# Patient Record
Sex: Female | Born: 1980
Health system: Southern US, Community
[De-identification: ages and names within clinical notes are randomized; demographics above are authoritative.]

## PROBLEM LIST (undated history)

## (undated) DIAGNOSIS — F419 Anxiety disorder, unspecified: Secondary | ICD-10-CM

## (undated) DIAGNOSIS — K8681 Exocrine pancreatic insufficiency: Secondary | ICD-10-CM

## (undated) DIAGNOSIS — M069 Rheumatoid arthritis, unspecified: Secondary | ICD-10-CM

## (undated) DIAGNOSIS — R0602 Shortness of breath: Secondary | ICD-10-CM

## (undated) DIAGNOSIS — E785 Hyperlipidemia, unspecified: Secondary | ICD-10-CM

## (undated) DIAGNOSIS — R5383 Other fatigue: Secondary | ICD-10-CM

## (undated) DIAGNOSIS — M1990 Unspecified osteoarthritis, unspecified site: Secondary | ICD-10-CM

## (undated) DIAGNOSIS — R7303 Prediabetes: Secondary | ICD-10-CM

## (undated) DIAGNOSIS — G4730 Sleep apnea, unspecified: Secondary | ICD-10-CM

## (undated) DIAGNOSIS — D649 Anemia, unspecified: Secondary | ICD-10-CM

## (undated) DIAGNOSIS — E1159 Type 2 diabetes mellitus with other circulatory complications: Secondary | ICD-10-CM

## (undated) DIAGNOSIS — I10 Essential (primary) hypertension: Secondary | ICD-10-CM

## (undated) DIAGNOSIS — M7989 Other specified soft tissue disorders: Secondary | ICD-10-CM

## (undated) DIAGNOSIS — B019 Varicella without complication: Secondary | ICD-10-CM

## (undated) DIAGNOSIS — E559 Vitamin D deficiency, unspecified: Secondary | ICD-10-CM

## (undated) DIAGNOSIS — I152 Hypertension secondary to endocrine disorders: Secondary | ICD-10-CM

## (undated) DIAGNOSIS — F329 Major depressive disorder, single episode, unspecified: Secondary | ICD-10-CM

## (undated) DIAGNOSIS — F32A Depression, unspecified: Secondary | ICD-10-CM

## (undated) DIAGNOSIS — G4733 Obstructive sleep apnea (adult) (pediatric): Secondary | ICD-10-CM

## (undated) DIAGNOSIS — IMO0002 Reserved for concepts with insufficient information to code with codable children: Secondary | ICD-10-CM

## (undated) DIAGNOSIS — M549 Dorsalgia, unspecified: Secondary | ICD-10-CM

## (undated) HISTORY — DX: Rheumatoid arthritis, unspecified: M06.9

## (undated) HISTORY — DX: Depression, unspecified: F32.A

## (undated) HISTORY — PX: OOPHORECTOMY: SHX86

## (undated) HISTORY — DX: Unspecified osteoarthritis, unspecified site: M19.90

## (undated) HISTORY — DX: Shortness of breath: R06.02

## (undated) HISTORY — DX: Prediabetes: R73.03

## (undated) HISTORY — DX: Sleep apnea, unspecified: G47.30

## (undated) HISTORY — DX: Other specified soft tissue disorders: M79.89

## (undated) HISTORY — DX: Anxiety disorder, unspecified: F41.9

## (undated) HISTORY — DX: Obstructive sleep apnea (adult) (pediatric): G47.33

## (undated) HISTORY — DX: Vitamin D deficiency, unspecified: E55.9

## (undated) HISTORY — DX: Other fatigue: R53.83

## (undated) HISTORY — DX: Exocrine pancreatic insufficiency: K86.81

## (undated) HISTORY — DX: Hyperlipidemia, unspecified: E78.5

## (undated) HISTORY — DX: Hypertension secondary to endocrine disorders: I15.2

## (undated) HISTORY — DX: Type 2 diabetes mellitus with other circulatory complications: E11.59

## (undated) HISTORY — PX: ABDOMINAL HYSTERECTOMY: SHX81

## (undated) HISTORY — DX: Anemia, unspecified: D64.9

## (undated) HISTORY — DX: Varicella without complication: B01.9

## (undated) HISTORY — PX: ROUX-EN-Y GASTRIC BYPASS: SHX1104

## (undated) HISTORY — DX: Major depressive disorder, single episode, unspecified: F32.9

## (undated) HISTORY — DX: Dorsalgia, unspecified: M54.9

## (undated) HISTORY — PX: TONSILLECTOMY: SUR1361

---

## 2002-03-21 ENCOUNTER — Encounter: Payer: Self-pay | Admitting: Family Medicine

## 2002-03-21 ENCOUNTER — Ambulatory Visit (HOSPITAL_COMMUNITY): Admission: RE | Admit: 2002-03-21 | Discharge: 2002-03-21 | Payer: Self-pay | Admitting: Family Medicine

## 2004-02-26 ENCOUNTER — Ambulatory Visit: Payer: Self-pay | Admitting: Family Medicine

## 2004-09-20 ENCOUNTER — Emergency Department (HOSPITAL_COMMUNITY): Admission: EM | Admit: 2004-09-20 | Discharge: 2004-09-20 | Payer: Self-pay | Admitting: Emergency Medicine

## 2005-04-09 ENCOUNTER — Emergency Department: Payer: Self-pay | Admitting: Emergency Medicine

## 2005-04-10 ENCOUNTER — Other Ambulatory Visit: Payer: Self-pay

## 2005-04-17 ENCOUNTER — Ambulatory Visit: Payer: Self-pay | Admitting: Internal Medicine

## 2006-09-28 ENCOUNTER — Emergency Department: Payer: Self-pay | Admitting: Unknown Physician Specialty

## 2008-04-22 ENCOUNTER — Ambulatory Visit: Payer: Self-pay | Admitting: Surgery

## 2010-03-25 ENCOUNTER — Ambulatory Visit: Payer: Self-pay | Admitting: Internal Medicine

## 2010-03-29 ENCOUNTER — Encounter (HOSPITAL_COMMUNITY): Payer: Self-pay | Admitting: Radiology

## 2010-03-29 ENCOUNTER — Emergency Department (HOSPITAL_COMMUNITY): Payer: 59

## 2010-03-29 ENCOUNTER — Emergency Department (HOSPITAL_COMMUNITY)
Admission: EM | Admit: 2010-03-29 | Discharge: 2010-03-30 | Disposition: A | Discharge status: Home / Self Care | Payer: 59 | Attending: Emergency Medicine | Admitting: Emergency Medicine

## 2010-03-29 DIAGNOSIS — E876 Hypokalemia: Secondary | ICD-10-CM | POA: Insufficient documentation

## 2010-03-29 DIAGNOSIS — R197 Diarrhea, unspecified: Secondary | ICD-10-CM | POA: Insufficient documentation

## 2010-03-29 DIAGNOSIS — I517 Cardiomegaly: Secondary | ICD-10-CM | POA: Insufficient documentation

## 2010-03-29 DIAGNOSIS — R1011 Right upper quadrant pain: Secondary | ICD-10-CM | POA: Insufficient documentation

## 2010-03-29 DIAGNOSIS — R110 Nausea: Secondary | ICD-10-CM | POA: Insufficient documentation

## 2010-03-29 DIAGNOSIS — R1909 Other intra-abdominal and pelvic swelling, mass and lump: Secondary | ICD-10-CM | POA: Insufficient documentation

## 2010-03-29 DIAGNOSIS — R109 Unspecified abdominal pain: Secondary | ICD-10-CM | POA: Insufficient documentation

## 2010-03-29 DIAGNOSIS — Z9884 Bariatric surgery status: Secondary | ICD-10-CM | POA: Insufficient documentation

## 2010-03-29 DIAGNOSIS — K5900 Constipation, unspecified: Secondary | ICD-10-CM | POA: Insufficient documentation

## 2010-03-29 HISTORY — DX: Reserved for concepts with insufficient information to code with codable children: IMO0002

## 2010-03-29 LAB — CBC
HCT: 24.2 % — ABNORMAL LOW (ref 36.0–46.0)
Hemoglobin: 6.8 g/dL — CL (ref 12.0–15.0)
MCH: 15.9 pg — ABNORMAL LOW (ref 26.0–34.0)
MCHC: 28.1 g/dL — ABNORMAL LOW (ref 30.0–36.0)
MCV: 56.7 fL — ABNORMAL LOW (ref 78.0–100.0)
Platelets: 316 10*3/uL (ref 150–400)
RBC: 4.27 MIL/uL (ref 3.87–5.11)
RDW: 23.6 % — ABNORMAL HIGH (ref 11.5–15.5)

## 2010-03-29 LAB — DIFFERENTIAL
Basophils Absolute: 0 10*3/uL (ref 0.0–0.1)
Basophils Relative: 0 % (ref 0–1)
Eosinophils Absolute: 0 10*3/uL (ref 0.0–0.7)
Lymphocytes Relative: 18 % (ref 12–46)
Lymphs Abs: 1.6 10*3/uL (ref 0.7–4.0)
Monocytes Absolute: 1.1 10*3/uL — ABNORMAL HIGH (ref 0.1–1.0)
Monocytes Relative: 13 % — ABNORMAL HIGH (ref 3–12)
Neutro Abs: 6.1 10*3/uL (ref 1.7–7.7)
Neutrophils Relative %: 69 % (ref 43–77)

## 2010-03-29 LAB — URINALYSIS, ROUTINE W REFLEX MICROSCOPIC
Glucose, UA: NEGATIVE mg/dL
Ketones, ur: >80 mg/dL — AB
Nitrite: POSITIVE — AB
Protein, ur: NEGATIVE mg/dL

## 2010-03-29 LAB — COMPREHENSIVE METABOLIC PANEL
Albumin: 3.7 g/dL (ref 3.5–5.2)
Alkaline Phosphatase: 52 U/L (ref 39–117)
BUN: 6 mg/dL (ref 6–23)
Chloride: 103 mEq/L (ref 96–112)
Potassium: 2.7 mEq/L — CL (ref 3.5–5.1)
Total Bilirubin: 0.8 mg/dL (ref 0.3–1.2)

## 2010-03-29 LAB — POCT PREGNANCY, URINE: Preg Test, Ur: NEGATIVE

## 2010-03-29 LAB — URINE MICROSCOPIC-ADD ON

## 2010-04-10 ENCOUNTER — Ambulatory Visit: Payer: Self-pay | Admitting: Internal Medicine

## 2010-05-10 ENCOUNTER — Ambulatory Visit: Payer: Self-pay | Admitting: Internal Medicine

## 2010-06-10 ENCOUNTER — Ambulatory Visit: Payer: Self-pay | Admitting: Internal Medicine

## 2010-06-20 ENCOUNTER — Inpatient Hospital Stay (HOSPITAL_COMMUNITY): Payer: 59

## 2010-06-20 ENCOUNTER — Inpatient Hospital Stay (HOSPITAL_COMMUNITY)
Admission: AD | Admit: 2010-06-20 | Discharge: 2010-06-20 | Disposition: A | Discharge status: Home / Self Care | Payer: 59 | Source: Ambulatory Visit | Attending: Obstetrics & Gynecology | Admitting: Obstetrics & Gynecology

## 2010-06-20 DIAGNOSIS — O34599 Maternal care for other abnormalities of gravid uterus, unspecified trimester: Secondary | ICD-10-CM | POA: Insufficient documentation

## 2010-06-20 DIAGNOSIS — O10019 Pre-existing essential hypertension complicating pregnancy, unspecified trimester: Secondary | ICD-10-CM | POA: Insufficient documentation

## 2010-06-20 DIAGNOSIS — O209 Hemorrhage in early pregnancy, unspecified: Secondary | ICD-10-CM | POA: Insufficient documentation

## 2010-06-20 DIAGNOSIS — N83209 Unspecified ovarian cyst, unspecified side: Secondary | ICD-10-CM | POA: Insufficient documentation

## 2010-06-20 LAB — WET PREP, GENITAL

## 2010-06-20 LAB — CBC
HCT: 35 % — ABNORMAL LOW (ref 36.0–46.0)
Hemoglobin: 10.8 g/dL — ABNORMAL LOW (ref 12.0–15.0)
RBC: 4.89 MIL/uL (ref 3.87–5.11)
WBC: 6.2 10*3/uL (ref 4.0–10.5)

## 2010-06-20 LAB — ABO/RH: ABO/RH(D): O POS

## 2010-06-20 LAB — POCT PREGNANCY, URINE: Preg Test, Ur: POSITIVE

## 2010-06-21 LAB — GC/CHLAMYDIA PROBE AMP, GENITAL
Chlamydia, DNA Probe: NEGATIVE
GC Probe Amp, Genital: NEGATIVE

## 2010-06-23 ENCOUNTER — Inpatient Hospital Stay (HOSPITAL_COMMUNITY)
Admission: AD | Admit: 2010-06-23 | Discharge: 2010-06-23 | Disposition: A | Discharge status: Home / Self Care | Payer: 59 | Source: Ambulatory Visit | Attending: Family Medicine | Admitting: Family Medicine

## 2010-06-23 DIAGNOSIS — O10019 Pre-existing essential hypertension complicating pregnancy, unspecified trimester: Secondary | ICD-10-CM | POA: Insufficient documentation

## 2010-06-23 DIAGNOSIS — O209 Hemorrhage in early pregnancy, unspecified: Secondary | ICD-10-CM

## 2010-06-23 LAB — HCG, QUANTITATIVE, PREGNANCY: hCG, Beta Chain, Quant, S: 5374 m[IU]/mL — ABNORMAL HIGH (ref <5)

## 2010-07-10 ENCOUNTER — Ambulatory Visit: Payer: Self-pay | Admitting: Internal Medicine

## 2010-07-15 ENCOUNTER — Ambulatory Visit (HOSPITAL_COMMUNITY)
Admission: RE | Admit: 2010-07-15 | Discharge: 2010-07-15 | Disposition: A | Discharge status: Home / Self Care | Payer: 59 | Source: Ambulatory Visit | Attending: Obstetrics and Gynecology | Admitting: Obstetrics and Gynecology

## 2010-07-15 ENCOUNTER — Other Ambulatory Visit: Payer: Self-pay | Admitting: Obstetrics and Gynecology

## 2010-07-15 DIAGNOSIS — O021 Missed abortion: Secondary | ICD-10-CM | POA: Insufficient documentation

## 2010-07-15 LAB — DIFFERENTIAL
Basophils Relative: 0 % (ref 0–1)
Eosinophils Absolute: 0 10*3/uL (ref 0.0–0.7)
Neutrophils Relative %: 48 % (ref 43–77)

## 2010-07-15 LAB — COMPREHENSIVE METABOLIC PANEL
ALT: 13 U/L (ref 0–35)
AST: 10 U/L (ref 0–37)
Albumin: 3.3 g/dL — ABNORMAL LOW (ref 3.5–5.2)
CO2: 28 mEq/L (ref 19–32)
Calcium: 8.6 mg/dL (ref 8.4–10.5)
Chloride: 102 mEq/L (ref 96–112)
GFR calc non Af Amer: >60 mL/min (ref >60)
Sodium: 137 mEq/L (ref 135–145)
Total Bilirubin: 0.3 mg/dL (ref 0.3–1.2)

## 2010-07-15 LAB — CBC
Platelets: 152 10*3/uL (ref 150–400)
RBC: 4.6 MIL/uL (ref 3.87–5.11)
RDW: 18.6 % — ABNORMAL HIGH (ref 11.5–15.5)
WBC: 4.4 10*3/uL (ref 4.0–10.5)

## 2010-08-10 ENCOUNTER — Ambulatory Visit: Payer: Self-pay | Admitting: Internal Medicine

## 2010-08-10 DEATH — deceased

## 2010-08-11 NOTE — Op Note (Signed)
Maria Quinn, Maria Quinn                ACCOUNT NO.:  [id number]  MEDICAL RECORD NO.:  [id number]  LOCATION:  [facility]                          [facility]ACILITY:  [facility]  PHYSICIAN:  Malachi Pro. Ambrose Mantle, M.D. DATE OF BIRTH:  Oct 29, 1980  DATE OF PROCEDURE: DATE OF DISCHARGE:                              OPERATIVE REPORT   PREOPERATIVE DIAGNOSIS:  Nonviable intrauterine pregnancy.  POSTOPERATIVE DIAGNOSIS:  Nonviable intrauterine pregnancy.  OPERATION:  Suction D and C.  OPERATOR:  Malachi Pro. Ambrose Mantle, MD  ANESTHESIA:  MAC anesthesia with local to the cervicovaginal junction.  This patient had an intrauterine gestational sac of 5 weeks' on June 20, 2010.  Subsequent ultrasounds showed and embryo at 6 weeks or 6 weeks and 2 days and on the day prior to admission, July 14, 2010, the embryo still measured 6 weeks and 2 days.  No fetal heart rate.  The patient was brought to the operating room and placed under MAC anesthesia.  She was placed in the [place] stirrups in the semi-lithotomy position.  The vagina was prepped with Betadine solution as well as the medial thighs and perineum and pubic area.  The bladder was emptied with a [place] catheter after the urethra was prepped.  Cervix was visualized, grasped with a single-tooth tenaculum and sounded to 10 cm markedly anteriorly. Exam had shown the uterus to be markedly anterior, 8-9 weeks' size.  The adnexa were free of masses.  The cervical canal was then dilated progressively up to a 27 Pratt dilator and then the #8 curved suction curette was inserted into the endometrial cavity and a suction D and C was done.  The tissue was primarily on the anterior wall of the uterus, failed to receive any more tissue through the suction.  I used sharp instruments to try to curette the walls to make sure there was smooth. The uterus was so anteriorly placed that it was somewhat difficult to get the instruments in and scrape.  So, I did a couple more suction circuits with  the #8 curved suction curette, received no more tissue, and the procedure was terminated.  The patient seemed to tolerate the procedure well.  Blood loss was less than 100 mL.  Most of the blood loss actually came from the injection sites that I used for 1% Xylocaine at the cervicovaginal junction, so the blood loss from the procedure was actually very minimal.  The patient's blood type is O positive and she was returned to recovery in satisfactory condition.     Malachi Pro. Ambrose Mantle, M.D.     TFH/MEDQ  D:  07/15/2010  T:  07/15/2010  Job:  [id number]  Electronically Signed by Tracey Harries M.D. on 08/11/2010 08:19:00 AM

## 2010-09-10 ENCOUNTER — Ambulatory Visit: Payer: Self-pay | Admitting: Internal Medicine

## 2010-10-10 ENCOUNTER — Ambulatory Visit: Payer: Self-pay | Admitting: Internal Medicine

## 2010-10-19 ENCOUNTER — Emergency Department (HOSPITAL_COMMUNITY): Payer: 59

## 2010-10-19 ENCOUNTER — Encounter (HOSPITAL_COMMUNITY): Payer: Self-pay

## 2010-10-19 ENCOUNTER — Inpatient Hospital Stay (HOSPITAL_COMMUNITY)
Admission: EM | Admit: 2010-10-19 | Discharge: 2010-11-07 | DRG: 854 | Disposition: A | Discharge status: Home Health | Payer: 59 | Attending: Obstetrics and Gynecology | Admitting: Obstetrics and Gynecology

## 2010-10-19 DIAGNOSIS — E876 Hypokalemia: Secondary | ICD-10-CM | POA: Diagnosis not present

## 2010-10-19 DIAGNOSIS — D509 Iron deficiency anemia, unspecified: Secondary | ICD-10-CM | POA: Diagnosis present

## 2010-10-19 DIAGNOSIS — R188 Other ascites: Secondary | ICD-10-CM | POA: Diagnosis present

## 2010-10-19 DIAGNOSIS — A419 Sepsis, unspecified organism: Principal | ICD-10-CM | POA: Diagnosis present

## 2010-10-19 DIAGNOSIS — R0902 Hypoxemia: Secondary | ICD-10-CM | POA: Diagnosis present

## 2010-10-19 DIAGNOSIS — I10 Essential (primary) hypertension: Secondary | ICD-10-CM | POA: Diagnosis present

## 2010-10-19 DIAGNOSIS — N7093 Salpingitis and oophoritis, unspecified: Secondary | ICD-10-CM | POA: Diagnosis present

## 2010-10-19 DIAGNOSIS — N731 Chronic parametritis and pelvic cellulitis: Secondary | ICD-10-CM | POA: Diagnosis present

## 2010-10-19 DIAGNOSIS — N839 Noninflammatory disorder of ovary, fallopian tube and broad ligament, unspecified: Secondary | ICD-10-CM | POA: Diagnosis present

## 2010-10-19 DIAGNOSIS — N179 Acute kidney failure, unspecified: Secondary | ICD-10-CM | POA: Diagnosis not present

## 2010-10-19 DIAGNOSIS — R6520 Severe sepsis without septic shock: Secondary | ICD-10-CM | POA: Diagnosis present

## 2010-10-19 DIAGNOSIS — K560 Paralytic ileus: Secondary | ICD-10-CM | POA: Diagnosis present

## 2010-10-19 HISTORY — DX: Essential (primary) hypertension: I10

## 2010-10-19 LAB — URINE MICROSCOPIC-ADD ON

## 2010-10-19 LAB — DIFFERENTIAL
Basophils Absolute: 0 10*3/uL (ref 0.0–0.1)
Basophils Relative: 0 % (ref 0–1)
Lymphocytes Relative: 31 % (ref 12–46)
Lymphs Abs: 2.8 10*3/uL (ref 0.7–4.0)
Monocytes Relative: 8 % (ref 3–12)
Neutro Abs: 5.5 10*3/uL (ref 1.7–7.7)

## 2010-10-19 LAB — CBC
HCT: 33.8 % — ABNORMAL LOW (ref 36.0–46.0)
Hemoglobin: 10.3 g/dL — ABNORMAL LOW (ref 12.0–15.0)
MCH: 22.6 pg — ABNORMAL LOW (ref 26.0–34.0)
MCHC: 30.5 g/dL (ref 30.0–36.0)
MCV: 74.1 fL — ABNORMAL LOW (ref 78.0–100.0)
RBC: 4.56 MIL/uL (ref 3.87–5.11)

## 2010-10-19 LAB — URINALYSIS, ROUTINE W REFLEX MICROSCOPIC
Glucose, UA: NEGATIVE mg/dL
Ketones, ur: NEGATIVE mg/dL
Specific Gravity, Urine: 1.026 (ref 1.005–1.030)
pH: 5.5 (ref 5.0–8.0)

## 2010-10-19 LAB — SAMPLE TO BLOOD BANK

## 2010-10-19 LAB — COMPREHENSIVE METABOLIC PANEL
AST: 10 U/L (ref 0–37)
Albumin: 2.7 g/dL — ABNORMAL LOW (ref 3.5–5.2)
BUN: 9 mg/dL (ref 6–23)
Calcium: 8.9 mg/dL (ref 8.4–10.5)
Chloride: 103 mEq/L (ref 96–112)
Creatinine, Ser: 0.66 mg/dL (ref 0.50–1.10)
Total Protein: 8 g/dL (ref 6.0–8.3)

## 2010-10-19 LAB — POCT I-STAT, CHEM 8
BUN: 7 mg/dL (ref 6–23)
Creatinine, Ser: 0.7 mg/dL (ref 0.50–1.10)
Glucose, Bld: 130 mg/dL — ABNORMAL HIGH (ref 70–99)
Hemoglobin: 11.2 g/dL — ABNORMAL LOW (ref 12.0–15.0)
Potassium: 3 mEq/L — ABNORMAL LOW (ref 3.5–5.1)

## 2010-10-19 LAB — WET PREP, GENITAL
Clue Cells Wet Prep HPF POC: NONE SEEN
Trich, Wet Prep: NONE SEEN

## 2010-10-19 LAB — LIPASE, BLOOD: Lipase: 10 U/L — ABNORMAL LOW (ref 11–59)

## 2010-10-19 MED ORDER — IOHEXOL 300 MG/ML  SOLN
100.0000 mL | Freq: Once | INTRAMUSCULAR | Status: AC | PRN
  Administered 2010-10-19: 100 mL via INTRAVENOUS

## 2010-10-20 LAB — URINE CULTURE
Colony Count: NO GROWTH
Culture: NO GROWTH

## 2010-10-20 LAB — DIFFERENTIAL
Basophils Absolute: 0 10*3/uL (ref 0.0–0.1)
Basophils Relative: 0 % (ref 0–1)
Basophils Relative: 0 % (ref 0–1)
Eosinophils Absolute: 0 10*3/uL (ref 0.0–0.7)
Eosinophils Relative: 0 % (ref 0–5)
Lymphocytes Relative: 4 % — ABNORMAL LOW (ref 12–46)
Lymphs Abs: 1.1 10*3/uL (ref 0.7–4.0)
Neutro Abs: 20.5 10*3/uL — ABNORMAL HIGH (ref 1.7–7.7)
Neutro Abs: 22.3 10*3/uL — ABNORMAL HIGH (ref 1.7–7.7)
Neutrophils Relative %: 91 % — ABNORMAL HIGH (ref 43–77)

## 2010-10-20 LAB — CBC
HCT: 30.8 % — ABNORMAL LOW (ref 36.0–46.0)
HCT: 31.8 % — ABNORMAL LOW (ref 36.0–46.0)
Hemoglobin: 9.7 g/dL — ABNORMAL LOW (ref 12.0–15.0)
MCH: 22.2 pg — ABNORMAL LOW (ref 26.0–34.0)
MCV: 73.5 fL — ABNORMAL LOW (ref 78.0–100.0)
MCV: 73.1 fL — ABNORMAL LOW (ref 78.0–100.0)
Platelets: 394 10*3/uL (ref 150–400)
RBC: 4.19 MIL/uL (ref 3.87–5.11)
RBC: 4.35 MIL/uL (ref 3.87–5.11)
RDW: 17.8 % — ABNORMAL HIGH (ref 11.5–15.5)
WBC: 22.5 10*3/uL — ABNORMAL HIGH (ref 4.0–10.5)
WBC: 24.5 10*3/uL — ABNORMAL HIGH (ref 4.0–10.5)

## 2010-10-20 LAB — COMPREHENSIVE METABOLIC PANEL
ALT: <5 U/L (ref 0–35)
Alkaline Phosphatase: 55 U/L (ref 39–117)
BUN: 20 mg/dL (ref 6–23)
CO2: 23 mEq/L (ref 19–32)
Chloride: 103 mEq/L (ref 96–112)
GFR calc Af Amer: 58 mL/min — ABNORMAL LOW (ref >90)
GFR calc non Af Amer: 50 mL/min — ABNORMAL LOW (ref >90)
Glucose, Bld: 109 mg/dL — ABNORMAL HIGH (ref 70–99)
Potassium: 3.9 mEq/L (ref 3.5–5.1)
Sodium: 136 mEq/L (ref 135–145)
Total Bilirubin: 0.3 mg/dL (ref 0.3–1.2)
Total Protein: 7.5 g/dL (ref 6.0–8.3)

## 2010-10-20 LAB — BASIC METABOLIC PANEL
BUN: 13 mg/dL (ref 6–23)
CO2: 25 mEq/L (ref 19–32)
Calcium: 8.6 mg/dL (ref 8.4–10.5)
Chloride: 105 mEq/L (ref 96–112)
Creatinine, Ser: 0.99 mg/dL (ref 0.50–1.10)

## 2010-10-20 LAB — MAGNESIUM: Magnesium: 2 mg/dL (ref 1.5–2.5)

## 2010-10-21 ENCOUNTER — Inpatient Hospital Stay (HOSPITAL_COMMUNITY): Payer: 59

## 2010-10-21 DIAGNOSIS — N7093 Salpingitis and oophoritis, unspecified: Secondary | ICD-10-CM

## 2010-10-21 LAB — COMPREHENSIVE METABOLIC PANEL
ALT: <5 U/L (ref 0–35)
AST: 10 U/L (ref 0–37)
Alkaline Phosphatase: 56 U/L (ref 39–117)
CO2: 24 mEq/L (ref 19–32)
GFR calc Af Amer: 50 mL/min — ABNORMAL LOW (ref >90)
Glucose, Bld: 96 mg/dL (ref 70–99)
Potassium: 3.9 mEq/L (ref 3.5–5.1)
Sodium: 140 mEq/L (ref 135–145)
Total Protein: 6.9 g/dL (ref 6.0–8.3)

## 2010-10-21 LAB — APTT: aPTT: 35 seconds (ref 24–37)

## 2010-10-21 LAB — PROTIME-INR
Prothrombin Time: 21.5 seconds — ABNORMAL HIGH (ref 11.6–15.2)
Prothrombin Time: 21.6 seconds — ABNORMAL HIGH (ref 11.6–15.2)

## 2010-10-21 LAB — DIFFERENTIAL
Basophils Absolute: 0 10*3/uL (ref 0.0–0.1)
Eosinophils Absolute: 0 10*3/uL (ref 0.0–0.7)
Lymphocytes Relative: 3 % — ABNORMAL LOW (ref 12–46)
Lymphs Abs: 0.8 10*3/uL (ref 0.7–4.0)
Neutro Abs: 23.9 10*3/uL — ABNORMAL HIGH (ref 1.7–7.7)

## 2010-10-21 LAB — CBC
MCH: 22.2 pg — ABNORMAL LOW (ref 26.0–34.0)
MCV: 73.4 fL — ABNORMAL LOW (ref 78.0–100.0)
Platelets: 292 10*3/uL (ref 150–400)
RDW: 17.7 % — ABNORMAL HIGH (ref 11.5–15.5)
WBC: 26 10*3/uL — ABNORMAL HIGH (ref 4.0–10.5)

## 2010-10-21 LAB — ABO/RH: ABO/RH(D): O POS

## 2010-10-21 LAB — TYPE AND SCREEN: Antibody Screen: NEGATIVE

## 2010-10-22 ENCOUNTER — Inpatient Hospital Stay (HOSPITAL_COMMUNITY): Payer: 59

## 2010-10-22 LAB — PREPARE FRESH FROZEN PLASMA: Unit division: 0

## 2010-10-22 LAB — CBC
Hemoglobin: 8.1 g/dL — ABNORMAL LOW (ref 12.0–15.0)
MCH: 22.6 pg — ABNORMAL LOW (ref 26.0–34.0)
RBC: 3.59 MIL/uL — ABNORMAL LOW (ref 3.87–5.11)
RDW: 17.8 % — ABNORMAL HIGH (ref 11.5–15.5)
WBC: 17.2 10*3/uL — ABNORMAL HIGH (ref 4.0–10.5)

## 2010-10-22 LAB — PROTIME-INR
INR: 1.68 — ABNORMAL HIGH (ref 0.00–1.49)
Prothrombin Time: 20.1 seconds — ABNORMAL HIGH (ref 11.6–15.2)

## 2010-10-22 LAB — COMPREHENSIVE METABOLIC PANEL
Albumin: 2.2 g/dL — ABNORMAL LOW (ref 3.5–5.2)
BUN: 16 mg/dL (ref 6–23)
Chloride: 108 mEq/L (ref 96–112)
Creatinine, Ser: 0.91 mg/dL (ref 0.50–1.10)
Total Bilirubin: 0.2 mg/dL — ABNORMAL LOW (ref 0.3–1.2)
Total Protein: 6.7 g/dL (ref 6.0–8.3)

## 2010-10-22 LAB — DIFFERENTIAL
Basophils Absolute: 0 10*3/uL (ref 0.0–0.1)
Basophils Relative: 0 % (ref 0–1)
Eosinophils Absolute: 0 10*3/uL (ref 0.0–0.7)
Neutro Abs: 14.5 10*3/uL — ABNORMAL HIGH (ref 1.7–7.7)
Neutrophils Relative %: 84 % — ABNORMAL HIGH (ref 43–77)

## 2010-10-22 LAB — LACTIC ACID, PLASMA: Lactic Acid, Venous: 0.5 mmol/L (ref 0.5–2.2)

## 2010-10-23 LAB — CBC
HCT: 26.1 % — ABNORMAL LOW (ref 36.0–46.0)
Hemoglobin: 8.1 g/dL — ABNORMAL LOW (ref 12.0–15.0)
MCH: 22.6 pg — ABNORMAL LOW (ref 26.0–34.0)
MCV: 72.7 fL — ABNORMAL LOW (ref 78.0–100.0)
RBC: 3.59 MIL/uL — ABNORMAL LOW (ref 3.87–5.11)

## 2010-10-23 LAB — COMPREHENSIVE METABOLIC PANEL
BUN: 11 mg/dL (ref 6–23)
CO2: 24 mEq/L (ref 19–32)
Calcium: 8.5 mg/dL (ref 8.4–10.5)
Creatinine, Ser: 0.63 mg/dL (ref 0.50–1.10)
GFR calc Af Amer: >90 mL/min (ref >90)
GFR calc non Af Amer: >90 mL/min (ref >90)
Glucose, Bld: 72 mg/dL (ref 70–99)
Total Bilirubin: 0.2 mg/dL — ABNORMAL LOW (ref 0.3–1.2)

## 2010-10-23 LAB — DIFFERENTIAL
Eosinophils Absolute: 0 10*3/uL (ref 0.0–0.7)
Lymphocytes Relative: 8 % — ABNORMAL LOW (ref 12–46)
Lymphs Abs: 1 10*3/uL (ref 0.7–4.0)
Monocytes Relative: 9 % (ref 3–12)
Neutro Abs: 11 10*3/uL — ABNORMAL HIGH (ref 1.7–7.7)
Neutrophils Relative %: 83 % — ABNORMAL HIGH (ref 43–77)

## 2010-10-23 LAB — PROTIME-INR: INR: 1.44 (ref 0.00–1.49)

## 2010-10-23 LAB — CLOSTRIDIUM DIFFICILE BY PCR: Toxigenic C. Difficile by PCR: NEGATIVE

## 2010-10-24 LAB — CBC
MCH: 22.1 pg — ABNORMAL LOW (ref 26.0–34.0)
MCHC: 30.7 g/dL (ref 30.0–36.0)
RDW: 17.9 % — ABNORMAL HIGH (ref 11.5–15.5)

## 2010-10-24 LAB — BASIC METABOLIC PANEL
BUN: 7 mg/dL (ref 6–23)
Calcium: 8.4 mg/dL (ref 8.4–10.5)
GFR calc Af Amer: >90 mL/min (ref >90)
GFR calc non Af Amer: >90 mL/min (ref >90)
Potassium: 2.9 mEq/L — ABNORMAL LOW (ref 3.5–5.1)
Sodium: 141 mEq/L (ref 135–145)

## 2010-10-24 LAB — POTASSIUM: Potassium: 3.3 mEq/L — ABNORMAL LOW (ref 3.5–5.1)

## 2010-10-24 LAB — MAGNESIUM: Magnesium: 1.8 mg/dL (ref 1.5–2.5)

## 2010-10-25 ENCOUNTER — Inpatient Hospital Stay (HOSPITAL_COMMUNITY): Payer: 59

## 2010-10-25 LAB — CBC
HCT: 32 % — ABNORMAL LOW (ref 36.0–46.0)
Platelets: 259 10*3/uL (ref 150–400)
RDW: 18 % — ABNORMAL HIGH (ref 11.5–15.5)
WBC: 23.3 10*3/uL — ABNORMAL HIGH (ref 4.0–10.5)

## 2010-10-25 LAB — BASIC METABOLIC PANEL
Chloride: 105 mEq/L (ref 96–112)
Potassium: 3.5 mEq/L (ref 3.5–5.1)
Sodium: 139 mEq/L (ref 135–145)

## 2010-10-25 LAB — MAGNESIUM: Magnesium: 1.9 mg/dL (ref 1.5–2.5)

## 2010-10-25 MED ORDER — IOHEXOL 300 MG/ML  SOLN
100.0000 mL | Freq: Once | INTRAMUSCULAR | Status: AC | PRN
  Administered 2010-10-25: 100 mL via INTRAVENOUS

## 2010-10-26 ENCOUNTER — Other Ambulatory Visit: Payer: Self-pay | Admitting: Family Medicine

## 2010-10-26 LAB — DIFFERENTIAL
Basophils Absolute: 0 10*3/uL (ref 0.0–0.1)
Eosinophils Absolute: 0.3 10*3/uL (ref 0.0–0.7)
Lymphocytes Relative: 8 % — ABNORMAL LOW (ref 12–46)
Monocytes Relative: 9 % (ref 3–12)
Neutrophils Relative %: 82 % — ABNORMAL HIGH (ref 43–77)

## 2010-10-26 LAB — BASIC METABOLIC PANEL
CO2: 27 mEq/L (ref 19–32)
Calcium: 8.6 mg/dL (ref 8.4–10.5)
Chloride: 103 mEq/L (ref 96–112)
GFR calc Af Amer: >90 mL/min (ref >90)
Sodium: 139 mEq/L (ref 135–145)

## 2010-10-26 LAB — CBC
Platelets: 295 10*3/uL (ref 150–400)
RBC: 4.3 MIL/uL (ref 3.87–5.11)
RDW: 18.3 % — ABNORMAL HIGH (ref 11.5–15.5)
WBC: 26.3 10*3/uL — ABNORMAL HIGH (ref 4.0–10.5)

## 2010-10-26 LAB — CULTURE, ROUTINE-ABSCESS

## 2010-10-26 LAB — CULTURE, BLOOD (ROUTINE X 2)
Culture  Setup Time: 201210110043
Culture  Setup Time: 201210110044
Culture: NO GROWTH

## 2010-10-27 ENCOUNTER — Inpatient Hospital Stay (HOSPITAL_COMMUNITY): Payer: 59

## 2010-10-27 ENCOUNTER — Other Ambulatory Visit: Payer: Self-pay | Admitting: Interventional Radiology

## 2010-10-27 DIAGNOSIS — N7093 Salpingitis and oophoritis, unspecified: Secondary | ICD-10-CM

## 2010-10-27 LAB — COMPREHENSIVE METABOLIC PANEL
ALT: <5 U/L (ref 0–35)
AST: 9 U/L (ref 0–37)
Alkaline Phosphatase: 51 U/L (ref 39–117)
CO2: 28 mEq/L (ref 19–32)
Calcium: 8.7 mg/dL (ref 8.4–10.5)
Chloride: 99 mEq/L (ref 96–112)
GFR calc Af Amer: >90 mL/min (ref >90)
GFR calc non Af Amer: >90 mL/min (ref >90)
Glucose, Bld: 82 mg/dL (ref 70–99)
Potassium: 3.2 mEq/L — ABNORMAL LOW (ref 3.5–5.1)
Sodium: 136 mEq/L (ref 135–145)
Total Bilirubin: 0.4 mg/dL (ref 0.3–1.2)

## 2010-10-27 LAB — CBC
Hemoglobin: 9.7 g/dL — ABNORMAL LOW (ref 12.0–15.0)
MCH: 22.3 pg — ABNORMAL LOW (ref 26.0–34.0)
Platelets: 381 10*3/uL (ref 150–400)
RBC: 4.35 MIL/uL (ref 3.87–5.11)
WBC: 24.2 10*3/uL — ABNORMAL HIGH (ref 4.0–10.5)

## 2010-10-27 LAB — DIFFERENTIAL
Basophils Relative: 0 % (ref 0–1)
Eosinophils Absolute: 0.2 10*3/uL (ref 0.0–0.7)
Eosinophils Relative: 1 % (ref 0–5)
Lymphocytes Relative: 8 % — ABNORMAL LOW (ref 12–46)
Monocytes Absolute: 2.7 10*3/uL — ABNORMAL HIGH (ref 0.1–1.0)
Neutrophils Relative %: 80 % — ABNORMAL HIGH (ref 43–77)

## 2010-10-27 LAB — APTT: aPTT: 36 seconds (ref 24–37)

## 2010-10-28 ENCOUNTER — Other Ambulatory Visit (HOSPITAL_COMMUNITY): Payer: 59

## 2010-10-28 DIAGNOSIS — R109 Unspecified abdominal pain: Secondary | ICD-10-CM

## 2010-10-28 DIAGNOSIS — D72828 Other elevated white blood cell count: Secondary | ICD-10-CM

## 2010-10-28 LAB — CBC
MCV: 70.7 fL — ABNORMAL LOW (ref 78.0–100.0)
Platelets: 425 10*3/uL — ABNORMAL HIGH (ref 150–400)
RBC: 3.99 MIL/uL (ref 3.87–5.11)
RDW: 18.5 % — ABNORMAL HIGH (ref 11.5–15.5)
WBC: 18.5 10*3/uL — ABNORMAL HIGH (ref 4.0–10.5)

## 2010-10-28 LAB — COMPREHENSIVE METABOLIC PANEL
ALT: <5 U/L (ref 0–35)
AST: 11 U/L (ref 0–37)
Albumin: 2.1 g/dL — ABNORMAL LOW (ref 3.5–5.2)
CO2: 29 mEq/L (ref 19–32)
Chloride: 97 mEq/L (ref 96–112)
Creatinine, Ser: 0.59 mg/dL (ref 0.50–1.10)
Sodium: 135 mEq/L (ref 135–145)
Total Bilirubin: 0.2 mg/dL — ABNORMAL LOW (ref 0.3–1.2)

## 2010-10-29 ENCOUNTER — Other Ambulatory Visit: Payer: Self-pay | Admitting: Obstetrics and Gynecology

## 2010-10-29 DIAGNOSIS — K651 Peritoneal abscess: Secondary | ICD-10-CM

## 2010-10-29 LAB — BASIC METABOLIC PANEL
BUN: 3 mg/dL — ABNORMAL LOW (ref 6–23)
CO2: 28 mEq/L (ref 19–32)
Calcium: 8.9 mg/dL (ref 8.4–10.5)
Creatinine, Ser: 0.5 mg/dL (ref 0.50–1.10)
GFR calc Af Amer: >90 mL/min (ref >90)

## 2010-10-29 LAB — CBC
HCT: 31.4 % — ABNORMAL LOW (ref 36.0–46.0)
Platelets: 544 10*3/uL — ABNORMAL HIGH (ref 150–400)
RBC: 4.49 MIL/uL (ref 3.87–5.11)
RDW: 18.5 % — ABNORMAL HIGH (ref 11.5–15.5)
WBC: 21.5 10*3/uL — ABNORMAL HIGH (ref 4.0–10.5)

## 2010-10-29 LAB — PROTIME-INR: Prothrombin Time: 17.7 seconds — ABNORMAL HIGH (ref 11.6–15.2)

## 2010-10-29 LAB — APTT: aPTT: 37 seconds (ref 24–37)

## 2010-10-30 LAB — COMPREHENSIVE METABOLIC PANEL
ALT: <5 U/L (ref 0–35)
AST: 10 U/L (ref 0–37)
Albumin: 1.9 g/dL — ABNORMAL LOW (ref 3.5–5.2)
CO2: 27 mEq/L (ref 19–32)
Calcium: 8.1 mg/dL — ABNORMAL LOW (ref 8.4–10.5)
Creatinine, Ser: 1.02 mg/dL (ref 0.50–1.10)
Sodium: 135 mEq/L (ref 135–145)
Total Protein: 6 g/dL (ref 6.0–8.3)

## 2010-10-30 LAB — GLUCOSE, CAPILLARY: Glucose-Capillary: 127 mg/dL — ABNORMAL HIGH (ref 70–99)

## 2010-10-30 LAB — CBC
MCH: 21.9 pg — ABNORMAL LOW (ref 26.0–34.0)
MCHC: 30.8 g/dL (ref 30.0–36.0)
MCV: 71.3 fL — ABNORMAL LOW (ref 78.0–100.0)
Platelets: 623 10*3/uL — ABNORMAL HIGH (ref 150–400)
RBC: 4.42 MIL/uL (ref 3.87–5.11)
RDW: 18.5 % — ABNORMAL HIGH (ref 11.5–15.5)

## 2010-10-31 ENCOUNTER — Inpatient Hospital Stay (HOSPITAL_COMMUNITY): Payer: 59

## 2010-10-31 LAB — CBC
Hemoglobin: 7.3 g/dL — ABNORMAL LOW (ref 12.0–15.0)
MCH: 22.3 pg — ABNORMAL LOW (ref 26.0–34.0)
MCV: 71.3 fL — ABNORMAL LOW (ref 78.0–100.0)
Platelets: 555 10*3/uL — ABNORMAL HIGH (ref 150–400)
RBC: 3.28 MIL/uL — ABNORMAL LOW (ref 3.87–5.11)
WBC: 27.1 10*3/uL — ABNORMAL HIGH (ref 4.0–10.5)

## 2010-10-31 LAB — COMPREHENSIVE METABOLIC PANEL
ALT: <5 U/L (ref 0–35)
AST: 9 U/L (ref 0–37)
CO2: 29 mEq/L (ref 19–32)
Chloride: 97 mEq/L (ref 96–112)
GFR calc Af Amer: 47 mL/min — ABNORMAL LOW (ref >90)
GFR calc non Af Amer: 41 mL/min — ABNORMAL LOW (ref >90)
Glucose, Bld: 75 mg/dL (ref 70–99)
Sodium: 135 mEq/L (ref 135–145)
Total Bilirubin: 0.3 mg/dL (ref 0.3–1.2)

## 2010-10-31 LAB — URINALYSIS, MICROSCOPIC ONLY
Glucose, UA: NEGATIVE mg/dL
Specific Gravity, Urine: 1.016 (ref 1.005–1.030)
pH: 5.5 (ref 5.0–8.0)

## 2010-10-31 LAB — BODY FLUID CULTURE: Culture: NO GROWTH

## 2010-10-31 LAB — CREATININE, FLUID (PLEURAL, PERITONEAL, JP DRAINAGE)

## 2010-10-31 LAB — GLUCOSE, CAPILLARY: Glucose-Capillary: 121 mg/dL — ABNORMAL HIGH (ref 70–99)

## 2010-11-01 LAB — BODY FLUID CULTURE: Culture: NO GROWTH

## 2010-11-01 LAB — RENAL FUNCTION PANEL
BUN: 17 mg/dL (ref 6–23)
CO2: 28 mEq/L (ref 19–32)
Chloride: 98 mEq/L (ref 96–112)
Creatinine, Ser: 1.41 mg/dL — ABNORMAL HIGH (ref 0.50–1.10)
GFR calc non Af Amer: 49 mL/min — ABNORMAL LOW (ref >90)
Potassium: 4.6 mEq/L (ref 3.5–5.1)

## 2010-11-01 LAB — HEPATIC FUNCTION PANEL
ALT: <5 U/L (ref 0–35)
AST: 14 U/L (ref 0–37)
Alkaline Phosphatase: 53 U/L (ref 39–117)
Bilirubin, Direct: 0.2 mg/dL (ref 0.0–0.3)
Indirect Bilirubin: 0.2 mg/dL — ABNORMAL LOW (ref 0.3–0.9)
Total Bilirubin: 0.4 mg/dL (ref 0.3–1.2)

## 2010-11-01 LAB — COMPREHENSIVE METABOLIC PANEL
ALT: <5 U/L (ref 0–35)
Albumin: 1.8 g/dL — ABNORMAL LOW (ref 3.5–5.2)
Alkaline Phosphatase: 55 U/L (ref 39–117)
BUN: 17 mg/dL (ref 6–23)
Chloride: 98 mEq/L (ref 96–112)
Potassium: 4.7 mEq/L (ref 3.5–5.1)
Sodium: 134 mEq/L — ABNORMAL LOW (ref 135–145)
Total Bilirubin: 0.4 mg/dL (ref 0.3–1.2)
Total Protein: 6.5 g/dL (ref 6.0–8.3)

## 2010-11-01 LAB — PHOSPHORUS: Phosphorus: 4.5 mg/dL (ref 2.3–4.6)

## 2010-11-01 LAB — CBC
HCT: 25.2 % — ABNORMAL LOW (ref 36.0–46.0)
Hemoglobin: 8 g/dL — ABNORMAL LOW (ref 12.0–15.0)
RBC: 3.53 MIL/uL — ABNORMAL LOW (ref 3.87–5.11)
RDW: 18.9 % — ABNORMAL HIGH (ref 11.5–15.5)
WBC: 21.5 10*3/uL — ABNORMAL HIGH (ref 4.0–10.5)

## 2010-11-01 LAB — GLUCOSE, CAPILLARY: Glucose-Capillary: 96 mg/dL (ref 70–99)

## 2010-11-01 NOTE — Progress Notes (Signed)
NAMEPAYZLEIGH, Maria Quinn                ACCOUNT NO.:  [id number]  MEDICAL RECORD NO.:  [id number]  LOCATION:  4743                         FACILITY:  [facility]  PHYSICIAN:  Thad Ranger, MD       DATE OF BIRTH:  [birth year]                                PROGRESS NOTE   CURRENT DIAGNOSES: 1. Tuboovarian abscess, status post CT-guided drain placement. 2. Hypokalemia, aggressively replaced. 3. Hypertension. 4. History of iron deficiency anemia. 5. 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 6. History of miscarriage 4 months ago by gestational age of [redacted] weeks     and status post dilation and curettage. 7. Diarrhea, improved. 8. Sepsis secondary to tuboovarian abscess, improved.  CONSULTATIONS: 1. Infectious Disease, Dr. Johny Sax. 2. Interventional Radiology, Dr. Ambrose Mantle. 3. Gynecology.  BRIEF HISTORY OF PRESENT ILLNESS AT THE TIME OF ADMISSION: Maria Quinn is a 30 year old female who has a history of gastric bypass surgery, Roux-en-Y in 2010, history of recent miscarriage 4 months ago status post D and C who presented with abdominal pain and near syncope episode 12 hours prior to admission.  The patient states that she was at work and felt syncopal and her coworkers called the EMS and the patient was brought to the emergency room.  In the hospital, the patient had recorded temperature of 103 with intense abdominal pain and the patient was admitted.  RADIOLOGICAL DATA: Transabdominal and transvaginal ultrasound of the pelvis with Doppler ultrasound of the ovaries on October 19, 2010, showed 10.3 cm complex cystic mass superior to the uterus of unknown etiology.  Could possibly represent a tuboovarian abscess or other non-gynecological abscess. Since neither ovaries cannot be visualized, cannot exclude cystic ovarian process.  CT of abdomen and pelvis with contrast on October 19, 2010, showed complex cystic mass superior and anterior to the uterus 9.9 x 7.4 x 8.5 cm in  size, suspect ovarian origin, highly suspicious for ovarian neoplasm, significant low attenuation, ascites.  Chest x-ray one view on October 19, 2010, showed bilateral fluffy perihilar opacities, worsening aeration compared with prior chest x-ray on October 22, 2010, low lung volumes with probable bibasilar atelectasis.  CT-guided drainage on October 24, 2010, status post drain placement.  PERTINENT LAB AND DIAGNOSTIC DATA SO FAR: HIV negative.  C. diff via PCR negative.  Abscess culture showed few gram-positive cocci in pairs, however, preliminary.  BRIEF HOSPITALIZATION COURSE SO FAR: Maria Quinn is a 30 year old female who was admitted with intense abdominal pain and fevers.  Initially, the patient was thought to have community-acquired pneumonia, however, abdominal and pelvic ultrasound along with CT of abdomen and pelvis were consistent with possible tuboovarian abscess. 1. Abdominal pain with sepsis secondary to tuboovarian abscess.  On     October 20, 2010, the patient was found to be febrile with temp of     101 with white count of 22.5.  At this point, CT of abdomen and     pelvis and abdominal and pelvic ultrasound were suspicious for     tuboovarian abscess.  The patient was transferred to Step-Down Unit     and gynecology consult was also obtained.  Gynecology discussed     with  the patient in detail.  The patient had a prior oophorectomy     and she tended to avoid surgical removal of the other ovary, hence     Interventional Radiology was consulted.  The patient underwent CT-     guided abscess drainage on October 24, 2010.  Fluid culture final     results are still pending.  The patient is currently on     doxycycline, cefoxitin, and metronidazole per the Infectious     Disease recommendations.  The patient overall has been improving     and was transferred out of the Step-Down Unit on October 23, 2010.     This morning, the patient is complaining of increasing  abdominal     pain and the white count has trended up to 23.3, hence repeat     imaging will be obtained and we will also obtain C. diff .  The     diarrhea is improving. 2. Hypertension.  The patient was started on Norvasc.  We will also     add p.r.n. hydralazine.  PHYSICAL EXAM AT THE TIME OF THE DICTATION: VITAL SIGNS:  Temperature 98.8, pulse 86, respiration 18, and blood pressure 165/111. GENERAL:  The patient is alert, awake, and oriented, not in any acute distress. HEENT:  Anicteric sclerae and clear conjunctivae.  Pupils are reactive to light and accommodation.  EOMI. CARDIOVASCULAR:  S1 and S2 clear. LUNGS:  Clear to auscultation bilaterally. ABDOMEN:  Diffuse mild tenderness to palpation.  Drainage site appears to be clean.  No warmth, erythema, or swelling.  Drain present.  Normal bowel sounds. EXTREMITIES:  No edema.     Thad Ranger, MD     RR/MEDQ  D:  10/25/2010  T:  10/25/2010  Job:  [id number]  cc:   Malachi Pro. Ambrose Mantle, M.D. Fax: [phone]  Dr. Lennox Pippins C. Ninetta Lights, M.D. Fax: [phone]  Electronically Signed by Daniell Paradise  on 11/01/2010 01:49:07 PM

## 2010-11-02 DIAGNOSIS — N7093 Salpingitis and oophoritis, unspecified: Secondary | ICD-10-CM

## 2010-11-02 LAB — CBC
MCH: 22.3 pg — ABNORMAL LOW (ref 26.0–34.0)
MCHC: 31.1 g/dL (ref 30.0–36.0)
Platelets: 569 10*3/uL — ABNORMAL HIGH (ref 150–400)
RDW: 19 % — ABNORMAL HIGH (ref 11.5–15.5)

## 2010-11-02 LAB — TYPE AND SCREEN
ABO/RH(D): O POS
Unit division: 0

## 2010-11-02 LAB — RENAL FUNCTION PANEL
Albumin: 1.8 g/dL — ABNORMAL LOW (ref 3.5–5.2)
Calcium: 8.5 mg/dL (ref 8.4–10.5)
GFR calc Af Amer: 72 mL/min — ABNORMAL LOW (ref >90)
Glucose, Bld: 67 mg/dL — ABNORMAL LOW (ref 70–99)
Phosphorus: 3.8 mg/dL (ref 2.3–4.6)
Potassium: 4.5 mEq/L (ref 3.5–5.1)
Sodium: 135 mEq/L (ref 135–145)

## 2010-11-02 LAB — ANAEROBIC CULTURE

## 2010-11-02 NOTE — Consult Note (Signed)
Maria Quinn, SYLVIA NO.:  [id number]  MEDICAL RECORD NO.:  [id number]  LOCATION:  2923                         FACILITY:  [facility]  PHYSICIAN:  Gardiner Barefoot, MD    DATE OF BIRTH:  1980-12-09  DATE OF CONSULTATION: DATE OF DISCHARGE:                                CONSULTATION   REASON FOR CONSULTATION:  Antibiotic management.  HISTORY OF PRESENT ILLNESS:  This is a 30 year old female with history of gastric bypass surgery, who came in with abdominal pain that was fairly rapid in onset and was described as a 10/10.  There was no ameliorating factors to her pain and brought in by EMS due to significant pain.  It was noted that she had fever up to 103 and did have a near-syncopal episode.  She never had a history of this in the past and CT scan was concerning for tubo-ovarian abscess versus mass. She denies any history of IUD placement, no history of GC or Chlamydia, and a remote history of left ovary removal for unknown reason.  She did have some nausea and vomiting.  No other sick contacts.  PAST MEDICAL HISTORY: 1. Hypertension. 2. Iron-deficiency anemia secondary to her gastric bypass. 3. History of Roux-en-Y in 2010. 4. History of miscarriage.  MEDICATIONS:  Currently, she is on cefoxitin, doxycycline, and Flagyl.  ALLERGIES:  PENICILLIN.  SOCIAL HISTORY:  The patient is single.  Denies any recent tobacco use, occasional alcohol, and denies drug use.  FAMILY HISTORY:  Both father and mother with hypertension and diabetes.  REVIEW OF SYSTEMS:  Twelve-point review of systems negative except as per the history of present illness.  PHYSICAL EXAMINATION:  VITAL SIGNS:  Temperature is 100.7, pulse is 97, blood pressure is 107/61, and O2 sats 96% on 2 L nasal cannula. GENERAL:  The patient is awake and alert and appears in mild distress due to pain. CARDIOVASCULAR:  Tachycardic.  Regular rhythm.  No murmurs, rubs, or gallops. LUNGS:  Clear to  auscultation bilaterally. ABDOMEN:  Diffusely tender, but soft and no rebound or guarding. EXTREMITIES:  No edema.  LABORATORY DATA:  WBC is 26.0 which is up from admission of 9.  There is 92% neutrophils.  Blood cultures remain negative.  Creatinine is 1.56, which is up from her baseline on admission.  Urine culture and urine GC and Chlamydia are all negative.  INR is 1.84.  Pregnancy is negative. CT scan of the abdomen does show a complex cystic mass superior and anterior to the uterus concerning for neoplasm versus tubo-ovarian abscess.  Chest x-ray shows some signs consistent with pulmonary edema.  ASSESSMENT/PLAN:  Tubo-ovarian abscess.  With a high white count and the CT scan findings it is concerning for tubo-ovarian abscess rather than neoplasm, however, definitive diagnosis with drainage is being pursued. At this time, she is on appropriate antibiotic therapy and this can be continued.  Cultures can be monitored, but I suspect this will be polymicrobial if it is an abscess.  She also will have an HIV antibody checked, otherwise we will monitor the patient.  Thank you for the consult.     Gardiner Barefoot, MD  RWC/MEDQ  D:  10/21/2010  T:  10/21/2010  Job:  [id number]  Electronically Signed by Staci Righter MD on 11/02/2010 05:16:32 PM

## 2010-11-03 LAB — COMPREHENSIVE METABOLIC PANEL
AST: 10 U/L (ref 0–37)
Albumin: 2 g/dL — ABNORMAL LOW (ref 3.5–5.2)
Alkaline Phosphatase: 50 U/L (ref 39–117)
BUN: 13 mg/dL (ref 6–23)
Chloride: 99 mEq/L (ref 96–112)
Creatinine, Ser: 0.93 mg/dL (ref 0.50–1.10)
Potassium: 4 mEq/L (ref 3.5–5.1)
Total Protein: 6.8 g/dL (ref 6.0–8.3)

## 2010-11-03 LAB — CBC
HCT: 24.2 % — ABNORMAL LOW (ref 36.0–46.0)
Hemoglobin: 7.5 g/dL — ABNORMAL LOW (ref 12.0–15.0)
MCH: 22.2 pg — ABNORMAL LOW (ref 26.0–34.0)
MCHC: 31 g/dL (ref 30.0–36.0)
MCV: 71.6 fL — ABNORMAL LOW (ref 78.0–100.0)
Platelets: 583 10*3/uL — ABNORMAL HIGH (ref 150–400)
RBC: 3.38 MIL/uL — ABNORMAL LOW (ref 3.87–5.11)
RDW: 19.3 % — ABNORMAL HIGH (ref 11.5–15.5)
WBC: 14.5 10*3/uL — ABNORMAL HIGH (ref 4.0–10.5)

## 2010-11-03 NOTE — Op Note (Signed)
[name], THEIN NO.:  [id number]  MEDICAL RECORD NO.:  [id number]  LOCATION:  4743                         FACILITY:  [facility]  PHYSICIAN:  Abigail Miyamoto, M.D. DATE OF BIRTH:  Mar 01, 1980  DATE OF PROCEDURE:  10/29/2010 DATE OF DISCHARGE:                              OPERATIVE REPORT   PREOPERATIVE DIAGNOSIS:  Intraabdominal abscess.  POSTOPERATIVE DIAGNOSIS:  Intraabdominal abscess.  PROCEDURES: 1. Exploratory laparotomy. 2. Mostly loculated intraabdominal abscess. 3. Hysterectomy.  SURGEONS:  Abigail Miyamoto, MD.  CO-SURGEON:  Dr. Ambrose Mantle.  ANESTHESIA:  General endotracheal anesthesia.  ESTIMATED BLOOD LOSS:  400 mL.  INDICATION:  This is a 30 year old female who presented with acute abdominal pain.  She had a CAT scan of the abdomen and pelvis, which suggested a possible tubo-ovarian abscess.  She continued to have multiple fluid collections in her abdomen.  She had a mildly elevated CA- 125.  She had ascites fluid aspirated which showed no evidence of malignancy.  Because of her continued increase in white blood count, Dr. Ambrose Mantle decided that the patient needs to proceed to the operating room for exploratory laparotomy.  FINDINGS:  The patient had a large amount of intraabdominal abscess and ascites, mostly the abscess was located in the left side of the abdomen, going from the spleen down the pelvis.  There was a large amount of fibrinous exudate.  There are also multiple interloop fluid collections as well.  The bowel did not appear to be a source of the abscess.  The small bowel had a large amount of fibrinous exudate but no evidence of perforation in the colon as well appeared.  Otherwise, normal except for the large amount of fibrinous exudate inflammatory process.  The hysterectomy was performed by Dr. Ambrose Mantle.  Some pelvic inflammatory tissue on left side was removed as well.  PROCEDURE IN DETAIL:  The patient was brought to the  operating, identified as Maria Quinn.  She is placed supine on the operating room table.  General anesthesia was induced.  The patient was then prepped and draped in usual sterile fashion.  Midline incision was then created through the patient's previous scar.  This was taken down through the fascia with electrocautery.  The peritoneum was then opened entirely through the incision.  Upon entering the abdomen, the patient had a large amount of seropurulent fluid and ascites.  There is large amount fibrinous exudate located in the left side of the abdomen.  This occurred from the area of the spleen all the way down into the pelvis. I had to eviscerate the small bowel after breaking up loculated fluid collections.  There were no large intraabdominal adhesions.  The majority of the process appeared to be involved in the pelvis.  Again, small bowel and colon were covered with fibrinous exudate.  The collections had no odor and again I could not identify any source coming from the bowel as source of the abscess.  At this point, once all fluid collections were drained, Dr. Ambrose Mantle proceeded with hysterectomy.  The left pelvic sidewall was completely stuck to the colon.  It was difficult to identify any structures in this area.  There appeared to be a  cystic structure going adjacent to the colon which was opened up.  We did take some of the inflammatory pelvic tissue on the left sidewall off and sent to pathology for evaluation.  There did not appear to be any evidence of malignancy in the abdomen.  Once Dr. Ambrose Mantle completed the hysterectomy, I placed two 19-French Blake drains into the patient's left side of the abdomen and another down into the pelvis on the right side of the abdomen.  These were sewn in place.  Nylon sutures placed to bulb suction.  At this point, Dr. Ambrose Mantle then proceeded with closure of the abdomen.  All sponge, needle, and instrument counts correct at the end of  procedure.     Abigail Miyamoto, M.D.     DB/MEDQ  D:  10/29/2010  T:  10/29/2010  Job:  [id number]  Electronically Signed by Abigail Miyamoto M.D. on 11/03/2010 12:36:44 PM

## 2010-11-04 LAB — CBC
HCT: 24.1 % — ABNORMAL LOW (ref 36.0–46.0)
MCH: 22.5 pg — ABNORMAL LOW (ref 26.0–34.0)
MCV: 72.2 fL — ABNORMAL LOW (ref 78.0–100.0)
RBC: 3.34 MIL/uL — ABNORMAL LOW (ref 3.87–5.11)
WBC: 12.9 10*3/uL — ABNORMAL HIGH (ref 4.0–10.5)

## 2010-11-05 ENCOUNTER — Inpatient Hospital Stay (HOSPITAL_COMMUNITY): Payer: 59

## 2010-11-05 LAB — CBC
HCT: 24.7 % — ABNORMAL LOW (ref 36.0–46.0)
Hemoglobin: 7.5 g/dL — ABNORMAL LOW (ref 12.0–15.0)
MCH: 22.1 pg — ABNORMAL LOW (ref 26.0–34.0)
MCHC: 30.4 g/dL (ref 30.0–36.0)
RDW: 19.8 % — ABNORMAL HIGH (ref 11.5–15.5)

## 2010-11-06 LAB — BASIC METABOLIC PANEL
BUN: 8 mg/dL (ref 6–23)
Calcium: 8.8 mg/dL (ref 8.4–10.5)
Creatinine, Ser: 0.79 mg/dL (ref 0.50–1.10)
GFR calc non Af Amer: >90 mL/min (ref >90)
Glucose, Bld: 74 mg/dL (ref 70–99)

## 2010-11-07 NOTE — H&P (Signed)
[name], [name] NO.:  [id number]  MEDICAL RECORD NO.:  [id number]  LOCATION:  [facility]                         FACILITY:  [facility]  PHYSICIAN:  Altha Harm, MDDATE OF BIRTH:  1980-06-25  DATE OF ADMISSION:  10/19/2010 DATE OF DISCHARGE:                             HISTORY & PHYSICAL   CHIEF COMPLAINT:  Abdominal pain and near syncope approximately 12 hours ago.  HISTORY OF PRESENT ILLNESS:  [name] is a 30 year old female who has a history of gastric bypass surgery Roux-en-Y in 2010.  The patient states that during her workday today, she started to have an abdominal pain which was insidious, sharp sudden onset 10/10.  The pain was exacerbated with any movement and she cannot identify any palliative features.  The patient states that at the most intense that she felt syncopal and at that time, her coworkers called EMS and she was brought to the emergency room.  She states that as her pain escalated, she started to have dry heaves with the pain.  She states that the dry heaves only resulted in clear contents and which was very small in amount.  The patient states that since that intense pain with a near syncopal episode this morning, she has had no further near syncopal episodes, but she is still continuing to have intense abdominal pain which only appears to be relieved with IV pain medication.  She denies any chest pain.  She denies any fever or cough prior to coming to the hospital.  Here in the hospital, she has had recorded fevers with the highest temperature recorded here of 103.  PAST MEDICAL HISTORY:  Significant for the following: 1. Hypertension. 2. Iron deficiency anemia requiring monthly iron infusions. 3. Status post gastric bypass Roux-en-Y in December of 2010. 4. Miscarriage approximately 4 months ago at gestational age of [redacted]     weeks' and the patient is status post D and C.  FAMILY HISTORY:  Significant for diabetes in mother and  father and hypertension in her mother and father.  Besides that there is no other significant family history.  SOCIAL HISTORY:  The patient works at [employer] in [place].  She lives alone.  However she states that her boyfriend is regularly at her home.  There is no tobacco or drug use.  She drinks approximately 1 glass of wine maybe once to twice a week.  Her last normal menstrual period is actually now and she states that she started approximately 4 days ago and is currently menstruating.  Her periods usually lasts about 4-5 days.  She is sexually active, unprotected sex.  Her healthcare power of attorney is her mother, julius santodomingo who can be reached at home at [phone].  MEDICATIONS:  She takes labetalol 400 mg p.o. b.i.d., she takes bariatric vitamins 1 tab p.o. q.i.d., and she takes Tylenol as needed for pain.  PRIMARY CARE PHYSICIAN:  Dr. Janeece Riggers for [facility] in [place].  OB/GYN DOCTOR:  Dr. Gerome Apley at [facility].  ALLERGIES:  To PENICILLIN which causes her to have rash.  REVIEW OF SYSTEMS:  Except as noted in the HPI, all other systems are negative.  Studies in  the emergency room showed the following: 1. A hemogram shows a white blood cell count of 9.0, hemoglobin of     11.2, hematocrit of 33, platelet count of 458.  Sodium is 140,     potassium 3.0, chloride 104, bicarb 24, BUN 17, creatinine 0.7.     Urinalysis is positive for 3-6 wbc's.  Her chest x-ray shows     bilateral fluffy perihilar opacity.  A transvaginal ultrasound     shows a 10.3 complex cystic mass superior to the uterus of unknown     etiology.  This could represent a tubo-ovarian abscess or not other     non gynecologic abscess.  Since neither ovary can be visualized,     cannot exclude a cystic ovarian process.  Further evaluation may be     helpful with CT with IV and oral contrast. 2. CT abdomen and pelvis with contrast which shows a complex cystic     mass superior and  anterior to the uterus 9.9 x 7.4 x 8.5 cm in     size.  Suspicion of ovarian origin and highly suspicious for     ovarian neoplasm.  There is significant low-attenuation ascites and     there are few minimally prominent pelvic lymph nodes noted.  PHYSICAL EXAMINATION:  VITAL SIGNS:  Temperature is 103, repeat temperature is 99.3 after Tylenol being given.  Heart rate 102, blood pressure 140/96, respiratory rate 16, O2 sat 100% on 2 L of oxygen. HEENT:  She is normocephalic, atraumatic.  Pupils equally round, round and reactive to light and accommodation.  Extraocular movements are intact.  Oropharynx is moist.  No exudate, erythema, or lesions are noted. NECK:  Trachea is midline.  No masses.  No thyromegaly.  No JVD.  No carotid bruit. RESPIRATORY:  The patient has normal respiratory effort.  Equal excursion bilaterally.  No wheezing or rhonchi noted. CARDIOVASCULAR:  She has got a normal S1 and S2, mildly tachycardic.  No murmurs, rubs, or gallops are noted.  PMI is nondisplaced.  No heaves or thrills on palpation. ABDOMEN:  The patient has obese patulous abdomen.  The patient has diffuse tenderness throughout the abdomen and I cannot find one area of tenderness however it is exquisitely tender.  I cannot appreciate any masses.  I cannot appreciate any hepatosplenomegaly.  The patient does have guarding.  There is a component of voluntary guarding and she appears to have some rebound on examination.  LYMPH NODE SURVEY:  She has got no cervical, axillary, inguinal lymphadenopathy noted. NEUROLOGICAL:  There are no focal neurological deficits.  Cranial nerves II-XII are grossly intact.  DTRs are 2+ bilaterally in the upper and lower extremities and strength is 4+/5 in bilateral upper and lower extremities. MUSCULOSKELETAL:  She has got no warmth, swelling, or erythema around the joints and there is no spinal tenderness noted.  ASSESSMENT AND PLAN:  This is a patient who presents with  one what appears to be a community-acquired pneumonia.  The patient has clinical findings consistent with an infection and we will go ahead and start the patient on Rocephin and azithromycin.  Blood cultures have been taken in the emergency room by confirmation with the nurse.  And we will follow up though the patient does not have a cough which is not productive of sputum which makes me concerned that this may actually just be pulmonary edema.  However given the degree of her illness, we will treat her accordingly.  In light of the  complex cystic uterine mass which is suspicious for ovarian origin.  In the patient's clinical picture, I cannot ignore that this may be a tubo-ovarian abscess and I will treat the patient with Flagyl in addition to the antibiotics to cover for the community-acquired pneumonia.  Dr. Ambrose Mantle, her OB/GYN has been contacted by Dr. Ethelda Chick, and he will see the patient in consult tomorrow. However, given the clinical examination, I will ask the general surgeons to see the patient in consultation in the event of the fact that this may be a tubo-ovarian abscess requiring surgical intervention.  We will go ahead and restart the patient on her labetalol for her hypertension and we will support the patient with IV fluids, oxygen, and pain medications.  Deep vein thrombosis prophylaxis with Lovenox.  We will start the patient off with oral pain medications, if that is not adequate, she can get morphine sulfate by IV for further pain control. Further therapy will depend upon patient's initial response to therapy, input, and recommendations from both OB GYN and Surgery and further testing.  The patient is being admitted to [facility] and she will be admitted to a non-telemetry bed.     Altha Harm, MD     MAM/MEDQ  D:  10/19/2010  T:  10/19/2010  Job:  [id number]  cc:   Dr. Juanetta Beets F. Ambrose Mantle, M.D. Dr.  Ethelda Chick  Electronically Signed by Marthann Schiller MD on 11/07/2010 01:24:33 PM

## 2010-11-08 NOTE — Consult Note (Signed)
Maria Quinn, KRIGBAUM NO.:  [id number]  MEDICAL RECORD NO.:  [id number]  LOCATION:  4743                         FACILITY:  [facility]  PHYSICIAN:  Velora Heckler, MD      DATE OF BIRTH:  26-Nov-1980  DATE OF CONSULTATION: DATE OF DISCHARGE:                                CONSULTATION   REQUESTING PHYSICIAN:  Malachi Pro. Ambrose Mantle, MD  CONSULTING SURGEON:  Velora Heckler, MD  HISTORY OF PRESENT ILLNESS:  Ms. Rygh is a 30 year old African American female who had a history of gastric bypass in 2010.  She has also had a history of a recent miscarriage and D and C about 4 months ago.  She presented with abdominal pain and apparently a near syncopal episode prior to admission.  Her initial evaluation revealed significantly high fever, and initial workup showed evidence of a large complex cystic mass in the pelvis of uncertain etiology.  She has been admitted and worked up and getting IV fluids.  She has subsequently had a percutaneous drain placed into the pelvic fluid collection, which has not yielded much more output since initial placement.  However, she has developed significantly worsened intra-abdominal ascites, which is multiloculated.  A tap was obtained yesterday yielding about 250 mL of fluid that has been sent for cytology, so far negative.  Her white blood cell count has risen to greater than 20,000 on multiple occasions, despite antibiotics while she has been here.  The consulting gynecologist and has requested that surgeon be involved in the event that the patient needs to have an exploratory laparotomy or assisted exploratory laparotomy for pelvic mass.  PAST MEDICAL HISTORY:  Significant for: 1. Hypertension. 2. Iron-deficiency anemia. 3. Morbid obesity.  PAST SURGICAL HISTORY:  Includes gastric Roux-en-Y bypass in December 2010.  She also had a D and C about 4 months ago.  SOCIAL HISTORY:  The patient is employed, but lives at home by herself. She  denies any tobacco or illicit drug use.  Has an occasional glass of wine or drink once or twice a week.  MEDICATIONS:  Routinely takes labetalol postop gastric bypass, multivitamins, and Tylenol as needed for pain.  ALLERGIES:  Include PENICILLIN.  REVIEW OF SYSTEMS:  Please see history of present illness for pertinent findings, otherwise 10-system review negative.  PHYSICAL EXAMINATION:  GENERAL:  An obese African American female who is nontoxic appearing, in no acute distress. VITAL SIGNS:  Currently, temperature of 98.5, heart rate of 96, blood pressure of 129/84, respiratory rate of 20, oxygenation of 94% on room air. ENT:  Unremarkable. LUNGS:  Clear to auscultation.  No wheezes, rhonchi, or rales. HEART:  Regular rate and rhythm.  No murmurs, gallops, or rubs. Carotids 2+ and brisk without bruits.  Peripheral pulses intact and symmetrical. ABDOMEN:  Obese and moderately distended.  She is tender diffusely predominantly in the upper abdomen greatest than the lower abdomen. There is a lower abdomen percutaneous drain that is intact, scant serous fluid is coming out of that. EXTREMITIES:  Good active range of motion in all extremities without crepitus or pain.  Normal muscle strength and tone without atrophy. SKIN:  Otherwise warm and dry  with good turgor.  No rashes, lesions, or nodules. NEUROLOGIC:  She is alert and oriented.  DIAGNOSTICS:  Labs from today revealed a white blood cell count of 18.5, hemoglobin of 8.7, hematocrit of 28.2, platelet count of 425.  Metabolic panel shows a sodium of 135, potassium of 3.6, chloride of 97, CO2 of 29, BUN of 4, creatinine of 0.59, glucose of 77.  LFTs within normal limits.  Culture obtained of the pelvic abscess shows of Peptostreptococcus.  Fluid cytology from the repeat samples shows no evidence of malignant cells.  Interestingly, her INR is elevated at 1.54, CA-125 elevated at 82.4.  IMAGING:  Imaging reviewed, again shows  complex cystic mass in the pelvis with associated multiloculated ascites scattered throughout the abdomen.  No inflammatory changes of the bowel or gastrointestinal tract are noted.  No evidence of diverticular disease is appreciated.  No evidence of free air is noted either.  IMPRESSION: 1. Complicated pelvic mass, possibly tubo-ovarian abscess possible     secondary to malignancy. 2. Multiloculated ascites secondary to number 1 process.  RECOMMENDATIONS:  Dr. Gerrit Friends has discussed thoroughly via phone with Dr. Ambrose Mantle regarding this patient's case and the possible need for operative management.  At this point, we feel there is no general surgical issue that needs immediate surgical attention, but certainly understand the complexity of the process intra-abdominally and we can certainly be available if surgical decision is made.  Dr. Gerrit Friends has seen the patient today, discussed this with her in the event that she needs to go to the operating room.  We do understand that there is also oncology consult pending and await input from them.     Brayton El, PA-C   ______________________________ Velora Heckler, MD    KB/MEDQ  D:  10/28/2010  T:  10/29/2010  Job:  [id number]  cc:   Malachi Pro. Ambrose Mantle, M.D.  Electronically Signed by Brayton El  on 11/02/2010 03:46:42 PM Electronically Signed by Darnell Level MD on 11/08/2010 11:17:12 AM

## 2010-11-09 NOTE — Consult Note (Signed)
Maria Quinn, Maria Quinn NO.:  [id number]  MEDICAL RECORD NO.:  [id number]  LOCATION:  4743                         FACILITY:  [facility]  PHYSICIAN:  Garnetta Buddy, M.D.   DATE OF BIRTH:  27-Sep-1980  DATE OF CONSULTATION:  10/31/2010 DATE OF DISCHARGE:                                CONSULTATION   REASON FOR CONSULTATION:  Acute renal failure.  HISTORY OF PRESENT ILLNESS:  This is a complicated patient who underwent exploratory laparotomy, hysterectomy, and drainage of multilocular abdominal abscess on October 29, 2010.  Please note over the last 24 hours she had increased creatinine and decreased urine output.  Review of the records from the operating room revealed that there was some intraoperative hypotension, which could be responsible for decrease in urine output with blood pressures of 100 mmHg.  No intravenous contrast dye was used at this time.  PAST MEDICAL HISTORY: 1. Hypertension. 2. Iron-deficiency anemia. 3. Morbid obesity. 4. History of Roux-en-Y gastric bypass in December 2010.  MEDICATIONS: 1. Amlodipine 10 mg daily. 2. Doxycycline 100 mg q.12 h. 3. Mefoxin 2 g q.6 h. 4. Hydrochlorothiazide 25 mg a day. 5. Flagyl 500 mg t.i.d.  ALLERGIES:  PENICILLIN.  SOCIAL HISTORY:  No tobacco, occasional alcohol.  She lives alone.  She works for [employer].  FAMILY HISTORY:  No history of renal disease or end-stage renal disease requiring dialysis.  REVIEW OF SYSTEMS:  Review of systems was negative on 11-system review except for findings she has got diminished bowel sounds and diminished appetite.  No vomiting, no bowel movements.  PHYSICAL EXAMINATION:  VITAL SIGNS:  Blood pressure 127/83, pulse 105, temperature 98.3.  Urine output 500 mL.  Positive 175 mL at last shift. HEAD AND EYES:  Normocephalic and atraumatic.  Pupils are round, equal, and reactive.  Mild pallor.  Extraocular movements intact.  No icterus. EARS, NOSE, MOUTH, AND  THROAT:  External appearance is normal.  Nasal mucosa is clear.  Oropharynx is clear. NECK:  Supple.  JVP not elevated.  No thyromegaly.  No lymphadenopathy. CARDIOVASCULAR:  Regular rate and rhythm with no murmurs, rubs, or gallops. RESPIRATORY:  Percussion and resonant throughout.  Lung fields are clear to auscultation anteriorly and laterally. ABDOMEN:  Appropriately tender.  Bowel sounds were diminished throughout.  No abdominal bruits. EXTREMITIES:  No cyanosis, clubbing, or edema.  2+ peripheral pulses.  LABORATORY DATA:  Most recent labs; sodium 135, potassium 4.6, chloride 97, CO2 of 28, BUN 14, creatinine 1.65, glucose of 75, albumin 1.9, calcium 8.7.  WBC 27.1, hemoglobin 7.3, and platelets 555.  ASSESSMENT/PLAN: 1. Acute renal failure most likely acute tubular necrosis.  We will     urine creatinine, urine sodium.  We will consider consent regarding     pelvic surgery and damage to unilateral ureter, will require     urological consultation.  She is due for CAT scan to be performed     today.  We will also send the fluids in the pelvis for creatinine. 2. Hypertension, volume control. 3. Anemia.  Consider transfusion. 4. Acute renal failure.  Decreased Mefoxin dose.  Discontinued     hydrochlorothiazide.     Daphine Deutscher  Glade Lloyd, M.D.     MWW/MEDQ  D:  10/31/2010  T:  10/31/2010  Job:  [id number]  cc:   Malachi Pro. Ambrose Mantle, M.D.  Electronically Signed by Elvis Coil M.D. on 11/09/2010 10:14:59 AM

## 2010-11-10 ENCOUNTER — Ambulatory Visit: Payer: Self-pay | Admitting: Internal Medicine

## 2010-12-09 LAB — AFB CULTURE WITH SMEAR (NOT AT ARMC): Acid Fast Smear: NONE SEEN

## 2010-12-10 ENCOUNTER — Ambulatory Visit: Payer: Self-pay | Admitting: Internal Medicine

## 2011-02-24 ENCOUNTER — Ambulatory Visit: Payer: Self-pay | Admitting: Internal Medicine

## 2011-02-24 LAB — CBC CANCER CENTER
Basophil %: 0.4 %
Eosinophil #: 0 x10 3/mm (ref 0.0–0.7)
HCT: 38 % (ref 35.0–47.0)
HGB: 12.6 g/dL (ref 12.0–16.0)
Monocyte #: 0.7 x10 3/mm (ref 0.0–0.7)
Neutrophil #: 3.1 x10 3/mm (ref 1.4–6.5)
Neutrophil %: 54.6 %
RBC: 5.16 10*6/uL (ref 3.80–5.20)
RDW: 17.2 % — ABNORMAL HIGH (ref 11.5–14.5)
WBC: 5.7 x10 3/mm (ref 3.6–11.0)

## 2011-02-24 LAB — IRON AND TIBC
Iron Bind.Cap.(Total): 316 ug/dL (ref 250–450)
Iron Saturation: 15 %
Iron: 48 ug/dL — ABNORMAL LOW (ref 50–170)

## 2011-02-24 LAB — RETICULOCYTES: Reticulocyte: 0.8 % (ref 0.5–1.5)

## 2011-02-24 LAB — FERRITIN: Ferritin (ARMC): 69 ng/mL (ref 8–388)

## 2011-03-10 ENCOUNTER — Ambulatory Visit: Payer: Self-pay | Admitting: Internal Medicine

## 2011-08-01 ENCOUNTER — Encounter (HOSPITAL_COMMUNITY): Payer: Self-pay | Admitting: Emergency Medicine

## 2011-08-01 DIAGNOSIS — R29810 Facial weakness: Secondary | ICD-10-CM | POA: Insufficient documentation

## 2011-08-01 DIAGNOSIS — R5381 Other malaise: Secondary | ICD-10-CM | POA: Insufficient documentation

## 2011-08-01 DIAGNOSIS — G510 Bell's palsy: Secondary | ICD-10-CM | POA: Insufficient documentation

## 2011-08-01 DIAGNOSIS — I10 Essential (primary) hypertension: Secondary | ICD-10-CM | POA: Insufficient documentation

## 2011-08-01 DIAGNOSIS — R5383 Other fatigue: Secondary | ICD-10-CM | POA: Insufficient documentation

## 2011-08-01 NOTE — ED Notes (Signed)
States she begin to notice R facial drouping about 1900 today hand grasp equal strong

## 2011-08-02 ENCOUNTER — Emergency Department (HOSPITAL_COMMUNITY)
Admission: EM | Admit: 2011-08-02 | Discharge: 2011-08-02 | Disposition: A | Discharge status: Home / Self Care | Payer: 59 | Attending: Emergency Medicine | Admitting: Emergency Medicine

## 2011-08-02 ENCOUNTER — Encounter (HOSPITAL_COMMUNITY): Payer: Self-pay | Admitting: Radiology

## 2011-08-02 ENCOUNTER — Emergency Department (HOSPITAL_COMMUNITY): Payer: 59

## 2011-08-02 DIAGNOSIS — G510 Bell's palsy: Secondary | ICD-10-CM

## 2011-08-02 LAB — BASIC METABOLIC PANEL
CO2: 27 mEq/L (ref 19–32)
Calcium: 9.1 mg/dL (ref 8.4–10.5)
Creatinine, Ser: 0.55 mg/dL (ref 0.50–1.10)
GFR calc non Af Amer: >90 mL/min (ref >90)

## 2011-08-02 LAB — URINALYSIS, ROUTINE W REFLEX MICROSCOPIC
Glucose, UA: NEGATIVE mg/dL
Ketones, ur: NEGATIVE mg/dL
Leukocytes, UA: NEGATIVE
Protein, ur: NEGATIVE mg/dL
Urobilinogen, UA: 0.2 mg/dL (ref 0.0–1.0)

## 2011-08-02 LAB — CBC
MCH: 24.2 pg — ABNORMAL LOW (ref 26.0–34.0)
MCHC: 30.9 g/dL (ref 30.0–36.0)
MCV: 78.2 fL (ref 78.0–100.0)
Platelets: 167 10*3/uL (ref 150–400)
RDW: 15.1 % (ref 11.5–15.5)

## 2011-08-02 MED ORDER — ACYCLOVIR 400 MG PO TABS: 400.0000 mg | ORAL_TABLET | Freq: Four times a day (QID) | ORAL | Status: AC

## 2011-08-02 MED ORDER — LABETALOL HCL 5 MG/ML IV SOLN
20.0000 mg | Freq: Once | INTRAVENOUS | Status: AC
  Administered 2011-08-02: 20 mg via INTRAVENOUS
  Filled 2011-08-02: qty 4

## 2011-08-02 MED ORDER — PREDNISONE 20 MG PO TABS
60.0000 mg | ORAL_TABLET | Freq: Once | ORAL | Status: AC
  Administered 2011-08-02: 60 mg via ORAL
  Filled 2011-08-02: qty 3

## 2011-08-02 MED ORDER — ACYCLOVIR 200 MG PO CAPS
400.0000 mg | ORAL_CAPSULE | Freq: Once | ORAL | Status: AC
  Administered 2011-08-02: 400 mg via ORAL
  Filled 2011-08-02: qty 2

## 2011-08-02 MED ORDER — PREDNISONE 20 MG PO TABS: ORAL_TABLET | ORAL | Status: AC

## 2011-08-02 MED ORDER — SODIUM CHLORIDE 0.9 % IV SOLN
INTRAVENOUS | Status: DC
  Administered 2011-08-02: 02:00:00 via INTRAVENOUS

## 2011-08-02 NOTE — ED Provider Notes (Signed)
History     CSN: [id number]  Arrival date & time 08/01/11  2332   First MD Initiated Contact with Patient 08/02/11 0211      Chief Complaint  Patient presents with  . Weakness    (Consider location/radiation/quality/duration/timing/severity/associated sxs/prior treatment) HPI History provided by patient. Tonight after work developed right facial droop. Has had some recent congestion but no fevers or illness otherwise. Patient is being treated for hypertension and states she is now is take medications as prescribed. No difficulty with speech or vision. No difficulty with gait. No unilateral weakness or numbness otherwise. No history of stroke. No ear pain. No ear drainage. Moderate severity. Past Medical History  Diagnosis Date  . Gastric bypass for obesity complicating pregnancy, birth, puerperium   . Hypertension   . Diabetes mellitus     Past Surgical History  Procedure Date  . Abdominal hysterectomy     History reviewed. No pertinent family history.  History  Substance Use Topics  . Smoking status: Never Smoker   . Smokeless tobacco: Not on file  . Alcohol Use: Yes    OB History    Grav Para Term Preterm Abortions TAB SAB Ect Mult Living                  Review of Systems  Constitutional: Negative for fever and chills.  HENT: Negative for neck pain and neck stiffness.   Eyes: Negative for pain.  Respiratory: Negative for shortness of breath.   Cardiovascular: Negative for chest pain.  Gastrointestinal: Negative for abdominal pain.  Genitourinary: Negative for dysuria.  Musculoskeletal: Negative for back pain.  Skin: Negative for rash.  Neurological: Positive for facial asymmetry. Negative for headaches.  All other systems reviewed and are negative.    Allergies  Penicillins  Home Medications   Current Outpatient Rx  Name Route Sig Dispense Refill  . ESTRADIOL 0.5 MG PO TABS Oral Take 1 mg by mouth daily.    . IBUPROFEN 200 MG PO TABS Oral Take 200  mg by mouth every 6 (six) hours as needed. For pain    . METOPROLOL TARTRATE 50 MG PO TABS Oral Take 50 mg by mouth daily.    . NEBIVOLOL HCL 10 MG PO TABS Oral Take 10 mg by mouth daily.      BP 159/89  Pulse 72  Temp 98.4 F (36.9 C) (Oral)  Resp 14  SpO2 100%  LMP 10/19/2010  Physical Exam  Constitutional: She is oriented to person, place, and time. She appears well-developed and well-nourished.  HENT:  Head: Normocephalic and atraumatic.  Right Ear: External ear normal.  Left Ear: External ear normal.  Eyes: EOM are normal. Pupils are equal, round, and reactive to light.  Neck: Trachea normal. Neck supple. No thyromegaly present.  Cardiovascular: Normal rate, regular rhythm, S1 normal, S2 normal and normal pulses.     No systolic murmur is present   No diastolic murmur is present  Pulses:      Radial pulses are 2+ on the right side, and 2+ on the left side.  Pulmonary/Chest: Effort normal and breath sounds normal. She has no wheezes. She has no rhonchi. She has no rales. She exhibits no tenderness.  Abdominal: Soft. Normal appearance and bowel sounds are normal. There is no tenderness. There is no CVA tenderness and negative Murphy's sign.  Musculoskeletal:       BLE:s Calves nontender, no cords or erythema, negative Homans sign  Neurological: She is alert and oriented to  person, place, and time. She has normal strength. No sensory deficit. GCS eye subscore is 4. GCS verbal subscore is 5. GCS motor subscore is 6.       Right facial droop involving for head/  7th cranial nerve deficit. No other deficits on exam. Speech clear. Grips equal. Biceps and triceps equal. Dorsi plantar flexion and hip flexion equal and intact. Sensorium to light touch intact throughout. DTRs equal. Negative Romberg's.  Skin: Skin is warm and dry. No rash noted. She is not diaphoretic.  Psychiatric: Her speech is normal.       Cooperative and appropriate    ED Course  Procedures (including critical  care time)  Results for orders placed during the hospital encounter of 08/02/11  CBC      Component Value Range   WBC 5.9  4.0 - 10.5 K/uL   RBC 4.72  3.87 - 5.11 MIL/uL   Hemoglobin 11.4 (*) 12.0 - 15.0 g/dL   HCT 04.5  40.9 - 81.1 %   MCV 78.2  78.0 - 100.0 fL   MCH 24.2 (*) 26.0 - 34.0 pg   MCHC 30.9  30.0 - 36.0 g/dL   RDW 91.4  78.2 - 95.6 %   Platelets 167  150 - 400 K/uL  BASIC METABOLIC PANEL      Component Value Range   Sodium 141  135 - 145 mEq/L   Potassium 3.6  3.5 - 5.1 mEq/L   Chloride 105  96 - 112 mEq/L   CO2 27  19 - 32 mEq/L   Glucose, Bld 120 (*) 70 - 99 mg/dL   BUN 19  6 - 23 mg/dL   Creatinine, Ser 2.13  0.50 - 1.10 mg/dL   Calcium 9.1  8.4 - 08.6 mg/dL   GFR calc non Af Amer >90  >90 mL/min   GFR calc Af Amer >90  >90 mL/min  URINALYSIS, ROUTINE W REFLEX MICROSCOPIC      Component Value Range   Color, Urine YELLOW  YELLOW   APPearance CLEAR  CLEAR   Specific Gravity, Urine 1.023  1.005 - 1.030   pH 5.5  5.0 - 8.0   Glucose, UA NEGATIVE  NEGATIVE mg/dL   Hgb urine dipstick NEGATIVE  NEGATIVE   Bilirubin Urine NEGATIVE  NEGATIVE   Ketones, ur NEGATIVE  NEGATIVE mg/dL   Protein, ur NEGATIVE  NEGATIVE mg/dL   Urobilinogen, UA 0.2  0.0 - 1.0 mg/dL   Nitrite NEGATIVE  NEGATIVE   Leukocytes, UA NEGATIVE  NEGATIVE  POCT PREGNANCY, URINE      Component Value Range   Preg Test, Ur NEGATIVE  NEGATIVE    Date: 08/02/2011  Rate: 63  Rhythm: normal sinus rhythm  QRS Axis: normal  Intervals: normal  ST/T Wave abnormalities: nonspecific ST/T changes  Conduction Disutrbances:none  Narrative Interpretation: [facility]  Old EKG Reviewed: unchanged   Labetalol for blood pressure. Prednisone and acyclovir initiated.  Recheck blood pressure much improved. Plan discharge home with outpatient followup. prescription for prednisone and acyclovir provided. Patient agrees to take blood pressure medications as prescribed and followup with her primary care physician.    Presentation consistent with Bell's palsy   MDM   Imaging, EKG, labs, medications. Nursing notes reviewed. Vital signs reviewed. Blood pressure improving. No indication for admission at this time.        Sunnie Nielsen, MD 08/02/11 [phone]

## 2011-08-07 ENCOUNTER — Ambulatory Visit: Payer: 59 | Attending: Otolaryngology | Admitting: Speech Pathology

## 2011-08-07 DIAGNOSIS — IMO0001 Reserved for inherently not codable concepts without codable children: Secondary | ICD-10-CM | POA: Insufficient documentation

## 2011-08-07 DIAGNOSIS — R490 Dysphonia: Secondary | ICD-10-CM | POA: Insufficient documentation

## 2011-08-08 ENCOUNTER — Ambulatory Visit: Payer: 59

## 2011-08-18 ENCOUNTER — Ambulatory Visit: Payer: 59 | Attending: Otolaryngology

## 2011-08-18 DIAGNOSIS — R490 Dysphonia: Secondary | ICD-10-CM | POA: Insufficient documentation

## 2011-08-18 DIAGNOSIS — IMO0001 Reserved for inherently not codable concepts without codable children: Secondary | ICD-10-CM | POA: Insufficient documentation

## 2011-08-23 ENCOUNTER — Ambulatory Visit: Payer: 59 | Admitting: Speech Pathology

## 2011-08-28 ENCOUNTER — Ambulatory Visit: Payer: 59 | Admitting: Speech Pathology

## 2011-08-30 ENCOUNTER — Encounter: Payer: 59 | Admitting: Speech Pathology

## 2011-09-04 ENCOUNTER — Encounter: Payer: 59 | Admitting: Speech Pathology

## 2011-09-06 ENCOUNTER — Ambulatory Visit: Payer: 59 | Admitting: Speech Pathology

## 2013-02-09 IMAGING — CR DG CHEST 1V PORT
1 series · 1 of 1 positions shown · non-contrast
Comparison: Chest x-ray 03/29/2010.

CLINICAL DATA: Shortness of breath with fever.

PORTABLE CHEST - 1 VIEW

[AP]
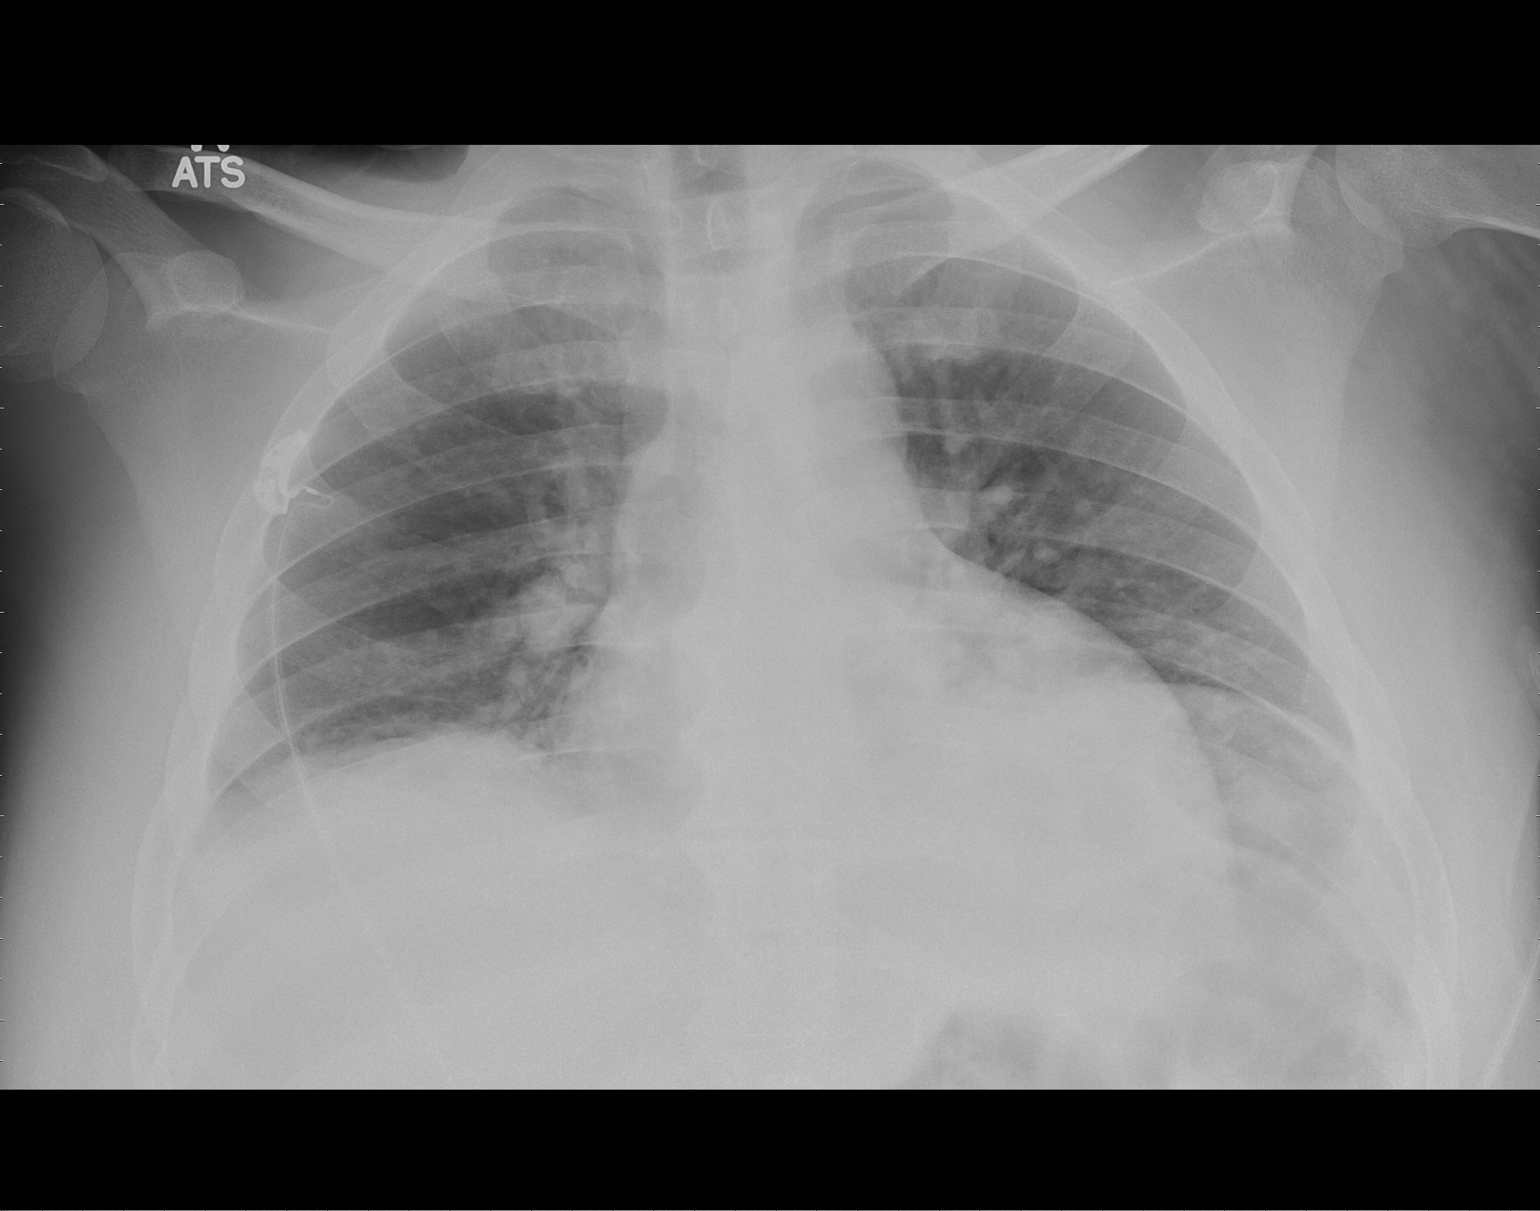

[1 of 1 positions shown; findings below may reference images not displayed]

FINDINGS: Cardiomegaly.  Bilateral fluffy perihilar opacities could
represent failure, metastases, or pneumonia.  Right pleural
effusion.  No pneumothorax.  Bones unremarkable.
IMPRESSION: Bilateral fluffy perihilar opacities as described.  Worsening
aeration compared with prior chest March 2010.

## 2013-02-09 IMAGING — CT CT ABD-PELV W/ CM
2 of 4 series · 17 of 46 positions shown, 19 images · IV contrast (omnipaque)
Comparison: 03/29/2010
Correlation:  Pelvic sonography 10/19/2010

CLINICAL DATA: Abdominal pain, lower abdominal pain, vaginal pain,
nausea, vomiting, hypotension, mass on ultrasound, past history
hypertension gastric bypass

CT ABDOMEN AND PELVIS WITH CONTRAST
TECHNIQUE: Multidetector CT imaging of the abdomen and pelvis was
performed following the standard protocol during bolus
administration of intravenous contrast. Sagittal and coronal MPR
images reconstructed from axial data set.
Contrast: 100mL OMNIPAQUE IOHEXOL 300 MG/ML IV SOLN; Dilute oral
contrast.

[Series 2: abd/pelv with 5.0 b31f st · axial · 0.86mm/px · z∈[+798,+1283]mm · 14 of 107 slices shown, 16 images]
[im 5/107  soft-tissue]
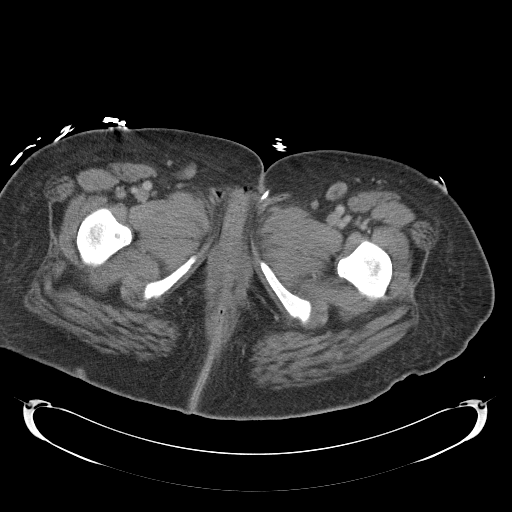
[im 5/107  bone]
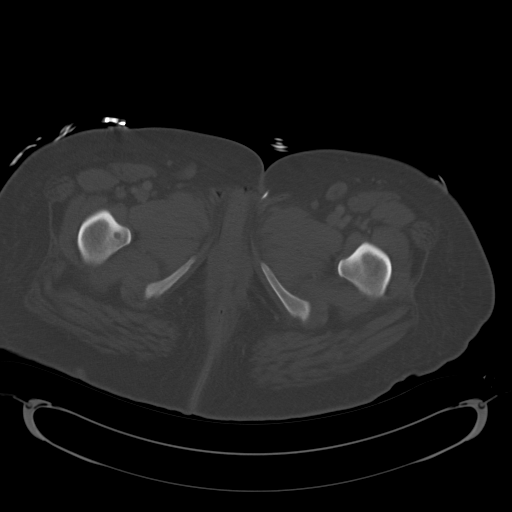
[im 13/107  soft-tissue]
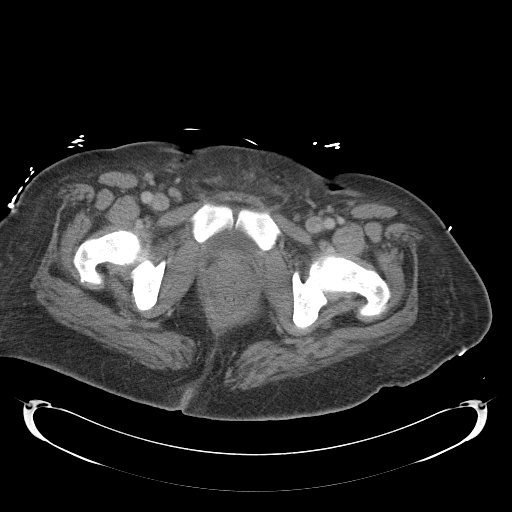
[im 21/107  soft-tissue]
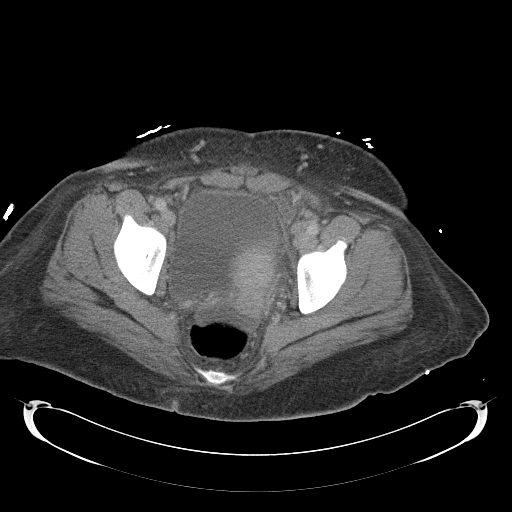
[im 29/107  soft-tissue]
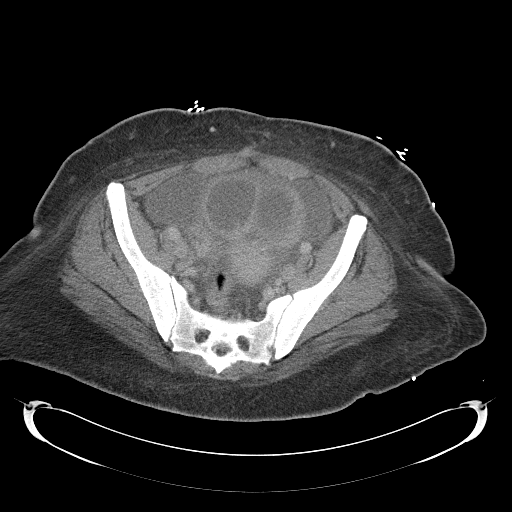
[im 37/107  soft-tissue]
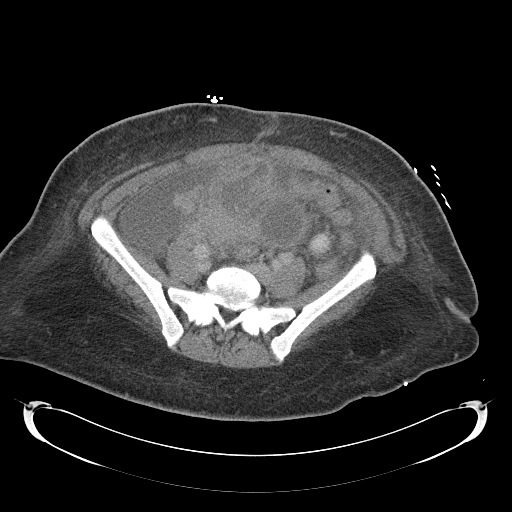
[im 41/107  soft-tissue]
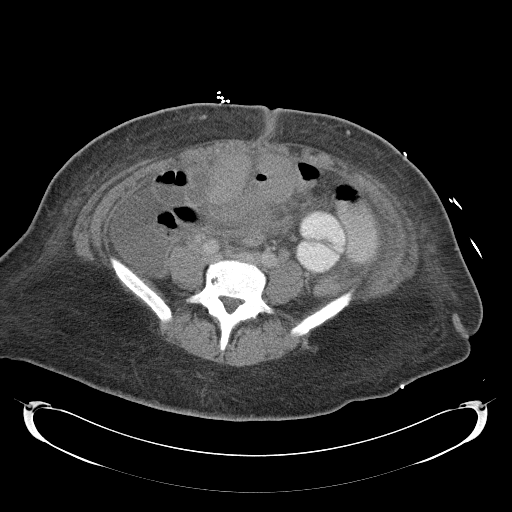
[im 49/107  soft-tissue]
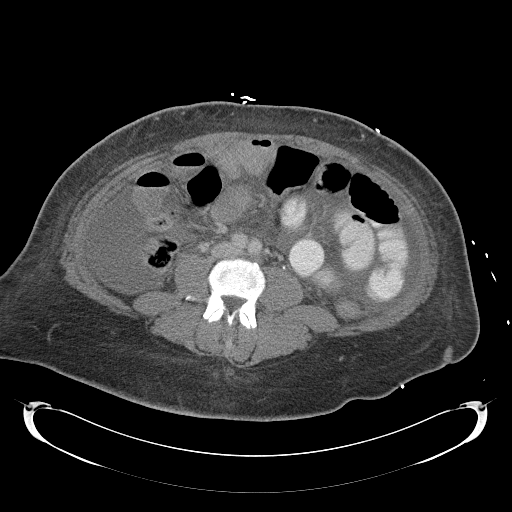
[im 58/107  soft-tissue]
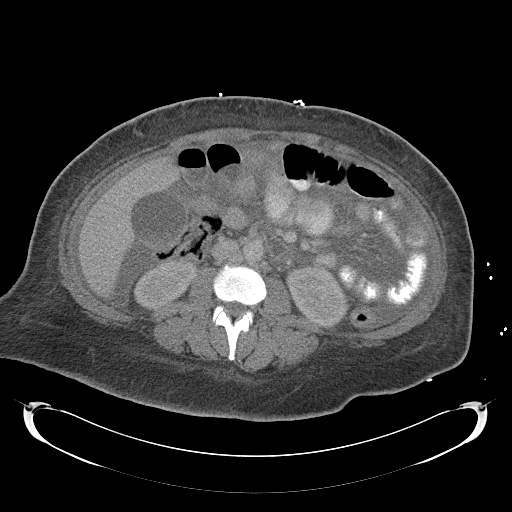
[im 66/107  soft-tissue]
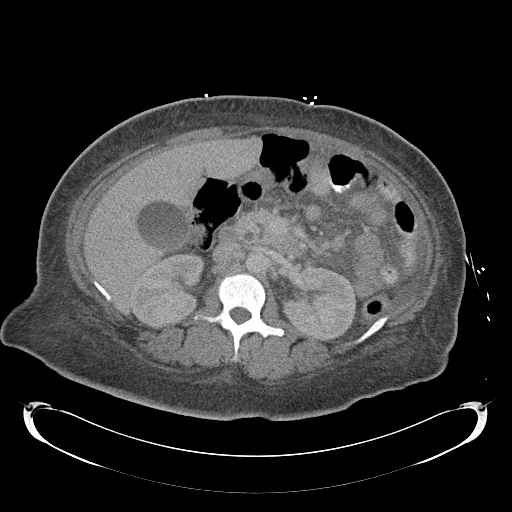
[im 66/107  bone]
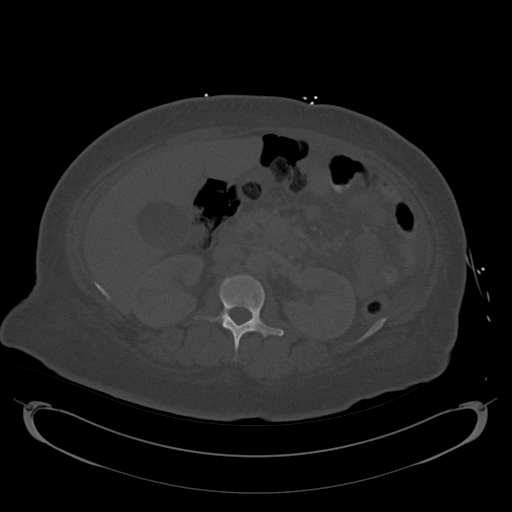
[im 70/107  soft-tissue]
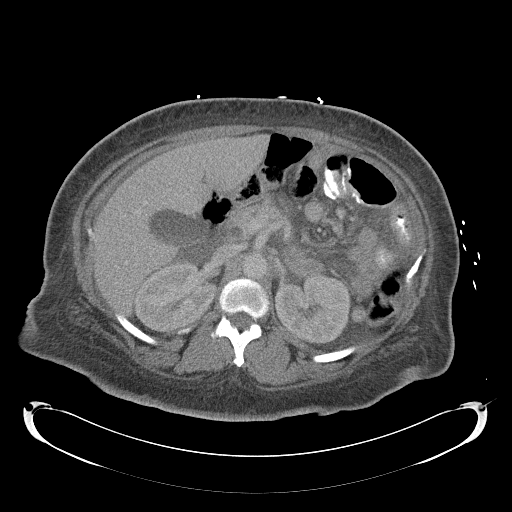
[im 78/107  soft-tissue]
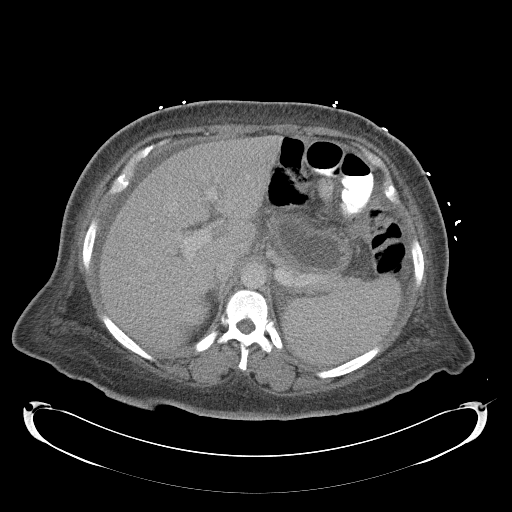
[im 86/107  soft-tissue]
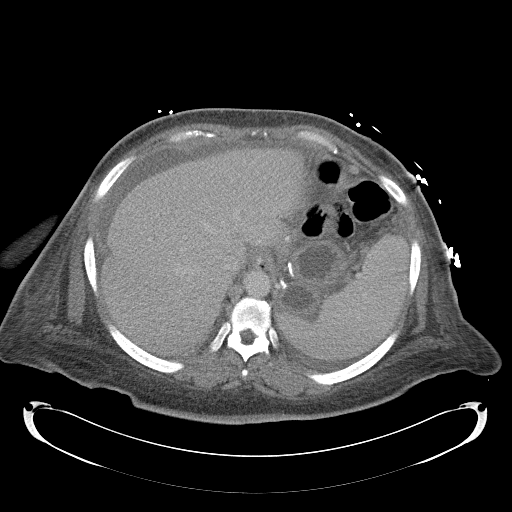
[im 94/107  soft-tissue]
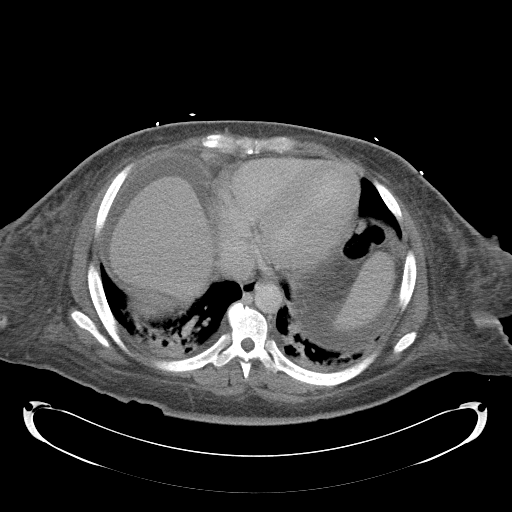
[im 102/107  soft-tissue]
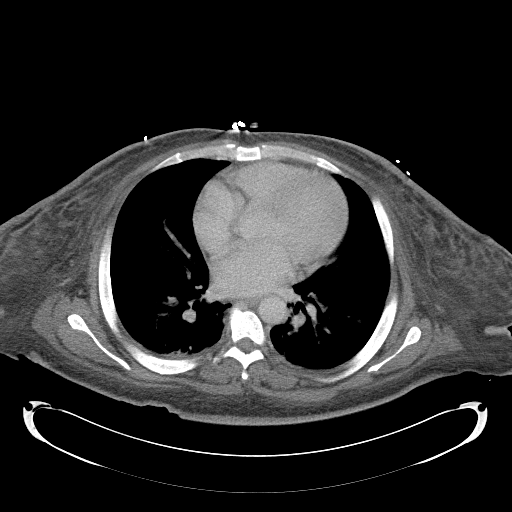

[Series 5: abd/pelv with 3.0 spo cor st · coronal · 1.04mm/px · 3 of 84 slices shown]
[im 28/84  soft-tissue]
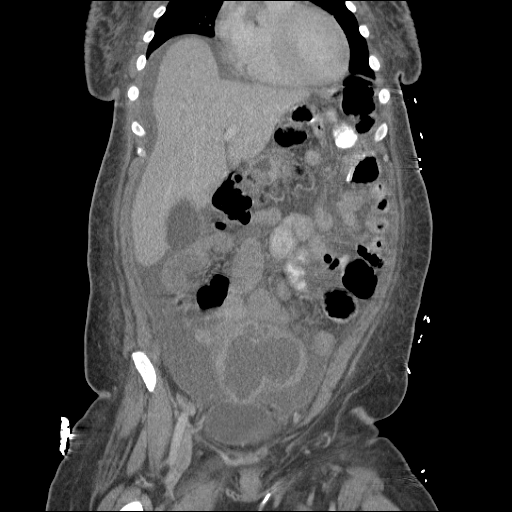
[im 37/84  soft-tissue]
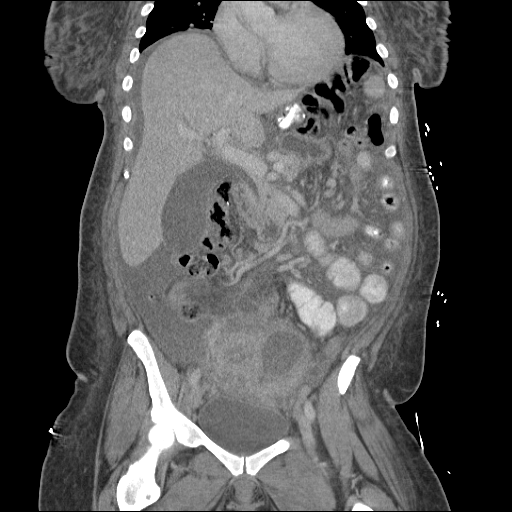
[im 47/84  soft-tissue]
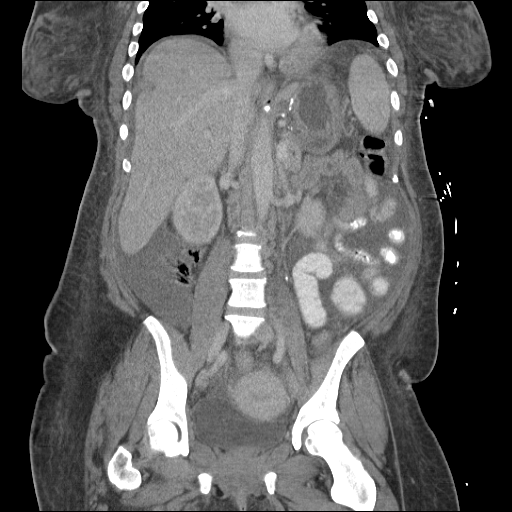

[17 of 46 positions shown; findings below may reference images not displayed]

FINDINGS: Bibasilar atelectasis.
Tiny right pleural effusion.
Liver, spleen, kidneys, and adrenal glands normal appearance.
Minimally prominent pancreatic duct without focal pancreatic mass
or calcification.
Distended gallbladder without calcification.
Unremarkable bladder and uterus.

Complex cystic/solid mass superior anterior to uterus, 9.9 x 7.4 x
8.5 cm in size, suspected of ovarian origin, with neither ovary
definitely visualized.
Finding is highly worrisome for ovarian neoplasm.
Upper normal-sized left external iliac lymph node 11 x 9 mm image
67.
Minimally enlarged right external iliac node 2.2 x 1.1 cm image 80.
Moderate to large volume low attenuation intraperitoneal free
fluid.
No definite additional peritoneal based soft tissue masses
identified.

Few proximal small bowel loops are slightly prominent in size,
nonspecific, with remaining bowel loops and stomach unremarkable.
No additional mass, adenopathy or hernia.
Normal appendix.
No acute osseous findings.
IMPRESSION: Complex cystic mass superior and anterior to the uterus, 9.9 x
x 8.5 cm in size, suspect of ovarian origin, highly suspicious for
ovarian neoplasm.
Significant low attenuation ascites.
Few minimally prominent pelvic lymph nodes as above.

## 2014-06-11 ENCOUNTER — Ambulatory Visit: Payer: Self-pay | Admitting: Family Medicine

## 2015-07-28 DIAGNOSIS — M170 Bilateral primary osteoarthritis of knee: Secondary | ICD-10-CM | POA: Insufficient documentation

## 2015-07-28 DIAGNOSIS — E119 Type 2 diabetes mellitus without complications: Secondary | ICD-10-CM | POA: Insufficient documentation

## 2015-07-28 DIAGNOSIS — I10 Essential (primary) hypertension: Secondary | ICD-10-CM | POA: Insufficient documentation

## 2016-12-20 ENCOUNTER — Emergency Department
Admission: EM | Admit: 2016-12-20 | Discharge: 2016-12-20 | Disposition: A | Discharge status: Home / Self Care | Payer: Self-pay | Attending: Emergency Medicine | Admitting: Emergency Medicine

## 2016-12-20 ENCOUNTER — Encounter: Payer: Self-pay | Admitting: Emergency Medicine

## 2016-12-20 DIAGNOSIS — E119 Type 2 diabetes mellitus without complications: Secondary | ICD-10-CM | POA: Insufficient documentation

## 2016-12-20 DIAGNOSIS — Z79899 Other long term (current) drug therapy: Secondary | ICD-10-CM | POA: Insufficient documentation

## 2016-12-20 DIAGNOSIS — R51 Headache: Secondary | ICD-10-CM | POA: Insufficient documentation

## 2016-12-20 DIAGNOSIS — R519 Headache, unspecified: Secondary | ICD-10-CM

## 2016-12-20 DIAGNOSIS — I10 Essential (primary) hypertension: Secondary | ICD-10-CM | POA: Insufficient documentation

## 2016-12-20 LAB — CBC WITH DIFFERENTIAL/PLATELET
Basophils Absolute: 0 10*3/uL (ref 0–0.1)
Basophils Relative: 0 %
Eosinophils Absolute: 0 10*3/uL (ref 0–0.7)
Eosinophils Relative: 0 %
HCT: 36.7 % (ref 35.0–47.0)
Hemoglobin: 11.5 g/dL — ABNORMAL LOW (ref 12.0–16.0)
LYMPHS ABS: 1.9 10*3/uL (ref 1.0–3.6)
LYMPHS PCT: 24 %
MCH: 21.3 pg — AB (ref 26.0–34.0)
MCHC: 31.5 g/dL — ABNORMAL LOW (ref 32.0–36.0)
MCV: 67.5 fL — AB (ref 80.0–100.0)
Monocytes Absolute: 1.1 10*3/uL — ABNORMAL HIGH (ref 0.2–0.9)
Monocytes Relative: 14 %
NEUTROS ABS: 4.9 10*3/uL (ref 1.4–6.5)
NEUTROS PCT: 62 %
PLATELETS: 248 10*3/uL (ref 150–440)
RBC: 5.43 MIL/uL — ABNORMAL HIGH (ref 3.80–5.20)
RDW: 19.5 % — AB (ref 11.5–14.5)
WBC: 8 10*3/uL (ref 3.6–11.0)

## 2016-12-20 LAB — COMPREHENSIVE METABOLIC PANEL
ALT: 30 U/L (ref 14–54)
ANION GAP: 8 (ref 5–15)
AST: 26 U/L (ref 15–41)
Albumin: 4 g/dL (ref 3.5–5.0)
Alkaline Phosphatase: 91 U/L (ref 38–126)
BUN: 15 mg/dL (ref 6–20)
CO2: 27 mmol/L (ref 22–32)
Calcium: 8.3 mg/dL — ABNORMAL LOW (ref 8.9–10.3)
Chloride: 102 mmol/L (ref 101–111)
Creatinine, Ser: 0.64 mg/dL (ref 0.44–1.00)
Glucose, Bld: 86 mg/dL (ref 65–99)
POTASSIUM: 3.2 mmol/L — AB (ref 3.5–5.1)
Sodium: 137 mmol/L (ref 135–145)
Total Bilirubin: 0.6 mg/dL (ref 0.3–1.2)
Total Protein: 8.5 g/dL — ABNORMAL HIGH (ref 6.5–8.1)

## 2016-12-20 MED ORDER — HYDROCHLOROTHIAZIDE 12.5 MG PO CAPS
12.5000 mg | ORAL_CAPSULE | ORAL | Status: AC
  Administered 2016-12-20: 12.5 mg via ORAL
  Filled 2016-12-20: qty 1

## 2016-12-20 MED ORDER — LOSARTAN POTASSIUM 50 MG PO TABS
50.0000 mg | ORAL_TABLET | ORAL | Status: AC
  Administered 2016-12-20: 50 mg via ORAL
  Filled 2016-12-20: qty 1

## 2016-12-20 MED ORDER — METOCLOPRAMIDE HCL 5 MG/ML IJ SOLN
10.0000 mg | INTRAMUSCULAR | Status: AC
  Administered 2016-12-20: 10 mg via INTRAVENOUS
  Filled 2016-12-20: qty 2

## 2016-12-20 MED ORDER — LOSARTAN POTASSIUM-HCTZ 50-12.5 MG PO TABS: 1.0000 | ORAL_TABLET | Freq: Every day | ORAL | 11 refills | Status: DC

## 2016-12-20 MED ORDER — DIPHENHYDRAMINE HCL 50 MG/ML IJ SOLN
12.5000 mg | INTRAMUSCULAR | Status: AC
  Administered 2016-12-20: 12.5 mg via INTRAVENOUS
  Filled 2016-12-20: qty 1

## 2016-12-20 NOTE — Discharge Instructions (Signed)
As we discussed, you may have been having a migraine, but we believe your symptoms are probably related to your uncontrolled blood pressure.  Since you have not been on blood pressure medicine for about a year, I have prescribed you a medication that is actually 2 medications in 1 and is [facility].  Please take the medication as recommended on the label instructions.  It is also very important that you establish a primary care doctor with whom you can follow-up.    Return to the emergency department if you develop new or worsening symptoms that concern you.

## 2016-12-20 NOTE — ED Notes (Signed)
Pt alert and oriented X4, active, cooperative, pt in NAD. RR even and unlabored, color WNL.  Pt informed to return if any life threatening symptoms occur.  Discharge and followup instructions reviewed.  

## 2016-12-20 NOTE — ED Triage Notes (Addendum)
Pt to ED with c/o of headache that has been ongoing for approx 6 days. Pt states it is a throbbing pain on the left side of her head. Pt denies N/V. Pt denies chest pain. No hx of migraine. Pt states she was prescribed BP medication but has not taken it in approx 1 year.

## 2016-12-20 NOTE — ED Notes (Signed)
Pt c/o HA for the past week and has been taking OTC meds with no relief.. Pt states she stopped taking her b/p meds for a year.pt is hypertensive today.

## 2016-12-20 NOTE — ED Provider Notes (Signed)
[facility] Emergency Department Provider Note  ____________________________________________   First MD Initiated Contact with Patient 12/20/16 1500     (approximate)  I have reviewed the triage vital signs and the nursing notes.   HISTORY  Chief Complaint Headache    HPI [name] is a 36 y.o. female with a history of morbid obesity status post  "Weight loss surgery", hypertension on medications since she was 15, and diabetes which resolved after her gastric bypass who presents for evaluation of severe throbbing headache for about 6 days.  She states that she has not been taking any blood pressure medicine for about a year after being on it since she was 36 years old, she simply decided to stop taking it.  She states that she had headaches in the past with her hypertension but never anything like this.  Nothing in particular makes it feel better or worse including light or activity.  She has had no nausea nor vomiting, no chest pain or shortness of breath, no abdominal pain.  She has had no weakness or numbness in her extremities or difficulty with ambulation or speaking.  She has had no trauma recently.  Her blood pressure was initially 216/126 in triage.  She denies any urinary changes recently.  Past Medical History:  Diagnosis Date  . Diabetes mellitus   . Gastric bypass for obesity complicating pregnancy, birth, puerperium   . Hypertension     There are no active problems to display for this patient.   Past Surgical History:  Procedure Laterality Date  . ABDOMINAL HYSTERECTOMY      Prior to Admission medications   Medication Sig Start Date End Date Taking? Authorizing Provider  estradiol (ESTRACE) 0.5 MG tablet Take 1 mg by mouth daily.    [provider]  ibuprofen (ADVIL,MOTRIN) 200 MG tablet Take 200 mg by mouth every 6 (six) hours as needed. For pain    [provider]  losartan-hydrochlorothiazide (HYZAAR) 50-12.5 MG  tablet Take 1 tablet by mouth daily. 12/20/16 12/20/17  [name], Eesha Schmaltz, MD  metoprolol (LOPRESSOR) 50 MG tablet Take 50 mg by mouth daily.    [provider]  nebivolol (BYSTOLIC) 10 MG tablet Take 10 mg by mouth daily.    [provider]    Allergies Penicillins  History reviewed. No pertinent family history.  Social History Social History   Tobacco Use  . Smoking status: Never Smoker  . Smokeless tobacco: Never Used  Substance Use Topics  . Alcohol use: Yes  . Drug use: No    Review of Systems Constitutional: No fever/chills Eyes: No visual changes. ENT: No sore throat. Cardiovascular: Denies chest pain. Respiratory: Denies shortness of breath. Gastrointestinal: No abdominal pain.  No nausea, no vomiting.  No diarrhea.  No constipation. Genitourinary: Negative for dysuria. Musculoskeletal: Negative for neck pain.  Negative for back pain. Integumentary: Negative for rash. Neurological: Severe throbbing headache x 6 days.  No focal numbness nor weakness.   ____________________________________________   PHYSICAL EXAM:  VITAL SIGNS: ED Triage Vitals  Enc Vitals Group     BP 12/20/16 1251 (!) 216/126     Pulse Rate 12/20/16 1251 71     Resp 12/20/16 1251 14     Temp 12/20/16 1251 98.2 F (36.8 C)     Temp Source 12/20/16 1251 Oral     SpO2 12/20/16 1251 98 %     Weight 12/20/16 1252 (!) 142.9 kg (315 lb)     Height 12/20/16  1252 1.651 m (5\' 5" )     Head Circumference --      Peak Flow --      Pain Score 12/20/16 1316 5     Pain Loc --      Pain Edu? --      Excl. in GC? --     Constitutional: Alert and oriented. Well appearing and in no acute distress. Eyes: Conjunctivae are normal.  Head: Atraumatic. Nose: No congestion/rhinnorhea. Mouth/Throat: Mucous membranes are moist. Neck: No stridor.  No meningeal signs.   Cardiovascular: Normal rate, regular rhythm. Good peripheral circulation. Grossly normal heart sounds. Respiratory: Normal  respiratory effort.  No retractions. Lungs CTAB. Gastrointestinal: Obese. Soft and nontender. No distention.  Musculoskeletal: No lower extremity tenderness nor edema. No gross deformities of extremities. Neurologic:  Normal speech and language. No gross focal neurologic deficits are appreciated.  Skin:  Skin is warm, dry and intact. No rash noted. Psychiatric: Mood and affect are normal. Speech and behavior are normal.  ____________________________________________   LABS (all labs ordered are listed, but only abnormal results are displayed)  Labs Reviewed  COMPREHENSIVE METABOLIC PANEL - Abnormal; Notable for the following components:      Result Value   Potassium 3.2 (*)    Calcium 8.3 (*)    Total Protein 8.5 (*)    All other components within normal limits  CBC WITH DIFFERENTIAL/PLATELET - Abnormal; Notable for the following components:   RBC 5.43 (*)    Hemoglobin 11.5 (*)    MCV 67.5 (*)    MCH 21.3 (*)    MCHC 31.5 (*)    RDW 19.5 (*)    Monocytes Absolute 1.1 (*)    All other components within normal limits   ____________________________________________  EKG  None - EKG not ordered by ED physician ____________________________________________  RADIOLOGY   No results found.  ____________________________________________   PROCEDURES  Critical Care performed: No   Procedure(s) performed:   Procedures   ____________________________________________   INITIAL IMPRESSION / ASSESSMENT AND PLAN / ED COURSE  As part of my medical decision making, I reviewed the following data within the electronic MEDICAL RECORD NUMBER Nursing notes reviewed and incorporated and Labs reviewed     Differential diagnosis includes, but is not limited to, intracranial hemorrhage, meningitis/encephalitis, previous head trauma, cavernous venous thrombosis, tension headache, temporal arteritis, migraine or migraine equivalent, idiopathic intracranial hypertension, and non-specific  headache.  In her particular case, I believe that the patient's headache is caused by her hypertension and/or a migraine, most likely they are related.  Given her long-standing hypertension and the fact she has been nonadherent to her medication regimen for at least a year, I think it is reasonable to begin treatment with oral medications for her hypertension but I do not want to rapidly drop her blood pressure for fear of inducing a CVA.  I had a long talk with her and her mother about her symptoms and her ongoing medical issues in the we agreed on the plan to treat empirically for migraine but also to start on blood pressure medicine.  She has no insurance so I looked on the [employer] $4 list and feel that losartan/HCTZ 50/12.5 would be a good medicine to start.  I gave her a first dose here as well as empiric migraine treatment and I will reassess.  Clinical Course as of Dec 21 1907  Wed Dec 20, 2016  1659 Patient states her headache is completely gone.  Blood pressure is come down from  an original pressure of 216/126 to 170/100.  While still elevated, is improved from prior, and she has likely been hypertensive for a long time.  I will continue my plan with outpatient medication and follow-up.  [CF]    Clinical Course User Index [CF] [name], Alazay Leicht, MD    ____________________________________________  FINAL CLINICAL IMPRESSION(S) / ED DIAGNOSES  Final diagnoses:  Essential hypertension  Bad headache     MEDICATIONS GIVEN DURING THIS VISIT:  Medications  metoCLOPramide (REGLAN) injection 10 mg (10 mg Intravenous Given 12/20/16 1538)  diphenhydrAMINE (BENADRYL) injection 12.5 mg (12.5 mg Intravenous Given 12/20/16 1538)  losartan (COZAAR) tablet 50 mg (50 mg Oral Given 12/20/16 1538)  hydrochlorothiazide (MICROZIDE) capsule 12.5 mg (12.5 mg Oral Given 12/20/16 1538)     ED Discharge Orders        Ordered    losartan-hydrochlorothiazide (HYZAAR) 50-12.5 MG tablet  Daily    Comments:   Generic OK   12/20/16 1516       Note:  This document was prepared using Dragon voice recognition software and may include unintentional dictation errors.    [name], Zeddie Njie, MD 12/20/16 [phone]

## 2017-04-23 ENCOUNTER — Encounter: Payer: Self-pay | Admitting: Internal Medicine

## 2017-04-23 ENCOUNTER — Ambulatory Visit (INDEPENDENT_AMBULATORY_CARE_PROVIDER_SITE_OTHER): Payer: Self-pay | Admitting: Internal Medicine

## 2017-04-23 VITALS — BP 190/30 | HR 79 | Temp 98.3°F | Ht 65.0 in | Wt 299.6 lb

## 2017-04-23 DIAGNOSIS — M25569 Pain in unspecified knee: Secondary | ICD-10-CM

## 2017-04-23 DIAGNOSIS — E559 Vitamin D deficiency, unspecified: Secondary | ICD-10-CM

## 2017-04-23 DIAGNOSIS — Z1329 Encounter for screening for other suspected endocrine disorder: Secondary | ICD-10-CM

## 2017-04-23 DIAGNOSIS — R739 Hyperglycemia, unspecified: Secondary | ICD-10-CM

## 2017-04-23 DIAGNOSIS — Z13818 Encounter for screening for other digestive system disorders: Secondary | ICD-10-CM

## 2017-04-23 DIAGNOSIS — M25561 Pain in right knee: Secondary | ICD-10-CM

## 2017-04-23 DIAGNOSIS — E538 Deficiency of other specified B group vitamins: Secondary | ICD-10-CM

## 2017-04-23 DIAGNOSIS — I10 Essential (primary) hypertension: Secondary | ICD-10-CM

## 2017-04-23 DIAGNOSIS — G4733 Obstructive sleep apnea (adult) (pediatric): Secondary | ICD-10-CM

## 2017-04-23 DIAGNOSIS — Z0184 Encounter for antibody response examination: Secondary | ICD-10-CM

## 2017-04-23 DIAGNOSIS — K760 Fatty (change of) liver, not elsewhere classified: Secondary | ICD-10-CM | POA: Insufficient documentation

## 2017-04-23 DIAGNOSIS — M25562 Pain in left knee: Secondary | ICD-10-CM

## 2017-04-23 DIAGNOSIS — D649 Anemia, unspecified: Secondary | ICD-10-CM

## 2017-04-23 DIAGNOSIS — E1159 Type 2 diabetes mellitus with other circulatory complications: Secondary | ICD-10-CM | POA: Insufficient documentation

## 2017-04-23 DIAGNOSIS — Z1159 Encounter for screening for other viral diseases: Secondary | ICD-10-CM

## 2017-04-23 DIAGNOSIS — I160 Hypertensive urgency: Secondary | ICD-10-CM | POA: Insufficient documentation

## 2017-04-23 DIAGNOSIS — G8929 Other chronic pain: Secondary | ICD-10-CM

## 2017-04-23 MED ORDER — CHLORTHALIDONE 25 MG PO TABS: 12.5000 mg | ORAL_TABLET | Freq: Every day | ORAL | 0 refills | Status: DC

## 2017-04-23 MED ORDER — TELMISARTAN 20 MG PO TABS: 20.0000 mg | ORAL_TABLET | Freq: Every day | ORAL | 0 refills | Status: DC

## 2017-04-23 NOTE — Patient Instructions (Signed)
Fasting next visit for labs  Please take Telmisartan 20 mg daily in the am  Please take chlorthalidone 12.5 mg daily in the am (1/2 pill)  F/u in 1 week   Hypertension Hypertension, commonly called high blood pressure, is when the force of blood pumping through the arteries is too strong. The arteries are the blood vessels that carry blood from the heart throughout the body. Hypertension forces the heart to work harder to pump blood and may cause arteries to become narrow or stiff. Having untreated or uncontrolled hypertension can cause heart attacks, strokes, kidney disease, and other problems. A blood pressure reading consists of a higher number over a lower number. Ideally, your blood pressure should be below 120/80. The first ("top") number is called the systolic pressure. It is a measure of the pressure in your arteries as your heart beats. The second ("bottom") number is called the diastolic pressure. It is a measure of the pressure in your arteries as the heart relaxes. What are the causes? The cause of this condition is not known. What increases the risk? Some risk factors for high blood pressure are under your control. Others are not. Factors you can change  Smoking.  Having type 2 diabetes mellitus, high cholesterol, or both.  Not getting enough exercise or physical activity.  Being overweight.  Having too much fat, sugar, calories, or salt (sodium) in your diet.  Drinking too much alcohol. Factors that are difficult or impossible to change  Having chronic kidney disease.  Having a family history of high blood pressure.  Age. Risk increases with age.  Race. You may be at higher risk if you are African-American.  Gender. Men are at higher risk than women before age 38. After age 38, women are at higher risk than men.  Having obstructive sleep apnea.  Stress. What are the signs or symptoms? Extremely high blood pressure (hypertensive crisis) may  cause:  Headache.  Anxiety.  Shortness of breath.  Nosebleed.  Nausea and vomiting.  Severe chest pain.  Jerky movements you cannot control (seizures).  How is this diagnosed? This condition is diagnosed by measuring your blood pressure while you are seated, with your arm resting on a surface. The cuff of the blood pressure monitor will be placed directly against the skin of your upper arm at the level of your heart. It should be measured at least twice using the same arm. Certain conditions can cause a difference in blood pressure between your right and left arms. Certain factors can cause blood pressure readings to be lower or higher than normal (elevated) for a short period of time:  When your blood pressure is higher when you are in a health care provider's office than when you are at home, this is called white coat hypertension. Most people with this condition do not need medicines.  When your blood pressure is higher at home than when you are in a health care provider's office, this is called masked hypertension. Most people with this condition may need medicines to control blood pressure.  If you have a high blood pressure reading during one visit or you have normal blood pressure with other risk factors:  You may be asked to return on a different day to have your blood pressure checked again.  You may be asked to monitor your blood pressure at home for 1 week or longer.  If you are diagnosed with hypertension, you may have other blood or imaging tests to help your health care  provider understand your overall risk for other conditions. How is this treated? This condition is treated by making healthy lifestyle changes, such as eating healthy foods, exercising more, and reducing your alcohol intake. Your health care provider may prescribe medicine if lifestyle changes are not enough to get your blood pressure under control, and if:  Your systolic blood pressure is above  130.  Your diastolic blood pressure is above 80.  Your personal target blood pressure may vary depending on your medical conditions, your age, and other factors. Follow these instructions at home: Eating and drinking  Eat a diet that is high in fiber and potassium, and low in sodium, added sugar, and fat. An example eating plan is called the DASH (Dietary Approaches to Stop Hypertension) diet. To eat this way: ? Eat plenty of fresh fruits and vegetables. Try to fill half of your plate at each meal with fruits and vegetables. ? Eat whole grains, such as whole wheat pasta, brown rice, or whole grain bread. Fill about one quarter of your plate with whole grains. ? Eat or drink low-fat dairy products, such as skim milk or low-fat yogurt. ? Avoid fatty cuts of meat, processed or cured meats, and poultry with skin. Fill about one quarter of your plate with lean proteins, such as fish, chicken without skin, beans, eggs, and tofu. ? Avoid premade and processed foods. These tend to be higher in sodium, added sugar, and fat.  Reduce your daily sodium intake. Most people with hypertension should eat less than 1,500 mg of sodium a day.  Limit alcohol intake to no more than 1 drink a day for nonpregnant women and 2 drinks a day for men. One drink equals 12 oz of beer, 5 oz of wine, or 1 oz of hard liquor. Lifestyle  Work with your health care provider to maintain a healthy body weight or to lose weight. Ask what an ideal weight is for you.  Get at least 30 minutes of exercise that causes your heart to beat faster (aerobic exercise) most days of the week. Activities may include walking, swimming, or biking.  Include exercise to strengthen your muscles (resistance exercise), such as pilates or lifting weights, as part of your weekly exercise routine. Try to do these types of exercises for 30 minutes at least 3 days a week.  Do not use any products that contain nicotine or tobacco, such as cigarettes and  e-cigarettes. If you need help quitting, ask your health care provider.  Monitor your blood pressure at home as told by your health care provider.  Keep all follow-up visits as told by your health care provider. This is important. Medicines  Take over-the-counter and prescription medicines only as told by your health care provider. Follow directions carefully. Blood pressure medicines must be taken as prescribed.  Do not skip doses of blood pressure medicine. Doing this puts you at risk for problems and can make the medicine less effective.  Ask your health care provider about side effects or reactions to medicines that you should watch for. Contact a health care provider if:  You think you are having a reaction to a medicine you are taking.  You have headaches that keep coming back (recurring).  You feel dizzy.  You have swelling in your ankles.  You have trouble with your vision. Get help right away if:  You develop a severe headache or confusion.  You have unusual weakness or numbness.  You feel faint.  You have severe pain in  your chest or abdomen.  You vomit repeatedly.  You have trouble breathing. Summary  Hypertension is when the force of blood pumping through your arteries is too strong. If this condition is not controlled, it may put you at risk for serious complications.  Your personal target blood pressure may vary depending on your medical conditions, your age, and other factors. For most people, a normal blood pressure is less than 120/80.  Hypertension is treated with lifestyle changes, medicines, or a combination of both. Lifestyle changes include weight loss, eating a healthy, low-sodium diet, exercising more, and limiting alcohol. This information is not intended to replace advice given to you by your health care provider. Make sure you discuss any questions you have with your health care provider. Document Released: 12/26/2004 Document Revised: 11/24/2015  Document Reviewed: 11/24/2015 Elsevier Interactive Patient Education  2018 [employer].  DASH Eating Plan DASH stands for "Dietary Approaches to Stop Hypertension." The DASH eating plan is a healthy eating plan that has been shown to reduce high blood pressure (hypertension). It may also reduce your risk for type 2 diabetes, heart disease, and stroke. The DASH eating plan may also help with weight loss. What are tips for following this plan? General guidelines  Avoid eating more than 2,300 mg (milligrams) of salt (sodium) a day. If you have hypertension, you may need to reduce your sodium intake to 1,500 mg a day.  Limit alcohol intake to no more than 1 drink a day for nonpregnant women and 2 drinks a day for men. One drink equals 12 oz of beer, 5 oz of wine, or 1 oz of hard liquor.  Work with your health care provider to maintain a healthy body weight or to lose weight. Ask what an ideal weight is for you.  Get at least 30 minutes of exercise that causes your heart to beat faster (aerobic exercise) most days of the week. Activities may include walking, swimming, or biking.  Work with your health care provider or diet and nutrition specialist (dietitian) to adjust your eating plan to your individual calorie needs. Reading food labels  Check food labels for the amount of sodium per serving. Choose foods with less than 5 percent of the Daily Value of sodium. Generally, foods with less than 300 mg of sodium per serving fit into this eating plan.  To find whole grains, look for the word "whole" as the first word in the ingredient list. Shopping  Buy products labeled as "low-sodium" or "no salt added."  Buy fresh foods. Avoid canned foods and premade or frozen meals. Cooking  Avoid adding salt when cooking. Use salt-free seasonings or herbs instead of table salt or sea salt. Check with your health care provider or pharmacist before using salt substitutes.  Do not fry foods. Cook foods  using healthy methods such as baking, boiling, grilling, and broiling instead.  Cook with heart-healthy oils, such as olive, canola, soybean, or sunflower oil. Meal planning   Eat a balanced diet that includes: ? 5 or more servings of fruits and vegetables each day. At each meal, try to fill half of your plate with fruits and vegetables. ? Up to 6-8 servings of whole grains each day. ? Less than 6 oz of lean meat, poultry, or fish each day. A 3-oz serving of meat is about the same size as a deck of cards. One egg equals 1 oz. ? 2 servings of low-fat dairy each day. ? A serving of nuts, seeds, or beans 5  times each week. ? Heart-healthy fats. Healthy fats called Omega-3 fatty acids are found in foods such as flaxseeds and coldwater fish, like sardines, salmon, and mackerel.  Limit how much you eat of the following: ? Canned or prepackaged foods. ? Food that is high in trans fat, such as fried foods. ? Food that is high in saturated fat, such as fatty meat. ? Sweets, desserts, sugary drinks, and other foods with added sugar. ? Full-fat dairy products.  Do not salt foods before eating.  Try to eat at least 2 vegetarian meals each week.  Eat more home-cooked food and less restaurant, buffet, and fast food.  When eating at a restaurant, ask that your food be prepared with less salt or no salt, if possible. What foods are recommended? The items listed may not be a complete list. Talk with your dietitian about what dietary choices are best for you. Grains Whole-grain or whole-wheat bread. Whole-grain or whole-wheat pasta. Brown rice. Orpah Cobb. Bulgur. Whole-grain and low-sodium cereals. Pita bread. Low-fat, low-sodium crackers. Whole-wheat flour tortillas. Vegetables Fresh or frozen vegetables (raw, steamed, roasted, or grilled). Low-sodium or reduced-sodium tomato and vegetable juice. Low-sodium or reduced-sodium tomato sauce and tomato paste. Low-sodium or reduced-sodium canned  vegetables. Fruits All fresh, dried, or frozen fruit. Canned fruit in natural juice (without added sugar). Meat and other protein foods Skinless chicken or [place]. Ground chicken or [place]. Pork with fat trimmed off. Fish and seafood. Egg whites. Dried beans, peas, or lentils. Unsalted nuts, nut butters, and seeds. Unsalted canned beans. Lean cuts of beef with fat trimmed off. Low-sodium, lean deli meat. Dairy Low-fat (1%) or fat-free (skim) milk. Fat-free, low-fat, or reduced-fat cheeses. Nonfat, low-sodium ricotta or cottage cheese. Low-fat or nonfat yogurt. Low-fat, low-sodium cheese. Fats and oils Soft margarine without trans fats. Vegetable oil. Low-fat, reduced-fat, or light mayonnaise and salad dressings (reduced-sodium). Canola, safflower, olive, soybean, and sunflower oils. Avocado. Seasoning and other foods Herbs. Spices. Seasoning mixes without salt. Unsalted popcorn and pretzels. Fat-free sweets. What foods are not recommended? The items listed may not be a complete list. Talk with your dietitian about what dietary choices are best for you. Grains Baked goods made with fat, such as croissants, muffins, or some breads. Dry pasta or rice meal packs. Vegetables Creamed or fried vegetables. Vegetables in a cheese sauce. Regular canned vegetables (not low-sodium or reduced-sodium). Regular canned tomato sauce and paste (not low-sodium or reduced-sodium). Regular tomato and vegetable juice (not low-sodium or reduced-sodium). Rosita Fire. Olives. Fruits Canned fruit in a light or heavy syrup. Fried fruit. Fruit in cream or butter sauce. Meat and other protein foods Fatty cuts of meat. Ribs. Fried meat. Tomasa Blase. Sausage. Bologna and other processed lunch meats. Salami. Fatback. Hotdogs. Bratwurst. Salted nuts and seeds. Canned beans with added salt. Canned or smoked fish. Whole eggs or egg yolks. Chicken or [place] with skin. Dairy Whole or 2% milk, cream, and half-and-half. Whole or full-fat  cream cheese. Whole-fat or sweetened yogurt. Full-fat cheese. Nondairy creamers. Whipped toppings. Processed cheese and cheese spreads. Fats and oils Butter. Stick margarine. Lard. Shortening. Ghee. Bacon fat. Tropical oils, such as coconut, palm kernel, or palm oil. Seasoning and other foods Salted popcorn and pretzels. Onion salt, garlic salt, seasoned salt, table salt, and sea salt. Worcestershire sauce. Tartar sauce. Barbecue sauce. Teriyaki sauce. Soy sauce, including reduced-sodium. Steak sauce. Canned and packaged gravies. Fish sauce. Oyster sauce. Cocktail sauce. Horseradish that you find on the shelf. Ketchup. Mustard. Meat flavorings and tenderizers. Bouillon cubes. Hot  sauce and Tabasco sauce. Premade or packaged marinades. Premade or packaged taco seasonings. Relishes. Regular salad dressings. Where to find more information:  National Heart, Lung, and Blood Institute: PopSteam.is  American Heart Association: www.heart.org Summary  The DASH eating plan is a healthy eating plan that has been shown to reduce high blood pressure (hypertension). It may also reduce your risk for type 2 diabetes, heart disease, and stroke.  With the DASH eating plan, you should limit salt (sodium) intake to 2,300 mg a day. If you have hypertension, you may need to reduce your sodium intake to 1,500 mg a day.  When on the DASH eating plan, aim to eat more fresh fruits and vegetables, whole grains, lean proteins, low-fat dairy, and heart-healthy fats.  Work with your health care provider or diet and nutrition specialist (dietitian) to adjust your eating plan to your individual calorie needs. This information is not intended to replace advice given to you by your health care provider. Make sure you discuss any questions you have with your health care provider. Document Released: 12/15/2010 Document Revised: 12/20/2015 Document Reviewed: 12/20/2015 Elsevier Interactive Patient Education  [employer].

## 2017-04-23 NOTE — Progress Notes (Signed)
Pre visit review using our clinic review tool, if applicable. No additional management support is needed unless otherwise documented below in the visit note. 

## 2017-04-24 ENCOUNTER — Other Ambulatory Visit: Payer: Self-pay | Admitting: Internal Medicine

## 2017-04-24 ENCOUNTER — Telehealth: Payer: Self-pay | Admitting: Internal Medicine

## 2017-04-24 DIAGNOSIS — I10 Essential (primary) hypertension: Secondary | ICD-10-CM

## 2017-04-24 MED ORDER — AMLODIPINE BESYLATE 5 MG PO TABS
5.0000 mg | ORAL_TABLET | Freq: Every day | ORAL | 0 refills | Status: DC
Start: 2017-04-24 — End: 2017-05-01

## 2017-04-24 NOTE — Telephone Encounter (Signed)
Sent norvasc instead telmisartan  Pick up chlorthalidone   TMS

## 2017-04-24 NOTE — Telephone Encounter (Signed)
Please advise 

## 2017-04-24 NOTE — Telephone Encounter (Signed)
Copied from CRM [id number]. Topic: Quick Communication - Rx Refill/Question >> Apr 24, 2017 12:38 PM Leafy Roobinson, Norma J wrote: Medication: Telmisartan Has the patient contacted their pharmacy yes. Pt can not afford this medication and would like something else for her bp

## 2017-04-25 NOTE — Telephone Encounter (Signed)
Patient has pickled up medication.

## 2017-04-27 ENCOUNTER — Encounter: Payer: Self-pay | Admitting: Internal Medicine

## 2017-04-27 NOTE — Progress Notes (Signed)
Chief Complaint  Patient presents with  . New Patient (Initial Visit)   New patient  1. Uncontrolled blood pressure 190/130 today w/o sx's.  H/o HTN x years not currently on medications was on hyzaar 50-12.5 but c/w recalls. No h/a, chest pain, dizziness, sob She does report diet noncompliance eating out a lot  2. H/o depression currently not an issues  3. H/o OSA 4. H/o gastric bypass 2010 lost down to 180 but gained weight back.    Review of Systems  Constitutional: Negative for weight loss.  HENT: Negative for hearing loss.   Eyes: Negative for blurred vision.  Respiratory: Negative for shortness of breath.   Cardiovascular: Negative for chest pain.  Gastrointestinal: Negative for abdominal pain.  Musculoskeletal: Positive for joint pain.  Skin: Negative for rash.  Neurological: Negative for dizziness and headaches.  Psychiatric/Behavioral: Negative for depression. The patient has insomnia.        Sleeping 4-5 hrs    Past Medical History:  Diagnosis Date  . Arthritis   . Chicken pox   . Depression   . Diabetes mellitus   . Gastric bypass for obesity complicating pregnancy, birth, puerperium   . Hypertension   . OSA (obstructive sleep apnea)    Past Surgical History:  Procedure Laterality Date  . ABDOMINAL HYSTERECTOMY     Dr. Lang Snow 2012   . OOPHORECTOMY     37 y.o 1 ovary out per pt, later in life 2nd ovary out   . ROUX-EN-Y GASTRIC BYPASS     2010 losst down to 180s gained back   . TONSILLECTOMY     1987   History reviewed. No pertinent family history. Social History   Socioeconomic History  . Marital status: Single    Spouse name: Not on file  . Number of children: Not on file  . Years of education: Not on file  . Highest education level: Not on file  Occupational History  . Not on file  Social Needs  . Financial resource strain: Not on file  . Food insecurity:    Worry: Not on file    Inability: Not on file  . Transportation needs:    Medical: Not  on file    Non-medical: Not on file  Tobacco Use  . Smoking status: Former Games developer  . Smokeless tobacco: Never Used  Substance and Sexual Activity  . Alcohol use: Yes  . Drug use: No  . Sexual activity: Yes    Comment: men  Lifestyle  . Physical activity:    Days per week: Not on file    Minutes per session: Not on file  . Stress: Not on file  Relationships  . Social connections:    Talks on phone: Not on file    Gets together: Not on file    Attends religious service: Not on file    Active member of club or organization: Not on file    Attends meetings of clubs or organizations: Not on file    Relationship status: Not on file  . Intimate partner violence:    Fear of current or ex partner: Not on file    Emotionally abused: Not on file    Physically abused: Not on file    Forced sexual activity: Not on file  Other Topics Concern  . 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 [  DISCONTINUED] estradiol (ESTRACE) 0.5 MG tablet Take 1 mg by mouth daily.  . [DISCONTINUED] ibuprofen (ADVIL,MOTRIN) 200 MG tablet Take 200 mg by mouth every 6 (six) hours as needed. For pain  . [DISCONTINUED] metoprolol (LOPRESSOR) 50 MG tablet Take 50 mg by mouth daily.  . [DISCONTINUED] nebivolol (BYSTOLIC) 10 MG tablet Take 10 mg by mouth daily.   Allergies  Allergen Reactions  . Penicillins Rash   No results found for this or any previous visit (from the past 2160 hour(s)). Objective  Body mass index is 49.86 kg/m. Wt Readings from Last 3 Encounters:  04/23/17 299 lb 9.6 oz (135.9 kg)  12/20/16 (!) 315 lb (142.9 kg)  10/19/10 200 lb 9.9 oz (91 kg)   Temp Readings from Last 3 Encounters:  04/23/17 98.3 F (36.8 C) (Oral)  12/20/16 98.2 F (36.8 C) (Oral)  08/02/11 98.4 F (36.9 C)   BP Readings from Last 3 Encounters:  04/23/17 (!) 190/30  12/20/16 (!) 170/100  08/02/11 159/89   Pulse Readings  from Last 3 Encounters:  04/23/17 79  12/20/16 68  08/02/11 72    Physical Exam  Constitutional: She is oriented to person, place, and time. She appears well-developed and well-nourished. She is cooperative.  HENT:  Head: Normocephalic and atraumatic.  Mouth/Throat: Oropharynx is clear and moist and mucous membranes are normal.  Eyes: Pupils are equal, round, and reactive to light. Conjunctivae are normal.  Cardiovascular: Normal rate, regular rhythm and normal heart sounds.  Pulmonary/Chest: Effort normal and breath sounds normal.  Neurological: She is alert and oriented to person, place, and time. Gait normal.  CN 2-12 grossly intact  Normal motor strength upper and lower ext b/l   Skin: Skin is warm and dry.  Psychiatric: She has a normal mood and affect. Her speech is normal and behavior is normal. Judgment and thought content normal. Cognition and memory are normal.  Nursing note and vitals reviewed.   Assessment   1. HTN urgency, BP not controlled  2. OSA 3. Obesity BMI >49  4. H/o fatty liver  5. HM Plan  1.  Pt wants to stop losartan due to recall  Tried telmisartan 20 mg qd but costs too much changed to norvasc 5 mg qd with chlorthalidone 12.5 mg qd  Consider repeat sleep study in future with h/o OSA  rec low Na diet  F/u in 1 week with labs  2.  See above  3. rec healthy diet choices and exercise goal wt for ht should be 150   4. Check hep B status consider hep A/B vaccine in future  See #3  5.  Declines flu shot  Tdap given info  Check hep B status   HIV neg 10/22/10 declines STD check  Check labs next visit labs ordered   Last eye exam 2017  LMP 2012 s/p hysterectomy both ovaries removed previously -CT ab/pelvis 10/19/10 c/w complex cystic mass in uterus with elevated CA 125 -per path report removal uterus, b/l fallopian tubes and cervix neg malignancy or dysplasia 10/2010 Dr. Lang SnowHenly  Former smoker   "I spent 35-40 minutes face-to face with patient  with greater than 50% of time spent counseling and/or in coordination of care chart review extensive and disc with pt PMH/PsuH and next plan of care    Provider: Dr. French Anaracy McLean-Scocuzza-Internal Medicine

## 2017-05-01 ENCOUNTER — Ambulatory Visit (INDEPENDENT_AMBULATORY_CARE_PROVIDER_SITE_OTHER): Payer: Self-pay | Admitting: Internal Medicine

## 2017-05-01 ENCOUNTER — Encounter: Payer: Self-pay | Admitting: Internal Medicine

## 2017-05-01 ENCOUNTER — Other Ambulatory Visit: Payer: Self-pay

## 2017-05-01 VITALS — BP 162/108 | HR 69 | Temp 98.6°F | Ht 65.0 in | Wt 299.6 lb

## 2017-05-01 DIAGNOSIS — Z13220 Encounter for screening for lipoid disorders: Secondary | ICD-10-CM

## 2017-05-01 DIAGNOSIS — Z1329 Encounter for screening for other suspected endocrine disorder: Secondary | ICD-10-CM

## 2017-05-01 DIAGNOSIS — F419 Anxiety disorder, unspecified: Secondary | ICD-10-CM

## 2017-05-01 DIAGNOSIS — Z1389 Encounter for screening for other disorder: Secondary | ICD-10-CM

## 2017-05-01 DIAGNOSIS — D649 Anemia, unspecified: Secondary | ICD-10-CM

## 2017-05-01 DIAGNOSIS — I10 Essential (primary) hypertension: Secondary | ICD-10-CM

## 2017-05-01 MED ORDER — AMLODIPINE BESYLATE 10 MG PO TABS: 10.0000 mg | ORAL_TABLET | Freq: Every day | ORAL | 1 refills | Status: DC

## 2017-05-01 MED ORDER — SPIRONOLACTONE 25 MG PO TABS: 12.5000 mg | ORAL_TABLET | Freq: Every day | ORAL | 1 refills | Status: DC

## 2017-05-01 MED ORDER — CHLORTHALIDONE 25 MG PO TABS
25.0000 mg | ORAL_TABLET | Freq: Every day | ORAL | 1 refills | Status: DC
Start: 2017-05-01 — End: 2017-08-13

## 2017-05-01 NOTE — Patient Instructions (Addendum)
Please exercise lightly with walking  Please take multivitamin with iron  Take 2 Norvasc 5 mg new dose will be 10 mg in am  Take 2 of Chlorthalidone 12.5=25 mg,new dose is 25 mg in am  Then in 2 weeks if your blood pressure is still >140/>90  Fill prescription for spironolactone 12.5 mg daily in am    DASH Eating Plan DASH stands for "Dietary Approaches to Stop Hypertension." The DASH eating plan is a healthy eating plan that has been shown to reduce high blood pressure (hypertension). It may also reduce your risk for type 2 diabetes, heart disease, and stroke. The DASH eating plan may also help with weight loss. What are tips for following this plan? General guidelines  Avoid eating more than 2,300 mg (milligrams) of salt (sodium) a day. If you have hypertension, you may need to reduce your sodium intake to 1,500 mg a day.  Limit alcohol intake to no more than 1 drink a day for nonpregnant women and 2 drinks a day for men. One drink equals 12 oz of beer, 5 oz of wine, or 1 oz of hard liquor.  Work with your health care provider to maintain a healthy body weight or to lose weight. Ask what an ideal weight is for you.  Get at least 30 minutes of exercise that causes your heart to beat faster (aerobic exercise) most days of the week. Activities may include walking, swimming, or biking.  Work with your health care provider or diet and nutrition specialist (dietitian) to adjust your eating plan to your individual calorie needs. Reading food labels  Check food labels for the amount of sodium per serving. Choose foods with less than 5 percent of the Daily Value of sodium. Generally, foods with less than 300 mg of sodium per serving fit into this eating plan.  To find whole grains, look for the word "whole" as the first word in the ingredient list. Shopping  Buy products labeled as "low-sodium" or "no salt added."  Buy fresh foods. Avoid canned foods and premade or frozen  meals. Cooking  Avoid adding salt when cooking. Use salt-free seasonings or herbs instead of table salt or sea salt. Check with your health care provider or pharmacist before using salt substitutes.  Do not fry foods. Cook foods using healthy methods such as baking, boiling, grilling, and broiling instead.  Cook with heart-healthy oils, such as olive, canola, soybean, or sunflower oil. Meal planning   Eat a balanced diet that includes: ? 5 or more servings of fruits and vegetables each day. At each meal, try to fill half of your plate with fruits and vegetables. ? Up to 6-8 servings of whole grains each day. ? Less than 6 oz of lean meat, poultry, or fish each day. A 3-oz serving of meat is about the same size as a deck of cards. One egg equals 1 oz. ? 2 servings of low-fat dairy each day. ? A serving of nuts, seeds, or beans 5 times each week. ? Heart-healthy fats. Healthy fats called Omega-3 fatty acids are found in foods such as flaxseeds and coldwater fish, like sardines, salmon, and mackerel.  Limit how much you eat of the following: ? Canned or prepackaged foods. ? Food that is high in trans fat, such as fried foods. ? Food that is high in saturated fat, such as fatty meat. ? Sweets, desserts, sugary drinks, and other foods with added sugar. ? Full-fat dairy products.  Do not salt foods before  eating.  Try to eat at least 2 vegetarian meals each week.  Eat more home-cooked food and less restaurant, buffet, and fast food.  When eating at a restaurant, ask that your food be prepared with less salt or no salt, if possible. What foods are recommended? The items listed may not be a complete list. Talk with your dietitian about what dietary choices are best for you. Grains Whole-grain or whole-wheat bread. Whole-grain or whole-wheat pasta. Brown rice. [place]. Bulgur. Whole-grain and low-sodium cereals. Pita bread. Low-fat, low-sodium crackers. Whole-wheat flour  tortillas. Vegetables Fresh or frozen vegetables (raw, steamed, roasted, or grilled). Low-sodium or reduced-sodium tomato and vegetable juice. Low-sodium or reduced-sodium tomato sauce and tomato paste. Low-sodium or reduced-sodium canned vegetables. Fruits All fresh, dried, or frozen fruit. Canned fruit in natural juice (without added sugar). Meat and other protein foods Skinless chicken or [place]. Ground chicken or [place]. Pork with fat trimmed off. Fish and seafood. Egg whites. Dried beans, peas, or lentils. Unsalted nuts, nut butters, and seeds. Unsalted canned beans. Lean cuts of beef with fat trimmed off. Low-sodium, lean deli meat. Dairy Low-fat (1%) or fat-free (skim) milk. Fat-free, low-fat, or reduced-fat cheeses. Nonfat, low-sodium ricotta or cottage cheese. Low-fat or nonfat yogurt. Low-fat, low-sodium cheese. Fats and oils Soft margarine without trans fats. Vegetable oil. Low-fat, reduced-fat, or light mayonnaise and salad dressings (reduced-sodium). Canola, safflower, olive, soybean, and sunflower oils. Avocado. Seasoning and other foods Herbs. Spices. Seasoning mixes without salt. Unsalted popcorn and pretzels. Fat-free sweets. What foods are not recommended? The items listed may not be a complete list. Talk with your dietitian about what dietary choices are best for you. Grains Baked goods made with fat, such as croissants, muffins, or some breads. Dry pasta or rice meal packs. Vegetables Creamed or fried vegetables. Vegetables in a cheese sauce. Regular canned vegetables (not low-sodium or reduced-sodium). Regular canned tomato sauce and paste (not low-sodium or reduced-sodium). Regular tomato and vegetable juice (not low-sodium or reduced-sodium). Angie Fava. Olives. Fruits Canned fruit in a light or heavy syrup. Fried fruit. Fruit in cream or butter sauce. Meat and other protein foods Fatty cuts of meat. Ribs. Fried meat. Berniece Salines. Sausage. Bologna and other processed lunch meats.  Salami. Fatback. Hotdogs. Bratwurst. Salted nuts and seeds. Canned beans with added salt. Canned or smoked fish. Whole eggs or egg yolks. Chicken or [place] with skin. Dairy Whole or 2% milk, cream, and half-and-half. Whole or full-fat cream cheese. Whole-fat or sweetened yogurt. Full-fat cheese. Nondairy creamers. Whipped toppings. Processed cheese and cheese spreads. Fats and oils Butter. Stick margarine. Lard. Shortening. Ghee. Bacon fat. Tropical oils, such as coconut, palm kernel, or palm oil. Seasoning and other foods Salted popcorn and pretzels. Onion salt, garlic salt, seasoned salt, table salt, and sea salt. Worcestershire sauce. Tartar sauce. Barbecue sauce. Teriyaki sauce. Soy sauce, including reduced-sodium. Steak sauce. Canned and packaged gravies. Fish sauce. Oyster sauce. Cocktail sauce. Horseradish that you find on the shelf. Ketchup. Mustard. Meat flavorings and tenderizers. Bouillon cubes. Hot sauce and Tabasco sauce. Premade or packaged marinades. Premade or packaged taco seasonings. Relishes. Regular salad dressings. Where to find more information:  National Heart, Lung, and [place]: https://wilson-eaton.com/  American Heart Association: www.heart.org Summary  The DASH eating plan is a healthy eating plan that has been shown to reduce high blood pressure (hypertension). It may also reduce your risk for type 2 diabetes, heart disease, and stroke.  With the DASH eating plan, you should limit salt (sodium) intake to 2,300 mg a  day. If you have hypertension, you may need to reduce your sodium intake to 1,500 mg a day.  When on the DASH eating plan, aim to eat more fresh fruits and vegetables, whole grains, lean proteins, low-fat dairy, and heart-healthy fats.  Work with your health care provider or diet and nutrition specialist (dietitian) to adjust your eating plan to your individual calorie needs. This information is not intended to replace advice given to you by your health  care provider. Make sure you discuss any questions you have with your health care provider. Document Released: 12/15/2010 Document Revised: 12/20/2015 Document Reviewed: 12/20/2015 Elsevier Interactive Patient Education  [employer].

## 2017-05-01 NOTE — Progress Notes (Signed)
Chief Complaint  Patient presents with  . Follow-up   F/u  1. HTN improved but still uncontrolled on norvasc 5, chlorthalidone 12.5 mg qd will do last at f/u BMET, lipid, TSH, UA dip disc with pt cost $105 she can do by 06/2017 as she is self pay   Review of Systems  Constitutional: Negative for weight loss.  HENT: Negative for hearing loss.   Eyes: Negative for blurred vision.  Respiratory: Negative for shortness of breath.   Cardiovascular: Negative for chest pain.  Skin: Negative for rash.  Neurological: Positive for headaches. Negative for dizziness.  Psychiatric/Behavioral: The patient is nervous/anxious.    Past Medical History:  Diagnosis Date  . Arthritis   . Chicken pox   . Depression   . Diabetes mellitus   . Gastric bypass for obesity complicating pregnancy, birth, puerperium   . Hypertension   . OSA (obstructive sleep apnea)    Past Surgical History:  Procedure Laterality Date  . ABDOMINAL HYSTERECTOMY     Dr. Lang SnowHenly 2012   . OOPHORECTOMY     37 y.o 1 ovary out per pt, later in life 2nd ovary out   . ROUX-EN-Y GASTRIC BYPASS     2010 losst down to 180s gained back   . TONSILLECTOMY     1987   Family History  Problem Relation Age of Onset  . Depression Mother   . Diabetes Mother   . Hypertension Mother   . Miscarriages / [place] Mother   . Cancer Father        prostate  . Diabetes Father   . Heart disease Father   . Hyperlipidemia Father   . Hypertension Father   . Kidney disease Father   . Stroke Father   . Depression Sister   . Alcohol abuse Brother   . Cancer Brother        lung   . Drug abuse Brother   . Arthritis Maternal Grandmother   . Diabetes Maternal Grandmother   . Hypertension Maternal Grandmother   . Stroke Maternal Grandmother   . Alcohol abuse Maternal Grandfather   . Stroke Paternal Grandmother   . Cancer Paternal Grandfather        ? type  . Alcohol abuse Brother   . Early death Brother    Social History    Socioeconomic History  . Marital status: Single    Spouse name: Not on file  . Number of children: Not on file  . Years of education: Not on file  . Highest education level: Not on file  Occupational History  . Not on file  Social Needs  . Financial resource strain: Not on file  . Food insecurity:    Worry: Not on file    Inability: Not on file  . Transportation needs:    Medical: Not on file    Non-medical: Not on file  Tobacco Use  . Smoking status: Former Games developermoker  . Smokeless tobacco: Never Used  Substance and Sexual Activity  . Alcohol use: Yes  . Drug use: No  . Sexual activity: Yes    Comment: men  Lifestyle  . Physical activity:    Days per week: Not on file    Minutes per session: Not on file  . Stress: Not on file  Relationships  . Social connections:    Talks on phone: Not on file    Gets together: Not on file    Attends religious service: Not on file    Active  member of club or organization: Not on file    Attends meetings of clubs or organizations: Not on file    Relationship status: Not on file  . Intimate partner violence:    Fear of current or ex partner: Not on file    Emotionally abused: Not on file    Physically abused: Not on file    Forced sexual activity: Not on file  Other Topics Concern  . 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   Allergies  Allergen Reactions  . Penicillins Rash   No results found for this or any previous visit (from the past 2160 hour(s)). Objective  Body mass index is 49.86 kg/m. Wt Readings from Last 3 Encounters:  05/01/17 299 lb 9.6 oz (135.9 kg)  04/23/17 299 lb 9.6 oz (135.9 kg)  12/20/16 (!) 315 lb (142.9 kg)   Temp Readings from Last 3 Encounters:  05/01/17 98.6 F (37 C) (Oral)   04/23/17 98.3 F (36.8 C) (Oral)  12/20/16 98.2 F (36.8 C) (Oral)   BP Readings from Last 3 Encounters:  05/01/17 (!) 162/108  04/23/17 (!) 190/30  12/20/16 (!) 170/100   Pulse Readings from Last 3 Encounters:  05/01/17 69  04/23/17 79  12/20/16 68    Physical Exam  Constitutional: She is oriented to person, place, and time. She appears well-developed and well-nourished. She is cooperative.  HENT:  Head: Normocephalic and atraumatic.  Mouth/Throat: Oropharynx is clear and moist and mucous membranes are normal.  Eyes: Pupils are equal, round, and reactive to light. Conjunctivae are normal.  Cardiovascular: Normal rate, regular rhythm and normal heart sounds.  Pulmonary/Chest: Effort normal and breath sounds normal.  Neurological: She is alert and oriented to person, place, and time. Gait normal.  Skin: Skin is warm, dry and intact.  Psychiatric: She has a normal mood and affect. Her speech is normal and behavior is normal. Judgment and thought content normal. Cognition and memory are normal.  Nursing note and vitals reviewed.   Assessment   1. HTN  2. H/o anxiety  3. HM Plan   1 .increase norvasc to 10 mg, increase chlorthalidone to 25 mg  Check BP at home if still elevated after 2 weeks start spironolactone 12.5 mg qd F/u in 2-3 weeks check CMET, lipid, TSH, UA dip by 06/2017  2. Exercise helps rec light exercise  3.  Declines flu shot  Tdap given info  Check hep B status in future currently pt w/o insurance   HIV neg 10/22/10 declines STD check  Check labs in future labs need to hold for now B12, iron/TIBC, vit D, hep B, A1C, UA, T4, CBC, CMET pt is self pay and w/o insurance  Last eye exam 2017  LMP 2012 s/p hysterectomy both ovaries removed previously -CT ab/pelvis 10/19/10 c/w complex cystic mass in uterus with elevated CA 125 -per path report removal uterus, b/l fallopian tubes and cervix neg malignancy or dysplasia 10/2010 Dr. Lang Snow  Former smoker  rec  take Mvt with iron     Provider: Dr. French Ana McLean-Scocuzza-Internal Medicine

## 2017-05-01 NOTE — Addendum Note (Signed)
Addended by: Quentin OreMCLEAN-SCOCUZZA, Husayn Reim on: 05/01/2017 10:45 AM   Modules accepted: Orders

## 2017-05-01 NOTE — Progress Notes (Signed)
Pre visit review using our clinic review tool, if applicable. No additional management support is needed unless otherwise documented below in the visit note. 

## 2017-05-14 ENCOUNTER — Telehealth: Payer: Self-pay | Admitting: Internal Medicine

## 2017-05-14 NOTE — Telephone Encounter (Signed)
Patient needs Rx corrected for amount prescribed- reviewed dosing with patient and all is correct. She will contact pharmacy for fill.

## 2017-05-22 ENCOUNTER — Ambulatory Visit: Payer: Self-pay | Admitting: Internal Medicine

## 2017-06-01 ENCOUNTER — Other Ambulatory Visit: Payer: Self-pay | Admitting: Internal Medicine

## 2017-06-01 DIAGNOSIS — I10 Essential (primary) hypertension: Secondary | ICD-10-CM

## 2017-06-01 MED ORDER — AMLODIPINE BESYLATE 10 MG PO TABS: 10.0000 mg | ORAL_TABLET | Freq: Every day | ORAL | 2 refills | Status: DC

## 2017-06-11 ENCOUNTER — Other Ambulatory Visit: Payer: Self-pay

## 2017-06-12 ENCOUNTER — Encounter: Payer: Self-pay | Admitting: Internal Medicine

## 2017-06-12 ENCOUNTER — Ambulatory Visit (INDEPENDENT_AMBULATORY_CARE_PROVIDER_SITE_OTHER): Payer: Self-pay | Admitting: Internal Medicine

## 2017-06-12 VITALS — BP 146/100 | HR 70 | Temp 98.5°F | Ht 65.0 in | Wt 299.2 lb

## 2017-06-12 DIAGNOSIS — R519 Headache, unspecified: Secondary | ICD-10-CM | POA: Insufficient documentation

## 2017-06-12 DIAGNOSIS — Z1329 Encounter for screening for other suspected endocrine disorder: Secondary | ICD-10-CM

## 2017-06-12 DIAGNOSIS — I10 Essential (primary) hypertension: Secondary | ICD-10-CM

## 2017-06-12 DIAGNOSIS — R51 Headache: Secondary | ICD-10-CM

## 2017-06-12 DIAGNOSIS — Z1389 Encounter for screening for other disorder: Secondary | ICD-10-CM

## 2017-06-12 DIAGNOSIS — G44209 Tension-type headache, unspecified, not intractable: Secondary | ICD-10-CM

## 2017-06-12 MED ORDER — SPIRONOLACTONE 25 MG PO TABS: 12.5000 mg | ORAL_TABLET | Freq: Every day | ORAL | 2 refills | Status: DC

## 2017-06-12 NOTE — Progress Notes (Signed)
Pre visit review using our clinic review tool, if applicable. No additional management support is needed unless otherwise documented below in the visit note. 

## 2017-06-12 NOTE — Progress Notes (Signed)
Chief Complaint  Patient presents with  . Headache  . Follow-up  . Hypertension   F/u  1. HTN uncontrolled taking norvasc 10 mg qd, chlorthalidone 25 mg qd has not started spironolactone 12.5 mg qd BP at home running 113-120s/80s-100s.  2. C/o h/a today occipital radiating to left head 8/10 today, drinking 1 gallon water, has a lot of stress. No vision changes. She did have reduced sleep last night and a lot of stress. Nothing tried so far she normally does not get h/a. She denies n/v. She has a h/o sleep apnea but not using cpap     Review of Systems  Constitutional: Negative for weight loss.  HENT: Negative for hearing loss.   Eyes: Negative for blurred vision.       No vision changes   Respiratory: Negative for shortness of breath.   Cardiovascular: Negative for chest pain.  Gastrointestinal: Negative for nausea.  Skin: Negative for rash.  Neurological: Positive for headaches.  Psychiatric/Behavioral: Negative for depression.       +stress    Past Medical History:  Diagnosis Date  . Arthritis   . Chicken pox   . Depression   . Diabetes mellitus   . Gastric bypass for obesity complicating pregnancy, birth, puerperium   . Hypertension   . OSA (obstructive sleep apnea)    Past Surgical History:  Procedure Laterality Date  . ABDOMINAL HYSTERECTOMY     Dr. Lang SnowHenly 2012   . OOPHORECTOMY     37 y.o 1 ovary out per pt, later in life 2nd ovary out   . ROUX-EN-Y GASTRIC BYPASS     2010 losst down to 180s gained back   . TONSILLECTOMY     1987   Family History  Problem Relation Age of Onset  . Depression Mother   . Diabetes Mother   . Hypertension Mother   . Miscarriages / [place] Mother   . Cancer Father        prostate  . Diabetes Father   . Heart disease Father   . Hyperlipidemia Father   . Hypertension Father   . Kidney disease Father   . Stroke Father   . Depression Sister   . Alcohol abuse Brother   . Cancer Brother        lung   . Drug abuse Brother    . Arthritis Maternal Grandmother   . Diabetes Maternal Grandmother   . Hypertension Maternal Grandmother   . Stroke Maternal Grandmother   . Alcohol abuse Maternal Grandfather   . Stroke Paternal Grandmother   . Cancer Paternal Grandfather        ? type  . Alcohol abuse Brother   . Early death Brother    Social History   Socioeconomic History  . Marital status: Single    Spouse name: Not on file  . Number of children: Not on file  . Years of education: Not on file  . Highest education level: Not on file  Occupational History  . Not on file  Social Needs  . Financial resource strain: Not on file  . Food insecurity:    Worry: Not on file    Inability: Not on file  . Transportation needs:    Medical: Not on file    Non-medical: Not on file  Tobacco Use  . Smoking status: Former Games developermoker  . Smokeless tobacco: Never Used  Substance and Sexual Activity  . Alcohol use: Yes  . Drug use: No  . Sexual activity: Yes  Comment: men  Lifestyle  . Physical activity:    Days per week: Not on file    Minutes per session: Not on file  . Stress: Not on file  Relationships  . Social connections:    Talks on phone: Not on file    Gets together: Not on file    Attends religious service: Not on file    Active member of club or organization: Not on file    Attends meetings of clubs or organizations: Not on file    Relationship status: Not on file  . Intimate partner violence:    Fear of current or ex partner: Not on file    Emotionally abused: Not on file    Physically abused: Not on file    Forced sexual activity: Not on file  Other Topics Concern  . 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 amLODipine (NORVASC) 10 MG tablet Take 1 tablet (10 mg total) by mouth daily.  . chlorthalidone (HYGROTON) 25 MG tablet Take 1 tablet (25 mg total) by mouth  daily.  Marland Kitchen spironolactone (ALDACTONE) 25 MG tablet Take 0.5 tablets (12.5 mg total) by mouth daily.   Allergies  Allergen Reactions  . Penicillins Rash   No results found for this or any previous visit (from the past 2160 hour(s)). Objective  Body mass index is 49.79 kg/m. Wt Readings from Last 3 Encounters:  06/12/17 299 lb 3.2 oz (135.7 kg)  05/01/17 299 lb 9.6 oz (135.9 kg)  04/23/17 299 lb 9.6 oz (135.9 kg)   Temp Readings from Last 3 Encounters:  06/12/17 98.5 F (36.9 C) (Oral)  05/01/17 98.6 F (37 C) (Oral)  04/23/17 98.3 F (36.8 C) (Oral)   BP Readings from Last 3 Encounters:  06/12/17 (!) 146/100  05/01/17 (!) 162/108  04/23/17 (!) 190/30   Pulse Readings from Last 3 Encounters:  06/12/17 70  05/01/17 69  04/23/17 79    Physical Exam  Constitutional: She is oriented to person, place, and time. She appears well-developed and well-nourished. She is cooperative.  HENT:  Head: Normocephalic and atraumatic.  Mouth/Throat: Oropharynx is clear and moist and mucous membranes are normal.  Eyes: Pupils are equal, round, and reactive to light. Conjunctivae are normal.  Cardiovascular: Normal rate, regular rhythm and normal heart sounds.  Pulmonary/Chest: Effort normal and breath sounds normal.  Neurological: She is alert and oriented to person, place, and time. She has normal strength. Gait normal.  CN 2-12 grossly intact moving all 4 ext   Skin: Skin is warm, dry and intact.  Psychiatric: She has a normal mood and affect. Her speech is normal and behavior is normal. Judgment and thought content normal. Cognition and memory are normal.  Nursing note and vitals reviewed.   Assessment   1. HTN uncontrolled no meds today  2. H/a ? Stress related vs #1. Also h/o OSA not using cpap  3. HM Plan   1. rec cont meds norvasc 10, chlothalidone 25 add spironolactone 12.5 mg qd  Check labs in 2 weeks  2. Prn tylenol, nsaids sparingly  3.  Declines flu shot  Tdap given  info  Check hep B status in future currently pt w/o insurance   HIV neg 10/22/10 declines STD check  Check labs in future labs need to hold  for now B12, iron/TIBC, vit D, hep B, A1C, UA, T4, CBC, CMET pt is self pay and w/o insurance  Last eye exam 2017  LMP 2012 s/p hysterectomy both ovaries removed previously -CT ab/pelvis 10/19/10 c/w complex cystic mass in uterus with elevated CA 125 -per path report removal uterus, b/l fallopian tubes and cervix neg malignancy or dysplasia 10/2010 Dr. Lang Snow  Former smoker rec take Mvt with iron   Provider: Dr. French Ana McLean-Scocuzza-Internal Medicine

## 2017-06-12 NOTE — Patient Instructions (Addendum)
Please take tylenol 500 mg up to 6 pills in 1 day or 650 up to 4 pills in 1 day  Advil you can take 400 mg x 1 now max dose in 1 day is 2400 mg  Schedule labs fasting (in 2 weeks) x 12 hours nothing but water and medications before labs F/u in 2 months take care  Please get more rest   General Headache Without Cause A headache is pain or discomfort felt around the head or neck area. The specific cause of a headache may not be found. There are many causes and types of headaches. A few common ones are:  Tension headaches.  Migraine headaches.  Cluster headaches.  Chronic daily headaches.  Follow these instructions at home: Watch your condition for any changes. Take these steps to help with your condition: Managing pain  Take over-the-counter and prescription medicines only as told by your health care provider.  Lie down in a dark, quiet room when you have a headache.  If directed, apply ice to the head and neck area: ? Put ice in a plastic bag. ? Place a towel between your skin and the bag. ? Leave the ice on for 20 minutes, 2-3 times per day.  Use a heating pad or hot shower to apply heat to the head and neck area as told by your health care provider.  Keep lights dim if bright lights bother you or make your headaches worse. Eating and drinking  Eat meals on a regular schedule.  Limit alcohol use.  Decrease the amount of caffeine you drink, or stop drinking caffeine. General instructions  Keep all follow-up visits as told by your health care provider. This is important.  Keep a headache journal to help find out what may trigger your headaches. For example, write down: ? What you eat and drink. ? How much sleep you get. ? Any change to your diet or medicines.  Try massage or other relaxation techniques.  Limit stress.  Sit up straight, and do not tense your muscles.  Do not use tobacco products, including cigarettes, chewing tobacco, or e-cigarettes. If you need  help quitting, ask your health care provider.  Exercise regularly as told by your health care provider.  Sleep on a regular schedule. Get 7-9 hours of sleep, or the amount recommended by your health care provider. Contact a health care provider if:  Your symptoms are not helped by medicine.  You have a headache that is different from the usual headache.  You have nausea or you vomit.  You have a fever. Get help right away if:  Your headache becomes severe.  You have repeated vomiting.  You have a stiff neck.  You have a loss of vision.  You have problems with speech.  You have pain in the eye or ear.  You have muscular weakness or loss of muscle control.  You lose your balance or have trouble walking.  You feel faint or pass out.  You have confusion. This information is not intended to replace advice given to you by your health care provider. Make sure you discuss any questions you have with your health care provider. Document Released: 12/26/2004 Document Revised: 06/03/2015 Document Reviewed: 04/20/2014 Elsevier Interactive Patient Education  [employer]2018 Elsevier Inc.

## 2017-06-29 ENCOUNTER — Other Ambulatory Visit: Payer: Self-pay

## 2017-07-02 ENCOUNTER — Telehealth: Payer: Self-pay | Admitting: Internal Medicine

## 2017-07-02 NOTE — Telephone Encounter (Signed)
Copied from CRM [phone]. Topic: Quick Communication - See Telephone Encounter >> Jul 02, 2017 10:18 AM Windy KalataMichael, Babita Amaker L, NT wrote: CRM for notification. See Telephone encounter for: 07/02/17.  Patient is needing a refill on spironolactone (ALDACTONE) 25 MG tablet. She states she lost the paper script and never was able to get that. Please advise.   [facility] [address] - [place], [place] - [address] [place] [id number] Phone: [phone] Fax: [phone]

## 2017-07-03 ENCOUNTER — Other Ambulatory Visit: Payer: Self-pay | Admitting: Internal Medicine

## 2017-07-03 DIAGNOSIS — I10 Essential (primary) hypertension: Secondary | ICD-10-CM

## 2017-07-03 MED ORDER — SPIRONOLACTONE 25 MG PO TABS: 12.5000 mg | ORAL_TABLET | Freq: Every day | ORAL | 2 refills | Status: DC

## 2017-07-17 ENCOUNTER — Telehealth: Payer: Self-pay

## 2017-07-17 NOTE — Telephone Encounter (Signed)
Copied from CRM [phone]. 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 Please advise  Called and left voicemail for patient informing her that Dr. Shirlee LatchMclean does not have availability but our NP Amil AmenJulia has availability on 07-19-17 and to call the office so that she can be scheduled ok for [facility] to schedule patient

## 2017-07-19 ENCOUNTER — Ambulatory Visit (INDEPENDENT_AMBULATORY_CARE_PROVIDER_SITE_OTHER): Payer: Self-pay | Admitting: Family Medicine

## 2017-07-19 ENCOUNTER — Encounter: Payer: Self-pay | Admitting: Family Medicine

## 2017-07-19 VITALS — BP 144/92 | HR 61 | Temp 98.4°F | Wt 304.0 lb

## 2017-07-19 DIAGNOSIS — R0982 Postnasal drip: Secondary | ICD-10-CM

## 2017-07-19 DIAGNOSIS — R490 Dysphonia: Secondary | ICD-10-CM

## 2017-07-19 DIAGNOSIS — I10 Essential (primary) hypertension: Secondary | ICD-10-CM

## 2017-07-19 MED ORDER — FLUTICASONE PROPIONATE 50 MCG/ACT NA SUSP: 2.0000 | Freq: Every day | NASAL | 6 refills | Status: DC

## 2017-07-19 MED ORDER — PREDNISONE 10 MG PO TABS: ORAL_TABLET | ORAL | 0 refills | Status: DC

## 2017-07-19 NOTE — Progress Notes (Signed)
Patient ID: SYNIYA CUI, female   DOB: Mar 28, 1980, 37 y.o.   MRN: [id number]  PCP: McLean-Scocuzza, Pasty Spillers, MD  Subjective:  Maria Quinn is a 37 y.o. year old very pleasant female patient who presents with symptoms including post nasal drip, rhinitis, and "loss of voice"   Associated cough has been present but is improving. 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 started: 2 weeks ago, symptoms are improving slowly -previous treatments: OTC cough and cold medication and Delsym that provided limited benefit. -sick contacts/travel/risks: denies flu exposure. No known sick contact exposure. 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 -Hx of: seasonal allergies per patient Recently placed on medication for HTN. She monitors her BP sporadically and notes that systolic BP average is 140s and diastolic average is close to high 80s or 90s.  She is adherent with her medications. She denies chest pain, palpitations, SOB, numbness, tingling, weakness, headaches, or edema. No history of asthma/COPD She is a former smoker  ROS-denies fever, SOB, NVD, tooth pain, sore throat, ear pain, difficulty swallowing  Pertinent Past Medical History- HTN  Medications- reviewed  Current Outpatient Medications  Medication Sig Dispense Refill  . amLODipine (NORVASC) 10 MG tablet Take 1 tablet (10 mg total) by mouth daily. 30 tablet 2  . chlorthalidone (HYGROTON) 25 MG tablet Take 1 tablet (25 mg total) by mouth daily. 30 tablet 1  . spironolactone (ALDACTONE) 25 MG tablet Take 0.5 tablets (12.5 mg total) by mouth daily. 30 tablet 2   No current facility-administered medications for this visit.     Objective: BP (!) 150/100 (BP Location: Left Arm, Patient Position: Sitting, Cuff Size: Large)   Pulse 61   Temp 98.4 F (36.9 C) (Oral)   Wt (!) 304 lb (137.9  kg)   LMP 10/19/2010   SpO2 99%   BMI 50.59 kg/m  Retake of BP: 144/92 Gen: NAD, resting comfortably HEENT: Turbinates mildly erythematous, TMs normal bilaterally, oropharynx clear and moist, post nasal drip present, no sinus tenderness CV: RRR no murmurs rubs or gallops Lungs: CTAB no crackles, wheeze, rhonchi Abdomen: soft/nontender/nondistended/normal bowel sounds. No rebound or guarding.  Ext: no edema Skin: warm, dry, no rash Neuro: grossly normal, moves all extremities  Assessment/Plan: 1. Hoarseness of voice Improving; symptom occurred after coughing that has improved. Symptoms are most consistent with resolving viral illness that is improving. 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 Advised patient on supportive measures:  Get rest, drink plenty of fluids, and use tylenol if needed. She will focus on supportive measures and resting her voice on her days off.  Follow up if fever >101, if symptoms worsen or if symptoms are not improved in 3 to 4  days. Patient verbalizes understanding.   - predniSONE (DELTASONE) 10 MG tablet; Take 4 tablets daily for 4 days, 3 tabs daily for 2 days, 2 tabs daily for 2 days, and one tab daily for 2 days.  Dispense: 28 tablet; Refill: 0  2. Post-nasal drip  - fluticasone (FLONASE) 50 MCG/ACT nasal spray; Place 2 sprays into both nostrils daily.  Dispense: 16 g; Refill: 6  3. Elevated blood pressure reading in office with diagnosis of hypertension Retake of BP: 144/92. Advised patient to continue medications as prescribed  by PCP. Further advised her to monitor her BP, document readings, and follow up with PCP as scheduled for evaluation of BP and recent addition of medications to control BP. Provided BP monitoring instructions and parameters. She will report average readings >140/90.   We discussed that we did not find any infection that had higher probability of being bacterial  such as pneumonia or strep throat. We discussed signs that bacterial infection may have developed particularly fever or shortness of breath.    Finally, we reviewed reasons to return to care including if symptoms worsen or persist or new concerns arise- once again particularly shortness of breath or fever.  Maria Catalina, FNP

## 2017-07-19 NOTE — Patient Instructions (Addendum)
Your symptoms today are most likely caused by a viral illness that is improving. Please drink plenty of water enough for your urine to be pale yellow or clear.  Prednisone has been provided for your voice and should be taken with food, flonase for nasal symptoms, and you can try either Allegra, Claritin, or Zyrtec for your symptoms as needed.  Please continue to monitor your blood pressure, document readings, and bring readings with you for your follow up.  Minimal Blood Pressure Goal= AVERAGE < 140/90; Ideal is an AVERAGE < 135/85. This AVERAGE should be calculated from @ least 5-7 BP readings taken @ different times of day on different days of week. You should not respond to isolated BP readings , but rather the AVERAGE for that week .Please bring your blood pressure cuff to office visits to verify that it is reliable.It can also be checked against the blood pressure device at the pharmacy. Finger or wrist cuffs are not dependable; an arm cuff is.    Laryngitis Laryngitis is swelling (inflammation) of your vocal cords. This causes hoarseness, coughing, loss of voice, sore throat, or a dry throat. When your vocal cords are inflamed, your voice sounds different. Laryngitis can be temporary (acute) or long-term (chronic). Most cases of acute laryngitis improve with time. Chronic laryngitis is laryngitis that lasts for more than three weeks. Follow these instructions at home:  Drink enough fluid to keep your pee (urine) clear or pale yellow.  Breathe in moist air. Use a humidifier if you live in a dry climate.  Take medicines only as told by your doctor.  Do not smoke cigarettes or electronic cigarettes. If you need help quitting, ask your doctor.  Talk as little as possible. Also avoid whispering, which can cause vocal strain.  Write instead of talking. Do this until your voice is back to normal. Contact a doctor if:  You have a fever.  Your pain is worse.  You have trouble  swallowing. Get help right away if:  You cough up blood.  You have trouble breathing. This information is not intended to replace advice given to you by your health care provider. Make sure you discuss any questions you have with your health care provider. Document Released: 12/15/2010 Document Revised: 06/03/2015 Document Reviewed: 06/10/2013 Elsevier Interactive Patient Education  [employer]2018 Elsevier Inc.

## 2017-08-13 ENCOUNTER — Ambulatory Visit (INDEPENDENT_AMBULATORY_CARE_PROVIDER_SITE_OTHER): Payer: Self-pay | Admitting: Internal Medicine

## 2017-08-13 ENCOUNTER — Encounter: Payer: Self-pay | Admitting: Internal Medicine

## 2017-08-13 VITALS — BP 142/104 | HR 70 | Temp 98.5°F | Ht 65.0 in | Wt 307.4 lb

## 2017-08-13 DIAGNOSIS — I10 Essential (primary) hypertension: Secondary | ICD-10-CM

## 2017-08-13 DIAGNOSIS — F329 Major depressive disorder, single episode, unspecified: Secondary | ICD-10-CM

## 2017-08-13 DIAGNOSIS — F419 Anxiety disorder, unspecified: Secondary | ICD-10-CM

## 2017-08-13 DIAGNOSIS — F32A Depression, unspecified: Secondary | ICD-10-CM

## 2017-08-13 MED ORDER — CHLORTHALIDONE 25 MG PO TABS: 25.0000 mg | ORAL_TABLET | Freq: Every day | ORAL | 5 refills | Status: DC

## 2017-08-13 MED ORDER — SPIRONOLACTONE 50 MG PO TABS: 50.0000 mg | ORAL_TABLET | Freq: Every day | ORAL | 5 refills | Status: DC

## 2017-08-13 MED ORDER — AMLODIPINE BESYLATE 10 MG PO TABS: 10.0000 mg | ORAL_TABLET | Freq: Every day | ORAL | 5 refills | Status: DC

## 2017-08-13 NOTE — Progress Notes (Signed)
Chief Complaint  Patient presents with  . Follow-up   HTN f/u  1. BP still not controlled reports noncompliance but has been taking meds for the last 1.5 weeks chlorthalidone 25 mg qd, norvasc 10 and spironolactone 25 mg qd she at times feels dizzy with taking BP meds but reports skips meals as well  2. Anxiety/depression seeking therapy now and learning to take better care of herself  Review of Systems  Constitutional: Negative for weight loss.  HENT: Negative for hearing loss.   Respiratory: Negative for shortness of breath.   Cardiovascular: Negative for chest pain.       Resolved CP  Gastrointestinal: Negative for heartburn.  Skin: Negative for rash.  Neurological: Positive for dizziness.  Psychiatric/Behavioral: Positive for depression. The patient is nervous/anxious.    Past Medical History:  Diagnosis Date  . Arthritis   . Chicken pox   . Depression   . Diabetes mellitus   . Gastric bypass for obesity complicating pregnancy, birth, puerperium   . Hypertension   . OSA (obstructive sleep apnea)    Past Surgical History:  Procedure Laterality Date  . ABDOMINAL HYSTERECTOMY     Dr. Lang Snow 2012   . OOPHORECTOMY     37 y.o 1 ovary out per pt, later in life 2nd ovary out   . ROUX-EN-Y GASTRIC BYPASS     2010 losst down to 180s gained back   . TONSILLECTOMY     1987   Family History  Problem Relation Age of Onset  . Depression Mother   . Diabetes Mother   . Hypertension Mother   . Miscarriages / [place] Mother   . Cancer Father        prostate  . Diabetes Father   . Heart disease Father   . Hyperlipidemia Father   . Hypertension Father   . Kidney disease Father   . Stroke Father   . Depression Sister   . Alcohol abuse Brother   . Cancer Brother        lung   . Drug abuse Brother   . Arthritis Maternal Grandmother   . Diabetes Maternal Grandmother   . Hypertension Maternal Grandmother   . Stroke Maternal Grandmother   . Alcohol abuse Maternal  Grandfather   . Stroke Paternal Grandmother   . Cancer Paternal Grandfather        ? type  . Alcohol abuse Brother   . Early death Brother    Social History   Socioeconomic History  . Marital status: Single    Spouse name: Not on file  . Number of children: Not on file  . Years of education: Not on file  . Highest education level: Not on file  Occupational History  . Not on file  Social Needs  . Financial resource strain: Not on file  . Food insecurity:    Worry: Not on file    Inability: Not on file  . Transportation needs:    Medical: Not on file    Non-medical: Not on file  Tobacco Use  . Smoking status: Former Games developer  . Smokeless tobacco: Never Used  Substance and Sexual Activity  . Alcohol use: Yes  . Drug use: No  . Sexual activity: Yes    Comment: men  Lifestyle  . Physical activity:    Days per week: Not on file    Minutes per session: Not on file  . Stress: Not on file  Relationships  . Social connections:    Talks  on phone: Not on file    Gets together: Not on file    Attends religious service: Not on file    Active member of club or organization: Not on file    Attends meetings of clubs or organizations: Not on file    Relationship status: Not on file  . Intimate partner violence:    Fear of current or ex partner: Not on file    Emotionally abused: Not on file    Physically abused: Not on file    Forced sexual activity: Not on file  Other Topics Concern  . 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 amLODipine (NORVASC) 10 MG tablet Take 1 tablet (10 mg total) by mouth daily.  . chlorthalidone (HYGROTON) 25 MG tablet Take 1 tablet (25 mg total) by mouth daily.  . fluticasone (FLONASE) 50 MCG/ACT nasal spray Place 2 sprays into both nostrils daily.  Marland Kitchen spironolactone (ALDACTONE) 25 MG tablet Take 0.5 tablets (12.5 mg total)  by mouth daily.   Allergies  Allergen Reactions  . Penicillins Rash   No results found for this or any previous visit (from the past 2160 hour(s)). Objective  Body mass index is 51.15 kg/m. Wt Readings from Last 3 Encounters:  08/13/17 (!) 307 lb 6.4 oz (139.4 kg)  07/19/17 (!) 304 lb (137.9 kg)  06/12/17 299 lb 3.2 oz (135.7 kg)   Temp Readings from Last 3 Encounters:  08/13/17 98.5 F (36.9 C) (Oral)  07/19/17 98.4 F (36.9 C) (Oral)  06/12/17 98.5 F (36.9 C) (Oral)   BP Readings from Last 3 Encounters:  08/13/17 (!) 142/104  07/19/17 (!) 144/92  06/12/17 (!) 146/100   Pulse Readings from Last 3 Encounters:  08/13/17 70  07/19/17 61  06/12/17 70    Physical Exam  Constitutional: She is oriented to person, place, and time. Vital signs are normal. She appears well-developed and well-nourished. She is cooperative.  HENT:  Head: Normocephalic and atraumatic.  Mouth/Throat: Oropharynx is clear and moist and mucous membranes are normal.  Eyes: Pupils are equal, round, and reactive to light. Conjunctivae are normal.  Cardiovascular: Normal rate, regular rhythm and normal heart sounds.  Pulmonary/Chest: Effort normal and breath sounds normal.  Neurological: She is alert and oriented to person, place, and time. Gait normal.  Skin: Skin is warm, dry and intact.  Psychiatric: She has a normal mood and affect. Her speech is normal and behavior is normal. Judgment and thought content normal. Cognition and memory are normal.  Nursing note and vitals reviewed.   Assessment   1. HTN  2. Anxiety/depression  3. Obesity, morbid BMI 51.51  4. HM Plan  1. Increase spironolactone to 50 mg, cont other meds if having sxs I.e dizziness take norvasc 10 mg qhs  Wanted to check BMET and TSH pt to check on cost as she is self pay  Consider sleep study in future  2. Cont therapy  3. rec exercise and healthy diet choic s 4.  Declines flu shot  Tdap given info  Check hep B statusin  future currently pt w/o insurance  HIV neg 10/22/10 declines STD check Check labs in future labs need to hold for now due to cost B12, iron/TIBC, vit D, hep B, A1C, UA, T4, CBC, CMET pt is self pay and  w/o insurance  Last eye exam 2017  LMP 2012 s/p hysterectomy both ovaries removed previously -CT ab/pelvis 10/19/10 c/w complex cystic mass in uterus with elevated CA 125 -per path report removal uterus, b/l fallopian tubes and cervix neg malignancy or dysplasia 10/2010 Dr. Lang SnowHenly  Former smoker rec take Mvt with iron  Provider: Dr. French Anaracy McLean-Scocuzza-Internal Medicine

## 2017-08-13 NOTE — Patient Instructions (Addendum)
If you are feeling dizzy with the increase take norvasc 10 mg at night and other 2 medications during the day  Stay hydrated and always eat with these medications    Hibiscus tea, blueberries, beets  Fasting 12-18 hrs in a day eating early 8 am and 2 pm Supplement meals protein shake (premier protein shaked) add flaxseed or chia seeds  F/u in 3-4 months, check your blood pressure and write it down for me please    Eating Healthy on a Budget There are many ways to save money at the grocery store and continue to eat healthy. You can be successful if you plan your meals according to your budget, purchase according to your budget and grocery list, and prepare food yourself. How can I buy more food on a limited budget? Plan  Plan meals and snacks according to a grocery list and budget you create.  Look for recipes where you can cook once and make enough food for two meals.  Include meals that will "stretch" more expensive foods such as stews, casseroles, and stir-fry dishes.  Make a grocery list and make sure to bring it with you to the store. If you have a smart phone, you could use your phone to create your shopping list. Purchase  When grocery shopping, buy only the items on your grocery list and go only to the areas of the store that have the items on your list. Prepare  Some meal items can be prepared in advance. Pre-cook on days when you have extra time.  Make extra food (such as by doubling recipes) and freeze the extras in meal-sized containers or in individual portions for fast meals and snacks.  Use leftovers in your meal plan for the week.  Try some meatless meals or try "no cook" meals like salads.  When you come home from the grocery store, wash and prepare your fruits and vegetables so they are ready to use and eat. This will help reduce food waste. How can I buy more food on a limited budget? Try these tips the next time you go shopping:  [place] store brands or generic  brands.  Use coupons only for foods and brands you normally buy. Avoid buying items you wouldn't normally buy simply because they are on sale.  Check online and in newspapers for weekly deals.  Buy healthy items from the bulk bins when available, such as herbs, spices, flours, pastas, nuts, and dried fruit.  Buy fruits and vegetables that are in season. Prices are usually lower on in-season produce.  Compare and contrast different items. You can do this by looking at the unit price on the price tag. Use it to compare different brands and sizes to find out which item is the best deal.  Choose naturally low-cost healthy items, such as carrots, potatoes, apples, bananas, and oranges. Dried or canned beans are a low-cost protein source.  Buy in bulk and freeze extra food. Items you can buy in bulk include meats, fish, poultry, frozen fruits, and frozen vegetables.  Limit the purchase of prepared or "ready-to-eat" foods, such as pre-cut fruits and vegetables and pre-made salads.  If possible, shop around to discover which grocery store offers the best prices. Some stores charge much more than other stores for the same items.  Do not shop when you are hungry. If you shop while hungry, It may be hard to stick to your list and budget.  Stick to your list and resist impulse buys. Treat your list  as your official plan for the week.  Buy a variety of vegetables and fruit by purchasing fresh, frozen, and canned items.  Look beyond eye level. Foods at eye level (adult or child eye level) are more expensive. Look at the top and bottom shelves for deals.  Be efficient with your time when shopping. The more time you spend at the store, the more money you are likely to spend.  Consider other retailers such as dollar stores, larger [employer]oles[employer] stores, local fruit and vegetable stands, and farmers markets.  What are some tips for less expensive food substitutions? When choosing more expensive foods like  meats and dairy, try these tips to save money:  Choose cheaper cuts of meat, such as bone-in chicken thighs and drumsticks instead skinless and boneless chicken. When you are ready to prepare the chicken, you can remove the skin yourself to make it healthier.  Choose lean meats like chicken or [place]. When choosing ground beef, make sure it is lean ground beef (92% lean, 8% fat). If you do buy a fattier ground beef, drain the fat before eating.  Buy dried beans and peas, such as lentils, split peas, or kidney beans.  For seafood, choose canned tuna, salmon, or sardines.  Eggs are a low-cost source of protein.  Buy the larger tubs of yogurt instead of individual-sized containers.  Choose water instead of sodas and other sweetened beverages.  Skip buying chips, cookies, and other "junk food". These items are usually expensive, high in calories, and low in nutritional value.  How can I prepare the foods I buy in the healthiest way? Practice these tips for cooking foods in the healthiest way to reduce excess fat and calorie intake:  Steam, saute, grill, or bake foods instead of frying them.  Make sure half your plate is filled with fruits or vegetables. Choose from fresh, frozen, or canned fruits and vegetables. If eating canned, remember to rinse them before eating. This will remove any excess salt added for packaging.  Trim all fat from meat before cooking. Remove the skin from chicken or [place].  Spoon off fat from meat dishes once they have been chilled in the refrigerator and the fat has hardened on the top.  Use skim milk, low-fat milk, or evaporated skim milk when making cream sauces, soups, or puddings.  Substitute low-fat yogurt, sour cream, or cottage cheese for sour cream and mayonnaise in dips and dressings.  Try lemon juice, herbs, or spices to season food instead of salt, butter, or margarine.  This information is not intended to replace advice given to you by your health  care provider. Make sure you discuss any questions you have with your health care provider. Document Released: 08/29/2013 Document Revised: 07/16/2015 Document Reviewed: 07/29/2013 Elsevier Interactive Patient Education  2018 [employer]  Mediterranean Diet A Mediterranean diet refers to food and lifestyle choices that are based on the traditions of countries located on the [place]. This way of eating has been shown to help prevent certain conditions and improve outcomes for people who have chronic diseases, like kidney disease and heart disease. What are tips for following this plan? Lifestyle  Cook and eat meals together with your family, when possible.  Drink enough fluid to keep your urine clear or pale yellow.  Be physically active every day. This includes: ? Aerobic exercise like running or swimming. ? Leisure activities like gardening, walking, or housework.  Get 7-8 hours of sleep each night.  If recommended by your health  care provider, drink red wine in moderation. This means 1 glass a day for nonpregnant women and 2 glasses a day for men. A glass of wine equals 5 oz (150 mL). Reading food labels  Check the serving size of packaged foods. For foods such as rice and pasta, the serving size refers to the amount of cooked product, not dry.  Check the total fat in packaged foods. Avoid foods that have saturated fat or trans fats.  Check the ingredients list for added sugars, such as corn syrup. Shopping  At the grocery store, buy most of your food from the areas near the walls of the store. This includes: ? Fresh fruits and vegetables (produce). ? Grains, beans, nuts, and seeds. Some of these may be available in unpackaged forms or large amounts (in bulk). ? Fresh seafood. ? Poultry and eggs. ? Low-fat dairy products.  Buy whole ingredients instead of prepackaged foods.  Buy fresh fruits and vegetables in-season from local farmers markets.  Buy frozen fruits  and vegetables in resealable bags.  If you do not have access to quality fresh seafood, buy precooked frozen shrimp or canned fish, such as tuna, salmon, or sardines.  Buy small amounts of raw or cooked vegetables, salads, or olives from the deli or salad bar at your store.  Stock your pantry so you always have certain foods on hand, such as olive oil, canned tuna, canned tomatoes, rice, pasta, and beans. Cooking  Cook foods with extra-virgin olive oil instead of using butter or other vegetable oils.  Have meat as a side dish, and have vegetables or grains as your main dish. This means having meat in small portions or adding small amounts of meat to foods like pasta or stew.  Use beans or vegetables instead of meat in common dishes like chili or lasagna.  Experiment with different cooking methods. Try roasting or broiling vegetables instead of steaming or sauteing them.  Add frozen vegetables to soups, stews, pasta, or rice.  Add nuts or seeds for added healthy fat at each meal. You can add these to yogurt, salads, or vegetable dishes.  Marinate fish or vegetables using olive oil, lemon juice, garlic, and fresh herbs. Meal planning  Plan to eat 1 vegetarian meal one day each week. Try to work up to 2 vegetarian meals, if possible.  Eat seafood 2 or more times a week.  Have healthy snacks readily available, such as: ? Vegetable sticks with hummus. ? [place] yogurt. ? Fruit and nut trail mix.  Eat balanced meals throughout the week. This includes: ? Fruit: 2-3 servings a day ? Vegetables: 4-5 servings a day ? Low-fat dairy: 2 servings a day ? Fish, poultry, or lean meat: 1 serving a day ? Beans and legumes: 2 or more servings a week ? Nuts and seeds: 1-2 servings a day ? Whole grains: 6-8 servings a day ? Extra-virgin olive oil: 3-4 servings a day  Limit red meat and sweets to only a few servings a month What are my food choices?  Mediterranean  diet ? Recommended ? Grains: Whole-grain pasta. Brown rice. Bulgar wheat. Polenta. Couscous. Whole-wheat bread. Orpah Cobb. ? Vegetables: Artichokes. Beets. Broccoli. Cabbage. Carrots. Eggplant. Green beans. Chard. Kale. Spinach. Onions. Leeks. Peas. Squash. Tomatoes. Peppers. Radishes. ? Fruits: Apples. Apricots. Avocado. Berries. Bananas. Cherries. Dates. Figs. Grapes. Lemons. Melon. Oranges. Peaches. Plums. Pomegranate. ? Meats and other protein foods: Beans. Almonds. Sunflower seeds. Pine nuts. Peanuts. Cod. Salmon. Scallops. Shrimp. Tuna. Tilapia. Clams. Oysters. Eggs. ?  Dairy: Low-fat milk. Cheese. Greek yogurt. ? Beverages: Water. Red wine. Herbal tea. ? Fats and oils: Extra virgin olive oil. Avocado oil. Grape seed oil. ? Sweets and desserts: [place] yogurt with honey. Baked apples. Poached pears. Trail mix. ? Seasoning and other foods: Basil. Cilantro. Coriander. Cumin. Mint. Parsley. Sage. Rosemary. Tarragon. Garlic. Oregano. Thyme. Pepper. Balsalmic vinegar. Tahini. Hummus. Tomato sauce. Olives. Mushrooms. ? Limit these ? Grains: Prepackaged pasta or rice dishes. Prepackaged cereal with added sugar. ? Vegetables: Deep fried potatoes (french fries). ? Fruits: Fruit canned in syrup. ? Meats and other protein foods: Beef. Pork. Lamb. Poultry with skin. Hot dogs. Tomasa Blase. ? Dairy: Ice cream. Sour cream. Whole milk. ? Beverages: Juice. Sugar-sweetened soft drinks. Beer. Liquor and spirits. ? Fats and oils: Butter. Canola oil. Vegetable oil. Beef fat (tallow). Lard. ? Sweets and desserts: Cookies. Cakes. Pies. Candy. ? Seasoning and other foods: Mayonnaise. Premade sauces and marinades. ? The items listed may not be a complete list. Talk with your dietitian about what dietary choices are right for you. Summary  The Mediterranean diet includes both food and lifestyle choices.  Eat a variety of fresh fruits and vegetables, beans, nuts, seeds, and whole grains.  Limit the amount of red  meat and sweets that you eat.  Talk with your health care provider about whether it is safe for you to drink red wine in moderation. This means 1 glass a day for nonpregnant women and 2 glasses a day for men. A glass of wine equals 5 oz (150 mL). This information is not intended to replace advice given to you by your health care provider. Make sure you discuss any questions you have with your health care provider. Document Released: 08/19/2015 Document Revised: 09/21/2015 Document Reviewed: 08/19/2015 Elsevier Interactive Patient Education  2018 [employer].    Exercising to [employer] Exercising can help you to lose weight. In order to lose weight through exercise, you need to do vigorous-intensity exercise. You can tell that you are exercising with vigorous intensity if you are breathing very hard and fast and cannot hold a conversation while exercising. Moderate-intensity exercise helps to maintain your current weight. You can tell that you are exercising at a moderate level if you have a higher heart rate and faster breathing, but you are still able to hold a conversation. How often should I exercise? Choose an activity that you enjoy and set realistic goals. Your health care provider can help you to make an activity plan that works for you. Exercise regularly as directed by your health care provider. This may include:  Doing resistance training twice each week, such as: ? Push-ups. ? Sit-ups. ? Lifting weights. ? Using resistance bands.  Doing a given intensity of exercise for a given amount of time. Choose from these options: ? 150 minutes of moderate-intensity exercise every week. ? 75 minutes of vigorous-intensity exercise every week. ? A mix of moderate-intensity and vigorous-intensity exercise every week.  Children, pregnant women, people who are out of shape, people who are overweight, and older adults may need to consult a health care provider for individual recommendations.  If you have any sort of medical condition, be sure to consult your health care provider before starting a new exercise program. What are some activities that can help me to lose weight?  Walking at a rate of at least 4.5 miles an hour.  Jogging or running at a rate of 5 miles per hour.  Biking at a rate of at least  10 miles per hour.  Lap swimming.  Roller-skating or in-line skating.  Cross-country skiing.  Vigorous competitive sports, such as football, basketball, and soccer.  Jumping rope.  Aerobic dancing. How can I be more active in my day-to-day activities?  Use the stairs instead of the elevator.  Take a walk during your lunch break.  If you drive, park your car farther away from work or school.  If you take public transportation, get off one stop early and walk the rest of the way.  Make all of your phone calls while standing up and walking around.  Get up, stretch, and walk around every 30 minutes throughout the day. What guidelines should I follow while exercising?  Do not exercise so much that you hurt yourself, feel dizzy, or get very short of breath.  Consult your health care provider prior to starting a new exercise program.  Wear comfortable clothes and shoes with good support.  Drink plenty of water while you exercise to prevent dehydration or heat stroke. Body water is lost during exercise and must be replaced.  Work out until you breathe faster and your heart beats faster. This information is not intended to replace advice given to you by your health care provider. Make sure you discuss any questions you have with your health care provider. Document Released: 01/28/2010 Document Revised: 06/03/2015 Document Reviewed: 05/29/2013 Elsevier Interactive Patient Education  [employer].

## 2017-08-13 NOTE — Progress Notes (Signed)
Pre visit review using our clinic review tool, if applicable. No additional management support is needed unless otherwise documented below in the visit note. 

## 2017-08-19 ENCOUNTER — Emergency Department (HOSPITAL_BASED_OUTPATIENT_CLINIC_OR_DEPARTMENT_OTHER)
Admission: EM | Admit: 2017-08-19 | Discharge: 2017-08-20 | Disposition: A | Discharge status: Home / Self Care | Payer: Self-pay | Attending: Emergency Medicine | Admitting: Emergency Medicine

## 2017-08-19 ENCOUNTER — Other Ambulatory Visit: Payer: Self-pay

## 2017-08-19 ENCOUNTER — Encounter (HOSPITAL_BASED_OUTPATIENT_CLINIC_OR_DEPARTMENT_OTHER): Payer: Self-pay | Admitting: Emergency Medicine

## 2017-08-19 DIAGNOSIS — Z79899 Other long term (current) drug therapy: Secondary | ICD-10-CM | POA: Insufficient documentation

## 2017-08-19 DIAGNOSIS — M62838 Other muscle spasm: Secondary | ICD-10-CM | POA: Insufficient documentation

## 2017-08-19 DIAGNOSIS — I10 Essential (primary) hypertension: Secondary | ICD-10-CM | POA: Insufficient documentation

## 2017-08-19 DIAGNOSIS — Z87891 Personal history of nicotine dependence: Secondary | ICD-10-CM | POA: Insufficient documentation

## 2017-08-19 DIAGNOSIS — E119 Type 2 diabetes mellitus without complications: Secondary | ICD-10-CM | POA: Insufficient documentation

## 2017-08-19 LAB — URINALYSIS, ROUTINE W REFLEX MICROSCOPIC
Bilirubin Urine: NEGATIVE
GLUCOSE, UA: NEGATIVE mg/dL
HGB URINE DIPSTICK: NEGATIVE
KETONES UR: NEGATIVE mg/dL
Leukocytes, UA: NEGATIVE
Nitrite: NEGATIVE
Protein, ur: NEGATIVE mg/dL
Specific Gravity, Urine: 1.025 (ref 1.005–1.030)
pH: 5.5 (ref 5.0–8.0)

## 2017-08-19 LAB — CBG MONITORING, ED: GLUCOSE-CAPILLARY: 89 mg/dL (ref 70–99)

## 2017-08-19 NOTE — ED Provider Notes (Signed)
[facility] EMERGENCY DEPARTMENT Provider Note  CSN: [id number] Arrival date & time: 08/19/17 2116  Chief Complaint(s) Weakness  HPI Maria BailiffCandi T Quinn is a 37 y.o. female with a history of hypertension and diabetes who presents to the emergency department with migrating pain that began 2 hours ago.  Patient reports that she was singing when she started feeling a spastic pain in her ear and her throat on the right side.  Patient also reported pain in the right side of her face.  States that this lasted for several seconds.  She reports that they have been multiple times.  She also had pains on her left side of her neck and ear, right torso, left torso, left leg.  These all lasted for several seconds and resolve spontaneously.  She denied any associated vision loss, slurred speech, focal weakness or loss of sensation. Patient states that now she feels generally fatigued throughout. She denies any trauma.  Reports that she has had episodes like this on her left side of the neck in the past but has not felt that the rest of her body.  She denies any recent fevers or infections.  Denies any chest pain or shortness of breath.  No apparent alleviating or aggravating factors.  HPI   Past Medical History Past Medical History:  Diagnosis Date  . Arthritis   . Chicken pox   . Depression   . Diabetes mellitus   . Gastric bypass for obesity complicating pregnancy, birth, puerperium   . Hypertension   . OSA (obstructive sleep apnea)    Patient Active Problem List   Diagnosis Date Noted  . Depression 08/13/2017  . Headache 06/12/2017  . Anxiety 05/01/2017  . HTN (hypertension) 04/23/2017  . OSA (obstructive sleep apnea) 04/23/2017  . Obesity, morbid, BMI 50 or higher (HCC) 04/23/2017  . Fatty liver 04/23/2017  . Chronic knee pain 04/23/2017  . Hypertensive urgency 04/23/2017  . Anemia 04/23/2017   Home Medication(s) Prior to Admission medications   Medication Sig Start Date End Date  Taking? Authorizing Provider  amLODipine (NORVASC) 10 MG tablet Take 1 tablet (10 mg total) by mouth daily. 08/13/17   McLean-Scocuzza, Pasty Spillersracy N, MD  chlorthalidone (HYGROTON) 25 MG tablet Take 1 tablet (25 mg total) by mouth daily. 08/13/17   McLean-Scocuzza, Pasty Spillersracy N, MD  fluticasone (FLONASE) 50 MCG/ACT nasal spray Place 2 sprays into both nostrils daily. 07/19/17   Roddie McKordsmeier, Julia, FNP  spironolactone (ALDACTONE) 50 MG tablet Take 1 tablet (50 mg total) by mouth daily. 08/13/17   McLean-Scocuzza, Pasty Spillersracy N, MD                                                                                                                                    Past Surgical History Past Surgical History:  Procedure Laterality Date  . ABDOMINAL HYSTERECTOMY     Dr. Lang SnowHenly 2012   . OOPHORECTOMY     37  y.o 1 ovary out per pt, later in life 2nd ovary out   . ROUX-EN-Y GASTRIC BYPASS     2010 losst down to 180s gained back   . TONSILLECTOMY     1987   Family History Family History  Problem Relation Age of Onset  . Depression Mother   . Diabetes Mother   . Hypertension Mother   . Miscarriages / [place] Mother   . Cancer Father        prostate  . Diabetes Father   . Heart disease Father   . Hyperlipidemia Father   . Hypertension Father   . Kidney disease Father   . Stroke Father   . Depression Sister   . Alcohol abuse Brother   . Cancer Brother        lung   . Drug abuse Brother   . Arthritis Maternal Grandmother   . Diabetes Maternal Grandmother   . Hypertension Maternal Grandmother   . Stroke Maternal Grandmother   . Alcohol abuse Maternal Grandfather   . Stroke Paternal Grandmother   . Cancer Paternal Grandfather        ? type  . Alcohol abuse Brother   . Early death Brother     Social History Social History   Tobacco Use  . Smoking status: Former Games developer  . Smokeless tobacco: Never Used  Substance Use Topics  . Alcohol use: Yes  . Drug use: No   Allergies Penicillins  Review of  Systems Review of Systems All other systems are reviewed and are negative for acute change except as noted in the HPI  Physical Exam Vital Signs  I have reviewed the triage vital signs BP (!) 144/100 (BP Location: Left Arm)   Pulse 92   Temp 98.4 F (36.9 C) (Oral)   Resp 18   Ht 5\' 5"  (1.651 m)   Wt (!) 139.3 kg   LMP 10/19/2010   SpO2 100%   BMI 51.09 kg/m   Physical Exam  Constitutional: She is oriented to person, place, and time. She appears well-developed and well-nourished. No distress.  HENT:  Head: Normocephalic and atraumatic.  Right Ear: Tympanic membrane and external ear normal.  Left Ear: Tympanic membrane and external ear normal.  Nose: Nose normal.  Eyes: Conjunctivae and EOM are normal. No scleral icterus.  Neck: Normal range of motion and phonation normal. Muscular tenderness (to right and left paraspinal musculature) present. No spinous process tenderness present. Normal range of motion present.  Cardiovascular: Normal rate and regular rhythm.  Pulmonary/Chest: Effort normal. No stridor. No respiratory distress.  Abdominal: She exhibits no distension.  Musculoskeletal: Normal range of motion. She exhibits no edema.  Neurological: She is alert and oriented to person, place, and time.  Mental Status:  Alert and oriented to person, place, and time.  Attention and concentration normal.  Speech clear.  Recent memory is intact  Cranial Nerves:  II Visual Fields: Intact to confrontation. Visual fields intact. III, IV, VI: Pupils equal and reactive to light and near. Full eye movement without nystagmus  V Facial Sensation: Normal. No weakness of masticatory muscles  VII: No facial weakness or asymmetry  VIII Auditory Acuity: Grossly normal  IX/X: The uvula is midline; the palate elevates symmetrically  XI: Normal sternocleidomastoid and trapezius strength  XII: The tongue is midline. No atrophy or fasciculations.   Motor System: Muscle Strength: 5/5 and  symmetric in the upper and lower extremities. No pronation or drift.  Muscle Tone: Tone and muscle bulk  are normal in the upper and lower extremities.   Reflexes: DTRs: 1+ and symmetrical in all four extremities. No Clonus Coordination: Intact finger-to-nose, No tremor.  Sensation: Intact to light touch, and pinprick.  Gait: Routine gait normal.   Skin: She is not diaphoretic.  Psychiatric: She has a normal mood and affect. Her behavior is normal.  Vitals reviewed.   ED Results and Treatments Labs (all labs ordered are listed, but only abnormal results are displayed) Labs Reviewed  BASIC METABOLIC PANEL - Abnormal; Notable for the following components:      Result Value   Glucose, Bld 100 (*)    Creatinine, Ser 1.12 (*)    All other components within normal limits  URINALYSIS, ROUTINE W REFLEX MICROSCOPIC  CBG MONITORING, ED                                                                                                                         EKG  EKG Interpretation  Date/Time:  Sunday August 19 2017 21:33:56 EDT Ventricular Rate:  90 PR Interval:  200 QRS Duration: 102 QT Interval:  370 QTC Calculation: 452 R Axis:   0 Text Interpretation:  Normal sinus rhythm Left ventricular hypertrophy with repolarization abnormality Abnormal ECG rate a little faster, but LVH with repolarization abnormality is unchanged since 2013 Confirmed by Pricilla Loveless [phone]) on 08/19/2017 9:59:54 PM      Radiology No results found. Pertinent labs & imaging results that were available during my care of the patient were reviewed by me and considered in my medical decision making (see chart for details).  Medications Ordered in ED Medications - No data to display                                                                                                                                  Procedures Procedures  (including critical care time)  Medical Decision Making / ED Course I have  reviewed the nursing notes for this encounter and the patient's prior records (if available in EHR or on provided paperwork).    Nonfocal exam.  Doubt CVA.  Given patient's hypertensive medication, will obtain metabolic panel to assess for electrolyte abnormalities as the sound like muscle spasms.   Potassium and calcium within normal limits.  No other electrolyte derangements.  The patient appears reasonably screened and/or stabilized for discharge and I doubt any other medical condition or other [facility]  requiring further screening, evaluation, or treatment in the ED at this time prior to discharge.  The patient is safe for discharge with strict return precautions.   Final Clinical Impression(s) / ED Diagnoses Final diagnoses:  None   Disposition: Discharge  Condition: Good  I have discussed the results, Dx and Tx plan with the patient who expressed understanding and agree(s) with the plan. Discharge instructions discussed at great length. The patient was given strict return precautions who verbalized understanding of the instructions. No further questions at time of discharge.    ED Discharge Orders    None       Follow Up: McLean-Scocuzza, Pasty Spillers, MD [address] [place] [address] [phone]  Schedule an appointment as soon as possible for a visit  As needed      This chart was dictated using voice recognition software.  Despite best efforts to proofread,  errors can occur which can change the documentation meaning.   Nira Conn, MD 08/20/17 Rich Fuchs

## 2017-08-19 NOTE — ED Triage Notes (Addendum)
Patient states that she was singing tonight when she states that she started to have pain to her right ear down into her jaw and into her throat and then she felt " my mouth twisted" - the patient states that she no longer has that feeling but now feels weak all over and concerned that she is having a stroke  - the patient reports that she has had intermittent pain to her right ear and jaw for the last 2 weeks. Patient talking with out any slurred speech. Neuro is intact. Patient very anxious and states that she has been outside all night long - patient talking on her phone in no distress when called to triage

## 2017-08-20 LAB — BASIC METABOLIC PANEL
Anion gap: 11 (ref 5–15)
BUN: 20 mg/dL (ref 6–20)
CALCIUM: 9.2 mg/dL (ref 8.9–10.3)
CO2: 28 mmol/L (ref 22–32)
Chloride: 101 mmol/L (ref 98–111)
Creatinine, Ser: 1.12 mg/dL — ABNORMAL HIGH (ref 0.44–1.00)
GFR calc Af Amer: >60 mL/min (ref >60)
GLUCOSE: 100 mg/dL — AB (ref 70–99)
Potassium: 4.4 mmol/L (ref 3.5–5.1)
Sodium: 140 mmol/L (ref 135–145)

## 2017-08-20 NOTE — Discharge Instructions (Addendum)
You may use over-the-counter Motrin (Ibuprofen), Acetaminophen (Tylenol), topical muscle creams such as SalonPas, Icy Hot, Bengay, etc. Please stretch, apply heat, and have massage therapy for additional assistance. ° °

## 2017-11-13 ENCOUNTER — Ambulatory Visit: Payer: Self-pay | Admitting: Internal Medicine

## 2017-11-14 ENCOUNTER — Ambulatory Visit: Payer: Self-pay | Admitting: Internal Medicine

## 2018-01-23 ENCOUNTER — Encounter: Payer: Self-pay | Admitting: Internal Medicine

## 2018-01-23 ENCOUNTER — Ambulatory Visit (INDEPENDENT_AMBULATORY_CARE_PROVIDER_SITE_OTHER): Payer: Self-pay | Admitting: Internal Medicine

## 2018-01-23 VITALS — BP 142/94 | HR 74 | Temp 98.4°F | Ht 65.0 in | Wt 312.2 lb

## 2018-01-23 DIAGNOSIS — I10 Essential (primary) hypertension: Secondary | ICD-10-CM

## 2018-01-23 DIAGNOSIS — Z8639 Personal history of other endocrine, nutritional and metabolic disease: Secondary | ICD-10-CM | POA: Insufficient documentation

## 2018-01-23 DIAGNOSIS — G4733 Obstructive sleep apnea (adult) (pediatric): Secondary | ICD-10-CM

## 2018-01-23 DIAGNOSIS — Z1329 Encounter for screening for other suspected endocrine disorder: Secondary | ICD-10-CM

## 2018-01-23 DIAGNOSIS — R739 Hyperglycemia, unspecified: Secondary | ICD-10-CM

## 2018-01-23 DIAGNOSIS — Z9884 Bariatric surgery status: Secondary | ICD-10-CM

## 2018-01-23 DIAGNOSIS — Z13220 Encounter for screening for lipoid disorders: Secondary | ICD-10-CM

## 2018-01-23 DIAGNOSIS — D649 Anemia, unspecified: Secondary | ICD-10-CM

## 2018-01-23 MED ORDER — CHLORTHALIDONE 25 MG PO TABS: 25.0000 mg | ORAL_TABLET | Freq: Every day | ORAL | 5 refills | Status: DC

## 2018-01-23 MED ORDER — AMLODIPINE BESYLATE 10 MG PO TABS: 10.0000 mg | ORAL_TABLET | Freq: Every day | ORAL | 5 refills | Status: DC

## 2018-01-23 MED ORDER — SPIRONOLACTONE 50 MG PO TABS: 50.0000 mg | ORAL_TABLET | Freq: Every day | ORAL | 5 refills | Status: DC

## 2018-01-23 NOTE — Progress Notes (Signed)
Pre visit review using our clinic review tool, if applicable. No additional management support is needed unless otherwise documented below in the visit note. 

## 2018-01-23 NOTE — Patient Instructions (Addendum)
Ferrous sulfate 325 mg daily in vitamin 1x per day if can tolerate 2x. Vitamin C I.e light orange juice  Vitamin D3 5000 IU daily  Vitamin B12 1000 daily   Beet juice unsweet or Hibiscus tea can help with blood pressure   Tylenol 500 mg up to 6x per day    Hypertension Hypertension, commonly called high blood pressure, is when the force of blood pumping through the arteries is too strong. The arteries are the blood vessels that carry blood from the heart throughout the body. Hypertension forces the heart to work harder to pump blood and may cause arteries to become narrow or stiff. Having untreated or uncontrolled hypertension can cause heart attacks, strokes, kidney disease, and other problems. A blood pressure reading consists of a higher number over a lower number. Ideally, your blood pressure should be below 120/80. The first ("top") number is called the systolic pressure. It is a measure of the pressure in your arteries as your heart beats. The second ("bottom") number is called the diastolic pressure. It is a measure of the pressure in your arteries as the heart relaxes. What are the causes? The cause of this condition is not known. What increases the risk? Some risk factors for high blood pressure are under your control. Others are not. Factors you can change  Smoking.  Having type 2 diabetes mellitus, high cholesterol, or both.  Not getting enough exercise or physical activity.  Being overweight.  Having too much fat, sugar, calories, or salt (sodium) in your diet.  Drinking too much alcohol. Factors that are difficult or impossible to change  Having chronic kidney disease.  Having a family history of high blood pressure.  Age. Risk increases with age.  Race. You may be at higher risk if you are African-American.  Gender. Men are at higher risk than women before age 39. After age 39, women are at higher risk than men.  Having obstructive sleep apnea.  Stress. What  are the signs or symptoms? Extremely high blood pressure (hypertensive crisis) may cause:  Headache.  Anxiety.  Shortness of breath.  Nosebleed.  Nausea and vomiting.  Severe chest pain.  Jerky movements you cannot control (seizures). How is this diagnosed? This condition is diagnosed by measuring your blood pressure while you are seated, with your arm resting on a surface. The cuff of the blood pressure monitor will be placed directly against the skin of your upper arm at the level of your heart. It should be measured at least twice using the same arm. Certain conditions can cause a difference in blood pressure between your right and left arms. Certain factors can cause blood pressure readings to be lower or higher than normal (elevated) for a short period of time:  When your blood pressure is higher when you are in a health care provider's office than when you are at home, this is called white coat hypertension. Most people with this condition do not need medicines.  When your blood pressure is higher at home than when you are in a health care provider's office, this is called masked hypertension. Most people with this condition may need medicines to control blood pressure. If you have a high blood pressure reading during one visit or you have normal blood pressure with other risk factors:  You may be asked to return on a different day to have your blood pressure checked again.  You may be asked to monitor your blood pressure at home for 1 week or  longer. If you are diagnosed with hypertension, you may have other blood or imaging tests to help your health care provider understand your overall risk for other conditions. How is this treated? This condition is treated by making healthy lifestyle changes, such as eating healthy foods, exercising more, and reducing your alcohol intake. Your health care provider may prescribe medicine if lifestyle changes are not enough to get your blood  pressure under control, and if:  Your systolic blood pressure is above 130.  Your diastolic blood pressure is above 80. Your personal target blood pressure may vary depending on your medical conditions, your age, and other factors. Follow these instructions at home: Eating and drinking   Eat a diet that is high in fiber and potassium, and low in sodium, added sugar, and fat. An example eating plan is called the DASH (Dietary Approaches to Stop Hypertension) diet. To eat this way: ? Eat plenty of fresh fruits and vegetables. Try to fill half of your plate at each meal with fruits and vegetables. ? Eat whole grains, such as whole wheat pasta, brown rice, or whole grain bread. Fill about one quarter of your plate with whole grains. ? Eat or drink low-fat dairy products, such as skim milk or low-fat yogurt. ? Avoid fatty cuts of meat, processed or cured meats, and poultry with skin. Fill about one quarter of your plate with lean proteins, such as fish, chicken without skin, beans, eggs, and tofu. ? Avoid premade and processed foods. These tend to be higher in sodium, added sugar, and fat.  Reduce your daily sodium intake. Most people with hypertension should eat less than 1,500 mg of sodium a day.  Limit alcohol intake to no more than 1 drink a day for nonpregnant women and 2 drinks a day for men. One drink equals 12 oz of beer, 5 oz of wine, or 1 oz of hard liquor. Lifestyle   Work with your health care provider to maintain a healthy body weight or to lose weight. Ask what an ideal weight is for you.  Get at least 30 minutes of exercise that causes your heart to beat faster (aerobic exercise) most days of the week. Activities may include walking, swimming, or biking.  Include exercise to strengthen your muscles (resistance exercise), such as pilates or lifting weights, as part of your weekly exercise routine. Try to do these types of exercises for 30 minutes at least 3 days a week.  Do not  use any products that contain nicotine or tobacco, such as cigarettes and e-cigarettes. If you need help quitting, ask your health care provider.  Monitor your blood pressure at home as told by your health care provider.  Keep all follow-up visits as told by your health care provider. This is important. Medicines  Take over-the-counter and prescription medicines only as told by your health care provider. Follow directions carefully. Blood pressure medicines must be taken as prescribed.  Do not skip doses of blood pressure medicine. Doing this puts you at risk for problems and can make the medicine less effective.  Ask your health care provider about side effects or reactions to medicines that you should watch for. Contact a health care provider if:  You think you are having a reaction to a medicine you are taking.  You have headaches that keep coming back (recurring).  You feel dizzy.  You have swelling in your ankles.  You have trouble with your vision. Get help right away if:  You develop a  severe headache or confusion.  You have unusual weakness or numbness.  You feel faint.  You have severe pain in your chest or abdomen.  You vomit repeatedly.  You have trouble breathing. Summary  Hypertension is when the force of blood pumping through your arteries is too strong. If this condition is not controlled, it may put you at risk for serious complications.  Your personal target blood pressure may vary depending on your medical conditions, your age, and other factors. For most people, a normal blood pressure is less than 120/80.  Hypertension is treated with lifestyle changes, medicines, or a combination of both. Lifestyle changes include weight loss, eating a healthy, low-sodium diet, exercising more, and limiting alcohol. This information is not intended to replace advice given to you by your health care provider. Make sure you discuss any questions you have with your health  care provider. Document Released: 12/26/2004 Document Revised: 11/24/2015 Document Reviewed: 11/24/2015 Elsevier Interactive Patient Education  2019 [employer]   DASH Eating Plan DASH stands for "Dietary Approaches to Stop Hypertension." The DASH eating plan is a healthy eating plan that has been shown to reduce high blood pressure (hypertension). It may also reduce your risk for type 2 diabetes, heart disease, and stroke. The DASH eating plan may also help with weight loss. What are tips for following this plan?  General guidelines  Avoid eating more than 2,300 mg (milligrams) of salt (sodium) a day. If you have hypertension, you may need to reduce your sodium intake to 1,500 mg a day.  Limit alcohol intake to no more than 1 drink a day for nonpregnant women and 2 drinks a day for men. One drink equals 12 oz of beer, 5 oz of wine, or 1 oz of hard liquor.  Work with your health care provider to maintain a healthy body weight or to lose weight. Ask what an ideal weight is for you.  Get at least 30 minutes of exercise that causes your heart to beat faster (aerobic exercise) most days of the week. Activities may include walking, swimming, or biking.  Work with your health care provider or diet and nutrition specialist (dietitian) to adjust your eating plan to your individual calorie needs. Reading food labels   Check food labels for the amount of sodium per serving. Choose foods with less than 5 percent of the Daily Value of sodium. Generally, foods with less than 300 mg of sodium per serving fit into this eating plan.  To find whole grains, look for the word "whole" as the first word in the ingredient list. Shopping  Buy products labeled as "low-sodium" or "no salt added."  Buy fresh foods. Avoid canned foods and premade or frozen meals. Cooking  Avoid adding salt when cooking. Use salt-free seasonings or herbs instead of table salt or sea salt. Check with your health care  provider or pharmacist before using salt substitutes.  Do not fry foods. Cook foods using healthy methods such as baking, boiling, grilling, and broiling instead.  Cook with heart-healthy oils, such as olive, canola, soybean, or sunflower oil. Meal planning  Eat a balanced diet that includes: ? 5 or more servings of fruits and vegetables each day. At each meal, try to fill half of your plate with fruits and vegetables. ? Up to 6-8 servings of whole grains each day. ? Less than 6 oz of lean meat, poultry, or fish each day. A 3-oz serving of meat is about the same size as a deck of  cards. One egg equals 1 oz. ? 2 servings of low-fat dairy each day. ? A serving of nuts, seeds, or beans 5 times each week. ? Heart-healthy fats. Healthy fats called Omega-3 fatty acids are found in foods such as flaxseeds and coldwater fish, like sardines, salmon, and mackerel.  Limit how much you eat of the following: ? Canned or prepackaged foods. ? Food that is high in trans fat, such as fried foods. ? Food that is high in saturated fat, such as fatty meat. ? Sweets, desserts, sugary drinks, and other foods with added sugar. ? Full-fat dairy products.  Do not salt foods before eating.  Try to eat at least 2 vegetarian meals each week.  Eat more home-cooked food and less restaurant, buffet, and fast food.  When eating at a restaurant, ask that your food be prepared with less salt or no salt, if possible. What foods are recommended? The items listed may not be a complete list. Talk with your dietitian about what dietary choices are best for you. Grains Whole-grain or whole-wheat bread. Whole-grain or whole-wheat pasta. Brown rice. Orpah Cobb. Bulgur. Whole-grain and low-sodium cereals. Pita bread. Low-fat, low-sodium crackers. Whole-wheat flour tortillas. Vegetables Fresh or frozen vegetables (raw, steamed, roasted, or grilled). Low-sodium or reduced-sodium tomato and vegetable juice. Low-sodium or  reduced-sodium tomato sauce and tomato paste. Low-sodium or reduced-sodium canned vegetables. Fruits All fresh, dried, or frozen fruit. Canned fruit in natural juice (without added sugar). Meat and other protein foods Skinless chicken or [place]. Ground chicken or [place]. Pork with fat trimmed off. Fish and seafood. Egg whites. Dried beans, peas, or lentils. Unsalted nuts, nut butters, and seeds. Unsalted canned beans. Lean cuts of beef with fat trimmed off. Low-sodium, lean deli meat. Dairy Low-fat (1%) or fat-free (skim) milk. Fat-free, low-fat, or reduced-fat cheeses. Nonfat, low-sodium ricotta or cottage cheese. Low-fat or nonfat yogurt. Low-fat, low-sodium cheese. Fats and oils Soft margarine without trans fats. Vegetable oil. Low-fat, reduced-fat, or light mayonnaise and salad dressings (reduced-sodium). Canola, safflower, olive, soybean, and sunflower oils. Avocado. Seasoning and other foods Herbs. Spices. Seasoning mixes without salt. Unsalted popcorn and pretzels. Fat-free sweets. What foods are not recommended? The items listed may not be a complete list. Talk with your dietitian about what dietary choices are best for you. Grains Baked goods made with fat, such as croissants, muffins, or some breads. Dry pasta or rice meal packs. Vegetables Creamed or fried vegetables. Vegetables in a cheese sauce. Regular canned vegetables (not low-sodium or reduced-sodium). Regular canned tomato sauce and paste (not low-sodium or reduced-sodium). Regular tomato and vegetable juice (not low-sodium or reduced-sodium). Rosita Fire. Olives. Fruits Canned fruit in a light or heavy syrup. Fried fruit. Fruit in cream or butter sauce. Meat and other protein foods Fatty cuts of meat. Ribs. Fried meat. Tomasa Blase. Sausage. Bologna and other processed lunch meats. Salami. Fatback. Hotdogs. Bratwurst. Salted nuts and seeds. Canned beans with added salt. Canned or smoked fish. Whole eggs or egg yolks. Chicken or [place]  with skin. Dairy Whole or 2% milk, cream, and half-and-half. Whole or full-fat cream cheese. Whole-fat or sweetened yogurt. Full-fat cheese. Nondairy creamers. Whipped toppings. Processed cheese and cheese spreads. Fats and oils Butter. Stick margarine. Lard. Shortening. Ghee. Bacon fat. Tropical oils, such as coconut, palm kernel, or palm oil. Seasoning and other foods Salted popcorn and pretzels. Onion salt, garlic salt, seasoned salt, table salt, and sea salt. Worcestershire sauce. Tartar sauce. Barbecue sauce. Teriyaki sauce. Soy sauce, including reduced-sodium. Steak sauce. Canned and packaged  gravies. Fish sauce. Oyster sauce. Cocktail sauce. Horseradish that you find on the shelf. Ketchup. Mustard. Meat flavorings and tenderizers. Bouillon cubes. Hot sauce and Tabasco sauce. Premade or packaged marinades. Premade or packaged taco seasonings. Relishes. Regular salad dressings. Where to find more information:  National Heart, Lung, and Blood Institute: PopSteam.is  American Heart Association: www.heart.org Summary  The DASH eating plan is a healthy eating plan that has been shown to reduce high blood pressure (hypertension). It may also reduce your risk for type 2 diabetes, heart disease, and stroke.  With the DASH eating plan, you should limit salt (sodium) intake to 2,300 mg a day. If you have hypertension, you may need to reduce your sodium intake to 1,500 mg a day.  When on the DASH eating plan, aim to eat more fresh fruits and vegetables, whole grains, lean proteins, low-fat dairy, and heart-healthy fats.  Work with your health care provider or diet and nutrition specialist (dietitian) to adjust your eating plan to your individual calorie needs. This information is not intended to replace advice given to you by your health care provider. Make sure you discuss any questions you have with your health care provider. Document Released: 12/15/2010 Document Revised: 12/20/2015  Document Reviewed: 12/20/2015 Elsevier Interactive Patient Education  2019 [employer].  Exercising to [employer] Exercise is structured, repetitive physical activity to improve fitness and health. Getting regular exercise is important for everyone. It is especially important if you are overweight. Being overweight increases your risk of heart disease, stroke, diabetes, high blood pressure, and several types of cancer. Reducing your calorie intake and exercising can help you lose weight. Exercise is usually categorized as moderate or vigorous intensity. To lose weight, most people need to do a certain amount of moderate-intensity or vigorous-intensity exercise each week. Moderate-intensity exercise  Moderate-intensity exercise is any activity that gets you moving enough to burn at least three times more energy (calories) than if you were sitting. Examples of moderate exercise include:  Walking a mile in 15 minutes.  Doing light yard work.  Biking at an easy pace. Most people should get at least 150 minutes (2 hours and 30 minutes) a week of moderate-intensity exercise to maintain their body weight. Vigorous-intensity exercise Vigorous-intensity exercise is any activity that gets you moving enough to burn at least six times more calories than if you were sitting. When you exercise at this intensity, you should be working hard enough that you are not able to carry on a conversation. Examples of vigorous exercise include:  Running.  Playing a team sport, such as football, basketball, and soccer.  Jumping rope. Most people should get at least 75 minutes (1 hour and 15 minutes) a week of vigorous-intensity exercise to maintain their body weight. How can exercise affect me? When you exercise enough to burn more calories than you eat, you lose weight. Exercise also reduces body fat and builds muscle. The more muscle you have, the more calories you burn. Exercise also:  Improves  mood.  Reduces stress and tension.  Improves your overall fitness, flexibility, and endurance.  Increases bone strength. The amount of exercise you need to lose weight depends on:  Your age.  The type of exercise.  Any health conditions you have.  Your overall physical ability. Talk to your health care provider about how much exercise you need and what types of activities are safe for you. What actions can I take to lose weight? Nutrition   Make changes to your diet as  told by your health care provider or diet and nutrition specialist (dietitian). This may include: ? Eating fewer calories. ? Eating more protein. ? Eating less unhealthy fats. ? Eating a diet that includes fresh fruits and vegetables, whole grains, low-fat dairy products, and lean protein. ? Avoiding foods with added fat, salt, and sugar.  Drink plenty of water while you exercise to prevent dehydration or heat stroke. Activity  Choose an activity that you enjoy and set realistic goals. Your health care provider can help you make an exercise plan that works for you.  Exercise at a moderate or vigorous intensity most days of the week. ? The intensity of exercise may vary from person to person. You can tell how intense a workout is for you by paying attention to your breathing and heartbeat. Most people will notice their breathing and heartbeat get faster with more intense exercise.  Do resistance training twice each week, such as: ? Push-ups. ? Sit-ups. ? Lifting weights. ? Using resistance bands.  Getting short amounts of exercise can be just as helpful as long structured periods of exercise. If you have trouble finding time to exercise, try to include exercise in your daily routine. ? Get up, stretch, and walk around every 30 minutes throughout the day. ? Go for a walk during your lunch break. ? Park your car farther away from your destination. ? If you take public transportation, get off one stop early  and walk the rest of the way. ? Make phone calls while standing up and walking around. ? Take the stairs instead of elevators or escalators.  Wear comfortable clothes and shoes with good support.  Do not exercise so much that you hurt yourself, feel dizzy, or get very short of breath. Where to find more information  [place] Department of Health and Human Services: ThisPath.fi  Centers for Disease Control and Prevention (CDC): FootballExhibition.com.br Contact a health care provider:  Before starting a new exercise program.  If you have questions or concerns about your weight.  If you have a medical problem that keeps you from exercising. Get help right away if you have any of the following while exercising:  Injury.  Dizziness.  Difficulty breathing or shortness of breath that does not go away when you stop exercising.  Chest pain.  Rapid heartbeat. Summary  Being overweight increases your risk of heart disease, stroke, diabetes, high blood pressure, and several types of cancer.  Losing weight happens when you burn more calories than you eat.  Reducing the amount of calories you eat in addition to getting regular moderate or vigorous exercise each week helps you lose weight. This information is not intended to replace advice given to you by your health care provider. Make sure you discuss any questions you have with your health care provider. Document Released: 01/28/2010 Document Revised: 01/08/2017 Document Reviewed: 01/08/2017 Elsevier Interactive Patient Education  2019 [employer].

## 2018-01-23 NOTE — Progress Notes (Signed)
Chief Complaint  Patient presents with  . Follow-up   F/u  1. HTN on chlothalidone 25 mg qd, spironolactone 50 mg qd, Norvasc 10 mg qd  2. S/p gastric bypass with h/o vitamin D and iron def  3. OSA but unable to afford sleep study due to no insurance  4. ED visit 08/2017 for muscle cramps abdomen and back when she was performing hot and sweaty labs normal except elevated Cr. She reports Advil prn use for h/a and not drinking enough water   Review of Systems  Constitutional: Negative for weight loss.  HENT: Negative for hearing loss.   Eyes: Negative for blurred vision.  Respiratory: Negative for shortness of breath.   Cardiovascular: Negative for chest pain.  Musculoskeletal: Negative for myalgias.  Skin: Negative for rash.  Neurological: Positive for headaches.  Psychiatric/Behavioral: Negative for depression.   Past Medical History:  Diagnosis Date  . Arthritis   . Chicken pox   . Depression   . Diabetes mellitus   . Gastric bypass for obesity complicating pregnancy, birth, puerperium   . Hypertension   . OSA (obstructive sleep apnea)    Past Surgical History:  Procedure Laterality Date  . ABDOMINAL HYSTERECTOMY     Dr. Lang SnowHenly 2012   . OOPHORECTOMY     38 y.o 1 ovary out per pt, later in life 2nd ovary out   . ROUX-EN-Y GASTRIC BYPASS     2010 losst down to 180s gained back   . TONSILLECTOMY     1987   Family History  Problem Relation Age of Onset  . Depression Mother   . Diabetes Mother   . Hypertension Mother   . Miscarriages / [place] Mother   . Cancer Father        prostate  . Diabetes Father   . Heart disease Father   . Hyperlipidemia Father   . Hypertension Father   . Kidney disease Father   . Stroke Father   . Depression Sister   . Alcohol abuse Brother   . Cancer Brother        lung   . Drug abuse Brother   . Arthritis Maternal Grandmother   . Diabetes Maternal Grandmother   . Hypertension Maternal Grandmother   . Stroke Maternal Grandmother    . Alcohol abuse Maternal Grandfather   . Stroke Paternal Grandmother   . Cancer Paternal Grandfather        ? type  . Alcohol abuse Brother   . Early death Brother    Social History   Socioeconomic History  . Marital status: Single    Spouse name: Not on file  . Number of children: Not on file  . Years of education: Not on file  . Highest education level: Not on file  Occupational History  . Not on file  Social Needs  . Financial resource strain: Not on file  . Food insecurity:    Worry: Not on file    Inability: Not on file  . Transportation needs:    Medical: Not on file    Non-medical: Not on file  Tobacco Use  . Smoking status: Former Games developermoker  . Smokeless tobacco: Never Used  Substance and Sexual Activity  . Alcohol use: Yes  . Drug use: No  . Sexual activity: Yes    Comment: men  Lifestyle  . Physical activity:    Days per week: Not on file    Minutes per session: Not on file  . Stress: Not on  file  Relationships  . Social connections:    Talks on phone: Not on file    Gets together: Not on file    Attends religious service: Not on file    Active member of club or organization: Not on file    Attends meetings of clubs or organizations: Not on file    Relationship status: Not on file  . Intimate partner violence:    Fear of current or ex partner: Not on file    Emotionally abused: Not on file    Physically abused: Not on file    Forced sexual activity: Not on file  Other Topics Concern  . 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 amLODipine (NORVASC) 10 MG tablet Take 1 tablet (10 mg total) by mouth daily.  . chlorthalidone (HYGROTON) 25 MG tablet Take 1 tablet (25 mg total) by mouth daily.  . Coenzyme Q10 (COQ-10) 10 MG CAPS Take by mouth.  . fluticasone (FLONASE) 50 MCG/ACT nasal spray Place 2 sprays into both nostrils  daily.  . Multiple Vitamins-Minerals (CENTRUM WOMEN PO) Take by mouth.  . spironolactone (ALDACTONE) 50 MG tablet Take 1 tablet (50 mg total) by mouth daily.   Allergies  Allergen Reactions  . Penicillins Rash   No results found for this or any previous visit (from the past 2160 hour(s)). Objective  Body mass index is 51.95 kg/m. Wt Readings from Last 3 Encounters:  01/23/18 (!) 312 lb 3.2 oz (141.6 kg)  08/19/17 (!) 307 lb (139.3 kg)  08/13/17 (!) 307 lb 6.4 oz (139.4 kg)   Temp Readings from Last 3 Encounters:  01/23/18 98.4 F (36.9 C) (Oral)  08/19/17 98.4 F (36.9 C) (Oral)  08/13/17 98.5 F (36.9 C) (Oral)   BP Readings from Last 3 Encounters:  01/23/18 (!) 142/94  08/19/17 (!) 144/100  08/13/17 (!) 142/104   Pulse Readings from Last 3 Encounters:  01/23/18 74  08/19/17 92  08/13/17 70    Physical Exam Vitals signs and nursing note reviewed.  Constitutional:      Appearance: Normal appearance. She is well-developed and normal weight.  HENT:     Head: Normocephalic and atraumatic.     Nose: Nose normal.     Mouth/Throat:     Mouth: Mucous membranes are moist.     Pharynx: Oropharynx is clear.  Eyes:     Conjunctiva/sclera: Conjunctivae normal.     Pupils: Pupils are equal, round, and reactive to light.  Cardiovascular:     Rate and Rhythm: Normal rate and regular rhythm.     Heart sounds: Normal heart sounds. No murmur.  Pulmonary:     Effort: Pulmonary effort is normal.     Breath sounds: Normal breath sounds.  Skin:    General: Skin is warm and dry.  Neurological:     General: No focal deficit present.     Mental Status: She is alert and oriented to person, place, and time.     Gait: Gait normal.  Psychiatric:        Attention and Perception: Attention and perception normal.        Mood and Affect: Mood and affect normal.        Speech: Speech normal.        Behavior: Behavior normal. Behavior is cooperative.  Thought Content: Thought  content normal.        Cognition and Memory: Cognition and memory normal.        Judgment: Judgment normal.     Assessment   1. HTN 2. S/p gastric bypass w/ vitamin D and iron def  3. OSA needs sleep study  4. HM Plan  1. Cont meds  Given goodRx coupon  Check fasting labs   2. rec D3 5000 IU qd, B12 1000 daily and ferrous sulfate 325 1-2 pills daily  3. Will order when gets insurance  4.  Declines flu shot  Tdap given info  Check hep B statusin future currently pt w/o insurance  HIV neg 10/22/10 declines STD check Check labs in future labs fasting CMET, CBC, TSH, lipid, vitamin D   Last eye exam 2017  LMP 2012 s/p hysterectomy both ovaries removed previously -CT ab/pelvis 10/19/10 c/w complex cystic mass in uterus with elevated CA 125 -per path report removal uterus, b/l fallopian tubes and cervix neg malignancy or dysplasia 10/2010 Dr. Lang Snow  Former smoker rec take Mvt with iron  Provider: Dr. French Ana McLean-Scocuzza-[facility]

## 2018-01-29 ENCOUNTER — Other Ambulatory Visit (INDEPENDENT_AMBULATORY_CARE_PROVIDER_SITE_OTHER): Payer: Self-pay

## 2018-01-29 DIAGNOSIS — I10 Essential (primary) hypertension: Secondary | ICD-10-CM

## 2018-01-29 DIAGNOSIS — Z1329 Encounter for screening for other suspected endocrine disorder: Secondary | ICD-10-CM

## 2018-01-29 DIAGNOSIS — Z13220 Encounter for screening for lipoid disorders: Secondary | ICD-10-CM

## 2018-01-29 DIAGNOSIS — R739 Hyperglycemia, unspecified: Secondary | ICD-10-CM

## 2018-01-29 DIAGNOSIS — Z8639 Personal history of other endocrine, nutritional and metabolic disease: Secondary | ICD-10-CM

## 2018-01-29 LAB — COMPREHENSIVE METABOLIC PANEL
ALT: 11 U/L (ref 0–35)
AST: 12 U/L (ref 0–37)
Albumin: 3.9 g/dL (ref 3.5–5.2)
Alkaline Phosphatase: 49 U/L (ref 39–117)
BILIRUBIN TOTAL: 0.5 mg/dL (ref 0.2–1.2)
BUN: 18 mg/dL (ref 6–23)
CO2: 31 mEq/L (ref 19–32)
CREATININE: 0.76 mg/dL (ref 0.40–1.20)
Calcium: 8.9 mg/dL (ref 8.4–10.5)
Chloride: 101 mEq/L (ref 96–112)
GFR: 102.49 mL/min (ref >60.00)
GLUCOSE: 116 mg/dL — AB (ref 70–99)
Potassium: 3.7 mEq/L (ref 3.5–5.1)
SODIUM: 139 meq/L (ref 135–145)
TOTAL PROTEIN: 7.3 g/dL (ref 6.0–8.3)

## 2018-01-29 LAB — HEMOGLOBIN A1C: HEMOGLOBIN A1C: 6.4 % (ref 4.6–6.5)

## 2018-01-29 LAB — LIPID PANEL
Cholesterol: 148 mg/dL (ref 0–200)
HDL: 49.5 mg/dL (ref >39.00)
LDL Cholesterol: 77 mg/dL (ref 0–99)
NONHDL: 98.86
Total CHOL/HDL Ratio: 3
Triglycerides: 109 mg/dL (ref 0.0–149.0)
VLDL: 21.8 mg/dL (ref 0.0–40.0)

## 2018-01-29 LAB — CBC WITH DIFFERENTIAL/PLATELET
BASOS ABS: 0 10*3/uL (ref 0.0–0.1)
Basophils Relative: 0.4 % (ref 0.0–3.0)
EOS ABS: 0.1 10*3/uL (ref 0.0–0.7)
Eosinophils Relative: 1.1 % (ref 0.0–5.0)
HCT: 35.8 % — ABNORMAL LOW (ref 36.0–46.0)
Hemoglobin: 11.2 g/dL — ABNORMAL LOW (ref 12.0–15.0)
LYMPHS ABS: 1.8 10*3/uL (ref 0.7–4.0)
Lymphocytes Relative: 34.4 % (ref 12.0–46.0)
MCHC: 31.3 g/dL (ref 30.0–36.0)
MCV: 70 fl — AB (ref 78.0–100.0)
MONO ABS: 0.7 10*3/uL (ref 0.1–1.0)
MONOS PCT: 14.6 % — AB (ref 3.0–12.0)
NEUTROS PCT: 49.5 % (ref 43.0–77.0)
Neutro Abs: 2.5 10*3/uL (ref 1.4–7.7)
Platelets: 220 10*3/uL (ref 150.0–400.0)
RBC: 5.12 Mil/uL — AB (ref 3.87–5.11)
RDW: 19.2 % — ABNORMAL HIGH (ref 11.5–15.5)
WBC: 5.1 10*3/uL (ref 4.0–10.5)

## 2018-01-29 LAB — TSH: TSH: 1.33 u[IU]/mL (ref 0.35–4.50)

## 2018-02-21 ENCOUNTER — Other Ambulatory Visit: Payer: Self-pay

## 2018-02-21 ENCOUNTER — Ambulatory Visit (INDEPENDENT_AMBULATORY_CARE_PROVIDER_SITE_OTHER): Payer: Self-pay | Admitting: Internal Medicine

## 2018-02-21 ENCOUNTER — Encounter: Payer: Self-pay | Admitting: Internal Medicine

## 2018-02-21 VITALS — BP 144/102 | HR 72 | Temp 98.2°F | Ht 65.0 in | Wt 315.8 lb

## 2018-02-21 DIAGNOSIS — I10 Essential (primary) hypertension: Secondary | ICD-10-CM

## 2018-02-21 MED ORDER — LOSARTAN POTASSIUM 50 MG PO TABS: 50.0000 mg | ORAL_TABLET | Freq: Every day | ORAL | 5 refills | Status: DC

## 2018-02-21 NOTE — Progress Notes (Signed)
Chief Complaint  Patient presents with  . Follow-up    blood pressure   F/u  1. HTN uncontrolled on norvasc 10 mg chlorthalidone 25 mg and spironolactone 50 mg qd she is tearful as she has changed diet but not losing nor BP controlled reviewed labs also prediabetic    Review of Systems  Constitutional: Negative for weight loss.  HENT: Negative for hearing loss.   Eyes: Negative for blurred vision.  Respiratory: Negative for shortness of breath.   Cardiovascular: Negative for chest pain.  Skin: Negative for rash.  Neurological: Positive for headaches.  Psychiatric/Behavioral: Negative for depression. The patient is not nervous/anxious.    Past Medical History:  Diagnosis Date  . Arthritis   . Chicken pox   . Depression   . Diabetes mellitus   . Gastric bypass for obesity complicating pregnancy, birth, puerperium   . Hypertension   . OSA (obstructive sleep apnea)    Past Surgical History:  Procedure Laterality Date  . ABDOMINAL HYSTERECTOMY     Dr. Lang Snow 2012   . OOPHORECTOMY     38 y.o 1 ovary out per pt, later in life 2nd ovary out   . ROUX-EN-Y GASTRIC BYPASS     2010 losst down to 180s gained back   . TONSILLECTOMY     1987   Family History  Problem Relation Age of Onset  . Depression Mother   . Diabetes Mother   . Hypertension Mother   . Miscarriages / [place] Mother   . Cancer Father        prostate  . Diabetes Father   . Heart disease Father   . Hyperlipidemia Father   . Hypertension Father   . Kidney disease Father   . Stroke Father   . Depression Sister   . Alcohol abuse Brother   . Cancer Brother        lung   . Drug abuse Brother   . Arthritis Maternal Grandmother   . Diabetes Maternal Grandmother   . Hypertension Maternal Grandmother   . Stroke Maternal Grandmother   . Alcohol abuse Maternal Grandfather   . Stroke Paternal Grandmother   . Cancer Paternal Grandfather        ? type  . Alcohol abuse Brother   . Early death Brother     Social History   Socioeconomic History  . Marital status: Single    Spouse name: Not on file  . Number of children: Not on file  . Years of education: Not on file  . Highest education level: Not on file  Occupational History  . Not on file  Social Needs  . Financial resource strain: Not on file  . Food insecurity:    Worry: Not on file    Inability: Not on file  . Transportation needs:    Medical: Not on file    Non-medical: Not on file  Tobacco Use  . Smoking status: Former Games developer  . Smokeless tobacco: Never Used  Substance and Sexual Activity  . Alcohol use: Yes  . Drug use: No  . Sexual activity: Yes    Comment: men  Lifestyle  . Physical activity:    Days per week: Not on file    Minutes per session: Not on file  . Stress: Not on file  Relationships  . Social connections:    Talks on phone: Not on file    Gets together: Not on file    Attends religious service: Not on file  Active member of club or organization: Not on file    Attends meetings of clubs or organizations: Not on file    Relationship status: Not on file  . Intimate partner violence:    Fear of current or ex partner: Not on file    Emotionally abused: Not on file    Physically abused: Not on file    Forced sexual activity: Not on file  Other Topics Concern  . 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 amLODipine (NORVASC) 10 MG tablet Take 1 tablet (10 mg total) by mouth daily.  . chlorthalidone (HYGROTON) 25 MG tablet Take 1 tablet (25 mg total) by mouth daily.  . Coenzyme Q10 (COQ-10) 10 MG CAPS Take by mouth.  . fluticasone (FLONASE) 50 MCG/ACT nasal spray Place 2 sprays into both nostrils daily.  . Multiple Vitamins-Minerals (CENTRUM WOMEN PO) Take by mouth.  . spironolactone (ALDACTONE) 50 MG tablet Take 1 tablet (50 mg total) by mouth daily.   Allergies   Allergen Reactions  . Penicillins Rash   Recent Results (from the past 2160 hour(s))  Hemoglobin A1c     Status: None   Collection Time: 01/29/18  8:13 AM  Result Value Ref Range   Hgb A1c MFr Bld 6.4 4.6 - 6.5 %    Comment: Glycemic Control Guidelines for People with Diabetes:Non Diabetic:  <6%Goal of Therapy: <7%Additional Action Suggested:  >8%   Lipid panel     Status: None   Collection Time: 01/29/18  8:13 AM  Result Value Ref Range   Cholesterol 148 0 - 200 mg/dL    Comment: ATP III Classification       Desirable:  < 200 mg/dL               Borderline High:  200 - 239 mg/dL          High:  > = 409240 mg/dL   Triglycerides 811.9109.0 0.0 - 149.0 mg/dL    Comment: Normal:  <147<150 mg/dLBorderline High:  150 - 199 mg/dL   HDL 82.9549.50 >62.13>39.00 mg/dL   VLDL 08.621.8 0.0 - 57.840.0 mg/dL   LDL Cholesterol 77 0 - 99 mg/dL   Total CHOL/HDL Ratio 3     Comment:                Men          Women1/2 Average Risk     3.4          3.3Average Risk          5.0          4.42X Average Risk          9.6          7.13X Average Risk          15.0          11.0                       NonHDL 98.86     Comment: NOTE:  Non-HDL goal should be 30 mg/dL higher than patient's LDL goal (i.e. LDL goal of < 70 mg/dL, would have non-HDL goal of < 100 mg/dL)  TSH     Status: None   Collection Time: 01/29/18  8:13 AM  Result Value  Ref Range   TSH 1.33 0.35 - 4.50 uIU/mL  CBC with Differential/Platelet     Status: Abnormal   Collection Time: 01/29/18  8:13 AM  Result Value Ref Range   WBC 5.1 4.0 - 10.5 K/uL   RBC 5.12 (H) 3.87 - 5.11 Mil/uL   Hemoglobin 11.2 (L) 12.0 - 15.0 g/dL   HCT 16.1 (L) 09.6 - 04.5 %   MCV 70.0 (L) 78.0 - 100.0 fl   MCHC 31.3 30.0 - 36.0 g/dL   RDW 40.9 (H) 81.1 - 91.4 %   Platelets 220.0 150.0 - 400.0 K/uL   Neutrophils Relative % 49.5 43.0 - 77.0 %   Lymphocytes Relative 34.4 12.0 - 46.0 %   Monocytes Relative 14.6 (H) 3.0 - 12.0 %   Eosinophils Relative 1.1 0.0 - 5.0 %   Basophils Relative 0.4  0.0 - 3.0 %   Neutro Abs 2.5 1.4 - 7.7 K/uL   Lymphs Abs 1.8 0.7 - 4.0 K/uL   Monocytes Absolute 0.7 0.1 - 1.0 K/uL   Eosinophils Absolute 0.1 0.0 - 0.7 K/uL   Basophils Absolute 0.0 0.0 - 0.1 K/uL  Comprehensive metabolic panel     Status: Abnormal   Collection Time: 01/29/18  8:13 AM  Result Value Ref Range   Sodium 139 135 - 145 mEq/L   Potassium 3.7 3.5 - 5.1 mEq/L   Chloride 101 96 - 112 mEq/L   CO2 31 19 - 32 mEq/L   Glucose, Bld 116 (H) 70 - 99 mg/dL   BUN 18 6 - 23 mg/dL   Creatinine, Ser 7.82 0.40 - 1.20 mg/dL   Total Bilirubin 0.5 0.2 - 1.2 mg/dL   Alkaline Phosphatase 49 39 - 117 U/L   AST 12 0 - 37 U/L   ALT 11 0 - 35 U/L   Total Protein 7.3 6.0 - 8.3 g/dL   Albumin 3.9 3.5 - 5.2 g/dL   Calcium 8.9 8.4 - 95.6 mg/dL   GFR 213.08 >65.78 mL/min   Objective  Body mass index is 52.55 kg/m. Wt Readings from Last 3 Encounters:  02/21/18 (!) 315 lb 12.8 oz (143.2 kg)  01/23/18 (!) 312 lb 3.2 oz (141.6 kg)  08/19/17 (!) 307 lb (139.3 kg)   Temp Readings from Last 3 Encounters:  02/21/18 98.2 F (36.8 C)  01/23/18 98.4 F (36.9 C) (Oral)  08/19/17 98.4 F (36.9 C) (Oral)   BP Readings from Last 3 Encounters:  02/21/18 (!) 148/98  01/23/18 (!) 142/94  08/19/17 (!) 144/100   Pulse Readings from Last 3 Encounters:  02/21/18 72  01/23/18 74  08/19/17 92    Physical Exam Vitals signs and nursing note reviewed.  Constitutional:      Appearance: Normal appearance. She is well-developed and well-groomed.  HENT:     Head: Normocephalic and atraumatic.     Nose: Nose normal.     Mouth/Throat:     Mouth: Mucous membranes are moist.     Pharynx: Oropharynx is clear.  Eyes:     Conjunctiva/sclera: Conjunctivae normal.     Pupils: Pupils are equal, round, and reactive to light.  Cardiovascular:     Rate and Rhythm: Normal rate and regular rhythm.     Heart sounds: Normal heart sounds. No murmur.  Pulmonary:     Effort: Pulmonary effort is normal.     Breath  sounds: Normal breath sounds.  Neurological:     General: No focal deficit present.     Mental Status: She is  alert and oriented to person, place, and time. Mental status is at baseline.     Gait: Gait normal.  Psychiatric:        Attention and Perception: Attention and perception normal.        Mood and Affect: Mood and affect normal.        Speech: Speech normal.        Behavior: Behavior normal. Behavior is cooperative.        Thought Content: Thought content normal.        Cognition and Memory: Cognition normal.        Judgment: Judgment normal.     Comments: Tearful on exam       Assessment   1. HTN uncontrolled  2. HM Plan   1. Cont meds add losartan 50 mg qhs  2.  02/21/2018 PHQ9 socre 4 and GAD 7 score 3  Declines flu shot  Tdap given info no insurance currently  Check hep B statusin future currently pt w/o insurance  HIV neg 10/22/10 declines STD check   Last eye exam 2017  LMP 2012 s/p hysterectomy both ovaries removed previously -CT ab/pelvis 10/19/10 c/w complex cystic mass in uterus with elevated CA 125 -per path report removal uterus, b/l fallopian tubes and cervix neg malignancy or dysplasia 10/2010 Dr. Lang SnowHenly  Former smoker rec take Mvt with iron Provider: Dr. French Anaracy McLean-Scocuzza-[facility]

## 2018-02-21 NOTE — Patient Instructions (Addendum)
Ferrous sulfate  Multivitamin with iron name above     Hypertension Hypertension, commonly called high blood pressure, is when the force of blood pumping through the arteries is too strong. The arteries are the blood vessels that carry blood from the heart throughout the body. Hypertension forces the heart to work harder to pump blood and may cause arteries to become narrow or stiff. Having untreated or uncontrolled hypertension can cause heart attacks, strokes, kidney disease, and other problems. A blood pressure reading consists of a higher number over a lower number. Ideally, your blood pressure should be below 120/80. The first ("top") number is called the systolic pressure. It is a measure of the pressure in your arteries as your heart beats. The second ("bottom") number is called the diastolic pressure. It is a measure of the pressure in your arteries as the heart relaxes. What are the causes? The cause of this condition is not known. What increases the risk? Some risk factors for high blood pressure are under your control. Others are not. Factors you can change  Smoking.  Having type 2 diabetes mellitus, high cholesterol, or both.  Not getting enough exercise or physical activity.  Being overweight.  Having too much fat, sugar, calories, or salt (sodium) in your diet.  Drinking too much alcohol. Factors that are difficult or impossible to change  Having chronic kidney disease.  Having a family history of high blood pressure.  Age. Risk increases with age.  Race. You may be at higher risk if you are African-American.  Gender. Men are at higher risk than women before age 65. After age 38, women are at higher risk than men.  Having obstructive sleep apnea.  Stress. What are the signs or symptoms? Extremely high blood pressure (hypertensive crisis) may cause:  Headache.  Anxiety.  Shortness of breath.  Nosebleed.  Nausea and vomiting.  Severe chest  pain.  Jerky movements you cannot control (seizures). How is this diagnosed? This condition is diagnosed by measuring your blood pressure while you are seated, with your arm resting on a surface. The cuff of the blood pressure monitor will be placed directly against the skin of your upper arm at the level of your heart. It should be measured at least twice using the same arm. Certain conditions can cause a difference in blood pressure between your right and left arms. Certain factors can cause blood pressure readings to be lower or higher than normal (elevated) for a short period of time:  When your blood pressure is higher when you are in a health care provider's office than when you are at home, this is called white coat hypertension. Most people with this condition do not need medicines.  When your blood pressure is higher at home than when you are in a health care provider's office, this is called masked hypertension. Most people with this condition may need medicines to control blood pressure. If you have a high blood pressure reading during one visit or you have normal blood pressure with other risk factors:  You may be asked to return on a different day to have your blood pressure checked again.  You may be asked to monitor your blood pressure at home for 1 week or longer. If you are diagnosed with hypertension, you may have other blood or imaging tests to help your health care provider understand your overall risk for other conditions. How is this treated? This condition is treated by making healthy lifestyle changes, such as eating healthy  foods, exercising more, and reducing your alcohol intake. Your health care provider may prescribe medicine if lifestyle changes are not enough to get your blood pressure under control, and if:  Your systolic blood pressure is above 130.  Your diastolic blood pressure is above 80. Your personal target blood pressure may vary depending on your medical  conditions, your age, and other factors. Follow these instructions at home: Eating and drinking   Eat a diet that is high in fiber and potassium, and low in sodium, added sugar, and fat. An example eating plan is called the DASH (Dietary Approaches to Stop Hypertension) diet. To eat this way: ? Eat plenty of fresh fruits and vegetables. Try to fill half of your plate at each meal with fruits and vegetables. ? Eat whole grains, such as whole wheat pasta, brown rice, or whole grain bread. Fill about one quarter of your plate with whole grains. ? Eat or drink low-fat dairy products, such as skim milk or low-fat yogurt. ? Avoid fatty cuts of meat, processed or cured meats, and poultry with skin. Fill about one quarter of your plate with lean proteins, such as fish, chicken without skin, beans, eggs, and tofu. ? Avoid premade and processed foods. These tend to be higher in sodium, added sugar, and fat.  Reduce your daily sodium intake. Most people with hypertension should eat less than 1,500 mg of sodium a day.  Limit alcohol intake to no more than 1 drink a day for nonpregnant women and 2 drinks a day for men. One drink equals 12 oz of beer, 5 oz of wine, or 1 oz of hard liquor. Lifestyle   Work with your health care provider to maintain a healthy body weight or to lose weight. Ask what an ideal weight is for you.  Get at least 30 minutes of exercise that causes your heart to beat faster (aerobic exercise) most days of the week. Activities may include walking, swimming, or biking.  Include exercise to strengthen your muscles (resistance exercise), such as pilates or lifting weights, as part of your weekly exercise routine. Try to do these types of exercises for 30 minutes at least 3 days a week.  Do not use any products that contain nicotine or tobacco, such as cigarettes and e-cigarettes. If you need help quitting, ask your health care provider.  Monitor your blood pressure at home as told by  your health care provider.  Keep all follow-up visits as told by your health care provider. This is important. Medicines  Take over-the-counter and prescription medicines only as told by your health care provider. Follow directions carefully. Blood pressure medicines must be taken as prescribed.  Do not skip doses of blood pressure medicine. Doing this puts you at risk for problems and can make the medicine less effective.  Ask your health care provider about side effects or reactions to medicines that you should watch for. Contact a health care provider if:  You think you are having a reaction to a medicine you are taking.  You have headaches that keep coming back (recurring).  You feel dizzy.  You have swelling in your ankles.  You have trouble with your vision. Get help right away if:  You develop a severe headache or confusion.  You have unusual weakness or numbness.  You feel faint.  You have severe pain in your chest or abdomen.  You vomit repeatedly.  You have trouble breathing. Summary  Hypertension is when the force of blood pumping through  your arteries is too strong. If this condition is not controlled, it may put you at risk for serious complications.  Your personal target blood pressure may vary depending on your medical conditions, your age, and other factors. For most people, a normal blood pressure is less than 120/80.  Hypertension is treated with lifestyle changes, medicines, or a combination of both. Lifestyle changes include weight loss, eating a healthy, low-sodium diet, exercising more, and limiting alcohol. This information is not intended to replace advice given to you by your health care provider. Make sure you discuss any questions you have with your health care provider. Document Released: 12/26/2004 Document Revised: 11/24/2015 Document Reviewed: 11/24/2015 Elsevier Interactive Patient Education  2019 [employer]  DASH Eating Plan DASH  stands for "Dietary Approaches to Stop Hypertension." The DASH eating plan is a healthy eating plan that has been shown to reduce high blood pressure (hypertension). It may also reduce your risk for type 2 diabetes, heart disease, and stroke. The DASH eating plan may also help with weight loss. What are tips for following this plan?  General guidelines  Avoid eating more than 2,300 mg (milligrams) of salt (sodium) a day. If you have hypertension, you may need to reduce your sodium intake to 1,500 mg a day.  Limit alcohol intake to no more than 1 drink a day for nonpregnant women and 2 drinks a day for men. One drink equals 12 oz of beer, 5 oz of wine, or 1 oz of hard liquor.  Work with your health care provider to maintain a healthy body weight or to lose weight. Ask what an ideal weight is for you.  Get at least 30 minutes of exercise that causes your heart to beat faster (aerobic exercise) most days of the week. Activities may include walking, swimming, or biking.  Work with your health care provider or diet and nutrition specialist (dietitian) to adjust your eating plan to your individual calorie needs. Reading food labels   Check food labels for the amount of sodium per serving. Choose foods with less than 5 percent of the Daily Value of sodium. Generally, foods with less than 300 mg of sodium per serving fit into this eating plan.  To find whole grains, look for the word "whole" as the first word in the ingredient list. Shopping  Buy products labeled as "low-sodium" or "no salt added."  Buy fresh foods. Avoid canned foods and premade or frozen meals. Cooking  Avoid adding salt when cooking. Use salt-free seasonings or herbs instead of table salt or sea salt. Check with your health care provider or pharmacist before using salt substitutes.  Do not fry foods. Cook foods using healthy methods such as baking, boiling, grilling, and broiling instead.  Cook with heart-healthy oils,  such as olive, canola, soybean, or sunflower oil. Meal planning  Eat a balanced diet that includes: ? 5 or more servings of fruits and vegetables each day. At each meal, try to fill half of your plate with fruits and vegetables. ? Up to 6-8 servings of whole grains each day. ? Less than 6 oz of lean meat, poultry, or fish each day. A 3-oz serving of meat is about the same size as a deck of cards. One egg equals 1 oz. ? 2 servings of low-fat dairy each day. ? A serving of nuts, seeds, or beans 5 times each week. ? Heart-healthy fats. Healthy fats called Omega-3 fatty acids are found in foods such as flaxseeds and coldwater fish, like  sardines, salmon, and mackerel.  Limit how much you eat of the following: ? Canned or prepackaged foods. ? Food that is high in trans fat, such as fried foods. ? Food that is high in saturated fat, such as fatty meat. ? Sweets, desserts, sugary drinks, and other foods with added sugar. ? Full-fat dairy products.  Do not salt foods before eating.  Try to eat at least 2 vegetarian meals each week.  Eat more home-cooked food and less restaurant, buffet, and fast food.  When eating at a restaurant, ask that your food be prepared with less salt or no salt, if possible. What foods are recommended? The items listed may not be a complete list. Talk with your dietitian about what dietary choices are best for you. Grains Whole-grain or whole-wheat bread. Whole-grain or whole-wheat pasta. Brown rice. Orpah Cobbatmeal. Quinoa. Bulgur. Whole-grain and low-sodium cereals. Pita bread. Low-fat, low-sodium crackers. Whole-wheat flour tortillas. Vegetables Fresh or frozen vegetables (raw, steamed, roasted, or grilled). Low-sodium or reduced-sodium tomato and vegetable juice. Low-sodium or reduced-sodium tomato sauce and tomato paste. Low-sodium or reduced-sodium canned vegetables. Fruits All fresh, dried, or frozen fruit. Canned fruit in natural juice (without added sugar). Meat  and other protein foods Skinless chicken or Malawiturkey. Ground chicken or [place]urkey. Pork with fat trimmed off. Fish and seafood. Egg whites. Dried beans, peas, or lentils. Unsalted nuts, nut butters, and seeds. Unsalted canned beans. Lean cuts of beef with fat trimmed off. Low-sodium, lean deli meat. Dairy Low-fat (1%) or fat-free (skim) milk. Fat-free, low-fat, or reduced-fat cheeses. Nonfat, low-sodium ricotta or cottage cheese. Low-fat or nonfat yogurt. Low-fat, low-sodium cheese. Fats and oils Soft margarine without trans fats. Vegetable oil. Low-fat, reduced-fat, or light mayonnaise and salad dressings (reduced-sodium). Canola, safflower, olive, soybean, and sunflower oils. Avocado. Seasoning and other foods Herbs. Spices. Seasoning mixes without salt. Unsalted popcorn and pretzels. Fat-free sweets. What foods are not recommended? The items listed may not be a complete list. Talk with your dietitian about what dietary choices are best for you. Grains Baked goods made with fat, such as croissants, muffins, or some breads. Dry pasta or rice meal packs. Vegetables Creamed or fried vegetables. Vegetables in a cheese sauce. Regular canned vegetables (not low-sodium or reduced-sodium). Regular canned tomato sauce and paste (not low-sodium or reduced-sodium). Regular tomato and vegetable juice (not low-sodium or reduced-sodium). Rosita FirePickles. Olives. Fruits Canned fruit in a light or heavy syrup. Fried fruit. Fruit in cream or butter sauce. Meat and other protein foods Fatty cuts of meat. Ribs. Fried meat. Tomasa BlaseBacon. Sausage. Bologna and other processed lunch meats. Salami. Fatback. Hotdogs. Bratwurst. Salted nuts and seeds. Canned beans with added salt. Canned or smoked fish. Whole eggs or egg yolks. Chicken or Malaw[place]urkey with skin. Dairy Whole or 2% milk, cream, and half-and-half. Whole or full-fat cream cheese. Whole-fat or sweetened yogurt. Full-fat cheese. Nondairy creamers. Whipped toppings. Processed cheese and  cheese spreads. Fats and oils Butter. Stick margarine. Lard. Shortening. Ghee. Bacon fat. Tropical oils, such as coconut, palm kernel, or palm oil. Seasoning and other foods Salted popcorn and pretzels. Onion salt, garlic salt, seasoned salt, table salt, and sea salt. Worcestershire sauce. Tartar sauce. Barbecue sauce. Teriyaki sauce. Soy sauce, including reduced-sodium. Steak sauce. Canned and packaged gravies. Fish sauce. Oyster sauce. Cocktail sauce. Horseradish that you find on the shelf. Ketchup. Mustard. Meat flavorings and tenderizers. Bouillon cubes. Hot sauce and Tabasco sauce. Premade or packaged marinades. Premade or packaged taco seasonings. Relishes. Regular salad dressings. Where to find more information:  National Heart, Lung, and Blood Institute: PopSteam.is  American Heart Association: www.heart.org Summary  The DASH eating plan is a healthy eating plan that has been shown to reduce high blood pressure (hypertension). It may also reduce your risk for type 2 diabetes, heart disease, and stroke.  With the DASH eating plan, you should limit salt (sodium) intake to 2,300 mg a day. If you have hypertension, you may need to reduce your sodium intake to 1,500 mg a day.  When on the DASH eating plan, aim to eat more fresh fruits and vegetables, whole grains, lean proteins, low-fat dairy, and heart-healthy fats.  Work with your health care provider or diet and nutrition specialist (dietitian) to adjust your eating plan to your individual calorie needs. This information is not intended to replace advice given to you by your health care provider. Make sure you discuss any questions you have with your health care provider. Document Released: 12/15/2010 Document Revised: 12/20/2015 Document Reviewed: 12/20/2015 Elsevier Interactive Patient Education  2019 [employer].

## 2018-03-06 ENCOUNTER — Other Ambulatory Visit: Payer: Self-pay | Admitting: Internal Medicine

## 2018-03-06 DIAGNOSIS — I10 Essential (primary) hypertension: Secondary | ICD-10-CM

## 2018-03-06 MED ORDER — LOSARTAN POTASSIUM 50 MG PO TABS: 50.0000 mg | ORAL_TABLET | Freq: Every day | ORAL | 1 refills | Status: DC

## 2018-03-06 NOTE — Telephone Encounter (Signed)
Copied from CRM [id number]. Topic: Quick Communication - Rx Refill/Question >> Mar 06, 2018 10:46 AM Floria Raveling A wrote: Medication: losartan (COZAAR) 50 MG tablet [[id number]]- pt called and stated that she lost the written script that she was given on the 13th for this med.  She would like to know if this can be called in?   Has the patient contacted their pharmacy? No. (Agent: If no, request that the patient contact the pharmacy for the refill.) (Agent: If yes, when and what did the pharmacy advise?)  Preferred Pharmacy (with phone number or street name): [facility] [address], [place] - [address] [phone] (Phone)   Agent: Please be advised that RX refills may take up to 3 business days. We ask that you follow-up with your pharmacy.

## 2018-03-12 ENCOUNTER — Ambulatory Visit: Payer: Self-pay | Admitting: Nurse Practitioner

## 2018-03-12 ENCOUNTER — Ambulatory Visit: Payer: Self-pay

## 2018-03-12 ENCOUNTER — Ambulatory Visit: Payer: Self-pay | Admitting: *Deleted

## 2018-03-12 NOTE — Telephone Encounter (Signed)
FYI

## 2018-03-12 NOTE — Telephone Encounter (Signed)
Incoming  Call from  Patient  With complaint of  High  Blood pressure last  Night.  It  Was  202/ 98.  Patient  Has  An  New  Blood pressure  Monitor  En route. She  Borrowed  The  Monitor  Last  Night.  It  Was an  Engineer, civil (consulting).  Patient   States  That  She  Has  Not missed  Any medication.  Denies  Chest pain, blurred  vision  Difficulty  Breathing.  Does  Report a  Headache.  Pain  Rated a  4.  Due to  The  Fact  That  Patient  PCP not  Available  Today  Schedule  Appointment  For today with  Dr. Alyson Ingles.    [facility] @2 :00pm@ [place] location.  Provided patient  With  Address.                                                                                                                                    Reason for Disposition . [1] Systolic BP  >= 160 OR Diastolic >= 100 AND [2] cardiac or neurologic symptoms (e.g., chest pain, difficulty breathing, unsteady gait, blurred vision)  Answer Assessment - Initial Assessment Questions 1. BLOOD PRESSURE: "What is the blood pressure?" "Did you take at least two measurements 5 minutes apart?"     202/98 2. ONSET: "When did you take your blood pressure?"     yester day 3. HOW: "How did you obtain the blood pressure?" (e.g., visiting nurse, automatic home BP monitor)     Auto matic 4. HISTORY: "Do you have a history of high blood pressure?"     yes 5. MEDICATIONS: "Are you taking any medications for blood pressure?" "Have you missed any doses recently?"     larsatan 6. OTHER SYMPTOMS: "Do you have any symptoms?" (e.g., headache, chest pain, blurred vision, difficulty breathing, weakness)     denies 7. PREGNANCY: "Is there any chance you are pregnant?" "When was your last menstrual period?"     na  Protocols used: HIGH BLOOD PRESSURE-A-AH

## 2018-03-12 NOTE — Telephone Encounter (Signed)
Patient called back- per agent- she states she was scheduled at [facility] for her elevated BP and she states they looked at her "funny" because she did not have insurance so she canceled the appointment and left. Patient is in tears and wants to see her PCP "she will understand".  Patient hung up before I could get her - attempted to call her back and got voice mail. Left message for her to call back.  Patient has called back- she does not have the money to pay for appointment today. Patient states her head hurts really bad. Call to office- they have no opening and advise ED if patient can not afford walk-in. Patient hung up again.  Call patient back- advised ED- she states she probably will not go- her head has been hurting for 3-4 days. Encouraged patient to go- they can relieve her pain and get her BP under control- she is thinking about it. Message sent to office.

## 2018-03-13 NOTE — Telephone Encounter (Signed)
rec ED for h/a and BP control I dont have any appts for the next 2 days  Does someone else in our office if she does not want to go to ED though this is what I rec  If we have a cancellation call pt for app t

## 2018-03-13 NOTE — Telephone Encounter (Signed)
Per chart patient did not go to ED.

## 2018-03-14 NOTE — Telephone Encounter (Signed)
Spoken to patient. She states her head isnt hurting as bead and her bp is below 130/90.  She doesn't want appointment or to go to ED at the moment.

## 2018-05-31 ENCOUNTER — Other Ambulatory Visit: Payer: Self-pay

## 2018-05-31 ENCOUNTER — Encounter: Payer: Self-pay | Admitting: Internal Medicine

## 2018-05-31 ENCOUNTER — Ambulatory Visit (INDEPENDENT_AMBULATORY_CARE_PROVIDER_SITE_OTHER): Payer: Self-pay | Admitting: Internal Medicine

## 2018-05-31 DIAGNOSIS — I10 Essential (primary) hypertension: Secondary | ICD-10-CM

## 2018-05-31 DIAGNOSIS — F419 Anxiety disorder, unspecified: Secondary | ICD-10-CM

## 2018-05-31 DIAGNOSIS — R7303 Prediabetes: Secondary | ICD-10-CM | POA: Insufficient documentation

## 2018-05-31 MED ORDER — SPIRONOLACTONE 50 MG PO TABS
50.0000 mg | ORAL_TABLET | Freq: Every day | ORAL | 11 refills | Status: DC
Start: 2018-05-31 — End: 2019-04-30

## 2018-05-31 MED ORDER — AMLODIPINE BESYLATE 10 MG PO TABS: 10.0000 mg | ORAL_TABLET | Freq: Every day | ORAL | 11 refills | Status: DC

## 2018-05-31 MED ORDER — LOSARTAN POTASSIUM 50 MG PO TABS: 50.0000 mg | ORAL_TABLET | Freq: Every day | ORAL | 3 refills | Status: DC

## 2018-05-31 MED ORDER — CHLORTHALIDONE 25 MG PO TABS: 25.0000 mg | ORAL_TABLET | Freq: Every day | ORAL | 11 refills | Status: DC

## 2018-05-31 NOTE — Progress Notes (Signed)
Refill losartan 90 day supply.

## 2018-05-31 NOTE — Progress Notes (Signed)
Virtual Visit via Video Note  I connected with Maria Quinn   on 05/31/18 at  8:19 AM EDT by a video enabled telemedicine application and verified that I am speaking with the correct person using two identifiers.  Location patient: home Location provider:work Persons participating in the virtual visit: patient, provider  I discussed the limitations of evaluation and management by telemedicine and the availability of in person appointments. The patient expressed understanding and agreed to proceed.   HPI: 1. HTN on norvasc 10, chlorthalidone 25, cozaar 50, spironolactone 50 BP readings 115/69, 117/74, 158/100, 138/82, 125/83 improved no side effects I.e dizziness or lightheadedness  2. Anxiety controlled for now at start on pandemic was increased   ROS: See pertinent positives and negatives per HPI.  Past Medical History:  Diagnosis Date  . Arthritis   . Chicken pox   . Depression   . Diabetes mellitus   . Gastric bypass for obesity complicating pregnancy, birth, puerperium   . Hypertension   . OSA (obstructive sleep apnea)     Past Surgical History:  Procedure Laterality Date  . ABDOMINAL HYSTERECTOMY     Dr. Lang SnowHenly 2012   . OOPHORECTOMY     38 y.o 1 ovary out per pt, later in life 2nd ovary out   . ROUX-EN-Y GASTRIC BYPASS     2010 losst down to 180s gained back   . TONSILLECTOMY     1987    Family History  Problem Relation Age of Onset  . Depression Mother   . Diabetes Mother   . Hypertension Mother   . Miscarriages / [place] Mother   . Cancer Father        prostate  . Diabetes Father   . Heart disease Father   . Hyperlipidemia Father   . Hypertension Father   . Kidney disease Father   . Stroke Father   . Depression Sister   . Alcohol abuse Brother   . Cancer Brother        lung   . Drug abuse Brother   . Arthritis Maternal Grandmother   . Diabetes Maternal Grandmother   . Hypertension Maternal Grandmother   . Stroke Maternal Grandmother   .  Alcohol abuse Maternal Grandfather   . Stroke Paternal Grandmother   . Cancer Paternal Grandfather        ? type  . Alcohol abuse Brother   . Early death Brother     SOCIAL HX: singer, lives at home    Current Outpatient Medications:  .  amLODipine (NORVASC) 10 MG tablet, Take 1 tablet (10 mg total) by mouth daily., Disp: 30 tablet, Rfl: 11 .  chlorthalidone (HYGROTON) 25 MG tablet, Take 1 tablet (25 mg total) by mouth daily., Disp: 30 tablet, Rfl: 11 .  Coenzyme Q10 (COQ-10) 10 MG CAPS, Take by mouth., Disp: , Rfl:  .  fluticasone (FLONASE) 50 MCG/ACT nasal spray, Place 2 sprays into both nostrils daily., Disp: 16 g, Rfl: 6 .  losartan (COZAAR) 50 MG tablet, Take 1 tablet (50 mg total) by mouth daily. At night, Disp: 90 tablet, Rfl: 3 .  Multiple Vitamins-Minerals (CENTRUM WOMEN PO), Take by mouth., Disp: , Rfl:  .  spironolactone (ALDACTONE) 50 MG tablet, Take 1 tablet (50 mg total) by mouth daily., Disp: 30 tablet, Rfl: 11  EXAM:  VITALS per patient if applicable:  GENERAL: alert, oriented, appears well and in no acute distress  HEENT: atraumatic, conjunttiva clear, no obvious abnormalities on inspection of external nose and  ears  NECK: normal movements of the head and neck  LUNGS: on inspection no signs of respiratory distress, breathing rate appears normal, no obvious gross SOB, gasping or wheezing  CV: no obvious cyanosis  MS: moves all visible extremities without noticeable abnormality  PSYCH/NEURO: pleasant and cooperative, no obvious depression or anxiety, speech and thought processing grossly intact  ASSESSMENT AND PLAN:  Discussed the following assessment and plan:  Anxiety-controlled   Essential hypertension - Plan: losartan (COZAAR) 50 MG tablet, spironolactone (ALDACTONE) 50 MG tablet, chlorthalidone (HYGROTON) 25 MG tablet, amLODipine (NORVASC) 10 MG tablet Taking all in the am  Cont to monitor BP doing well    Prediabetes   HM 02/21/2018 PHQ9 socre  4 and GAD 7 score 3  Declines flu shot  Tdap given info no insurance currently  Check hep B statusin future currently pt w/o insurance  HIV neg 10/22/10 declines STD check  Last eye exam 2017  LMP 2012 s/p hysterectomy both ovaries removed previously -CT ab/pelvis 10/19/10 c/w complex cystic mass in uterus with elevated CA 125 -per path report removal uterus, b/l fallopian tubes and cervix neg malignancy or dysplasia 10/2010 Dr. Lang Snow  Former smoker rec take Mvt with iron mammo due 01/15/2019 will be 38 y.o    I discussed the assessment and treatment plan with the patient. The patient was provided an opportunity to ask questions and all were answered. The patient agreed with the plan and demonstrated an understanding of the instructions.   The patient was advised to call back or seek an in-person evaluation if the symptoms worsen or if the condition fails to improve as anticipated.  Time spent 10 minutes  Bevelyn Buckles, MD

## 2018-08-27 ENCOUNTER — Ambulatory Visit (INDEPENDENT_AMBULATORY_CARE_PROVIDER_SITE_OTHER): Payer: Self-pay | Admitting: Family Medicine

## 2018-08-27 ENCOUNTER — Encounter: Payer: Self-pay | Admitting: Family Medicine

## 2018-08-27 ENCOUNTER — Other Ambulatory Visit: Payer: Self-pay

## 2018-08-27 VITALS — BP 120/70 | HR 81 | Temp 98.0°F | Ht 65.0 in | Wt 310.4 lb

## 2018-08-27 DIAGNOSIS — R519 Headache, unspecified: Secondary | ICD-10-CM | POA: Insufficient documentation

## 2018-08-27 DIAGNOSIS — R51 Headache: Secondary | ICD-10-CM

## 2018-08-27 HISTORY — DX: Headache, unspecified: R51.9

## 2018-08-27 NOTE — Patient Instructions (Signed)
Nice to see you. We will get labs today to evaluate for a potential cause.  If your symptoms worsen significantly or if you develop numbness, weakness, vision changes, eye redness, severe head pain, fevers, or any new or changing symptoms please get looked at again.

## 2018-08-27 NOTE — Assessment & Plan Note (Signed)
New onset issue.  She has a benign exam other than some mild tenderness over the entirety of her right side of her face.  Reassuring neurological exam.  Reassuring ENT exam.  She will take Tylenol for pain.  We will check a sed rate, CBC with differential, and CMP.  Consider imaging or prednisone depending on results.  Given return precautions.

## 2018-08-27 NOTE — Progress Notes (Signed)
Tommi Rumps, MD Phone: [phone]  Maria Quinn is a 38 y.o. female who presents today for same-day visit.  Right face pain: Patient notes this started 5 days ago.  She notes it is from the midline of her face all the way over to her right ear including V1 through V3 distribution areas. There are times when the pain is worse, though currently it is mild.  She notes no congestion, rhinorrhea, cough, postnasal drip, fevers, vision changes, numbness, weakness, rash, vomiting, nausea, diarrhea.  She notes no jaw claudication.  She does note her neck has been a little tight though that has been an intermittent issue over the last year plus and only occurs when she turns her head one direction for an extended period of time.  She was evaluated for the neck tightness over a year ago in the ED.  She had a negative evaluation and it was felt to be muscle spasm related at that time.  She does have a history of Bell's palsy on the right.  She reports she had a fall down about 4 stairs in June she had no head injury or neck injury or loss of consciousness.  Social History   Tobacco Use  Smoking Status Former Smoker  Smokeless Tobacco Never Used     ROS see history of present illness  Objective  Physical Exam Vitals:   08/27/18 1519  BP: 120/70  Pulse: 81  Temp: 98 F (36.7 C)  SpO2: 98%    BP Readings from Last 3 Encounters:  08/27/18 120/70  02/21/18 (!) 144/102  01/23/18 (!) 142/94   Wt Readings from Last 3 Encounters:  08/27/18 (!) 310 lb 6.4 oz (140.8 kg)  02/21/18 (!) 315 lb 12.8 oz (143.2 kg)  01/23/18 (!) 312 lb 3.2 oz (141.6 kg)    Physical Exam Constitutional:      General: She is not in acute distress.    Appearance: She is not diaphoretic.  HENT:     Head: Normocephalic and atraumatic.     Comments: Patient does have mild tenderness over the entire right side of her face anteriorly and laterally over her temple and near her ear    Right Ear: Tympanic membrane  and ear canal normal.     Left Ear: Tympanic membrane and ear canal normal.     Mouth/Throat:     Mouth: Mucous membranes are moist.     Pharynx: Oropharynx is clear.  Eyes:     Conjunctiva/sclera: Conjunctivae normal.     Pupils: Pupils are equal, round, and reactive to light.  Neck:     Musculoskeletal: Normal range of motion and neck supple.     Vascular: No carotid bruit.  Cardiovascular:     Rate and Rhythm: Normal rate and regular rhythm.     Heart sounds: Normal heart sounds.  Pulmonary:     Effort: Pulmonary effort is normal.     Breath sounds: Normal breath sounds.  Musculoskeletal:     Right lower leg: No edema.     Left lower leg: No edema.  Lymphadenopathy:     Cervical: No cervical adenopathy.  Skin:    General: Skin is warm and dry.  Neurological:     Mental Status: She is alert.     Comments: CN 2-12 intact, 5/5 strength in bilateral biceps, triceps, grip, quads, hamstrings, plantar and dorsiflexion, sensation to light touch intact in bilateral UE and LE, normal gait      Assessment/Plan: Please see individual problem  list.  Right-sided face pain New onset issue.  She has a benign exam other than some mild tenderness over the entirety of her right side of her face.  Reassuring neurological exam.  Reassuring ENT exam.  She will take Tylenol for pain.  We will check a sed rate, CBC with differential, and CMP.  Consider imaging or prednisone depending on results.  Given return precautions.   Orders Placed This Encounter  Procedures  . Sedimentation rate  . CBC with Differential/Platelet  . Comp Met (CMET)    No orders of the defined types were placed in this encounter.    Tommi Rumps, MD [place]

## 2018-08-28 LAB — COMPREHENSIVE METABOLIC PANEL
ALT: 14 U/L (ref 0–35)
AST: 11 U/L (ref 0–37)
Albumin: 4.2 g/dL (ref 3.5–5.2)
Alkaline Phosphatase: 46 U/L (ref 39–117)
BUN: 28 mg/dL — ABNORMAL HIGH (ref 6–23)
CO2: 24 mEq/L (ref 19–32)
Calcium: 9.2 mg/dL (ref 8.4–10.5)
Chloride: 104 mEq/L (ref 96–112)
Creatinine, Ser: 0.98 mg/dL (ref 0.40–1.20)
GFR: 76.2 mL/min (ref >60.00)
Glucose, Bld: 98 mg/dL (ref 70–99)
Potassium: 3.9 mEq/L (ref 3.5–5.1)
Sodium: 138 mEq/L (ref 135–145)
Total Bilirubin: 0.4 mg/dL (ref 0.2–1.2)
Total Protein: 7.3 g/dL (ref 6.0–8.3)

## 2018-08-28 LAB — CBC WITH DIFFERENTIAL/PLATELET
Basophils Absolute: 0 10*3/uL (ref 0.0–0.1)
Basophils Relative: 0.5 % (ref 0.0–3.0)
Eosinophils Absolute: 0 10*3/uL (ref 0.0–0.7)
Eosinophils Relative: 0.5 % (ref 0.0–5.0)
HCT: 37 % (ref 36.0–46.0)
Hemoglobin: 11.6 g/dL — ABNORMAL LOW (ref 12.0–15.0)
Lymphocytes Relative: 20.7 % (ref 12.0–46.0)
Lymphs Abs: 1.8 10*3/uL (ref 0.7–4.0)
MCHC: 31.4 g/dL (ref 30.0–36.0)
MCV: 76 fl — ABNORMAL LOW (ref 78.0–100.0)
Monocytes Absolute: 1 10*3/uL (ref 0.1–1.0)
Monocytes Relative: 11.7 % (ref 3.0–12.0)
Neutro Abs: 5.6 10*3/uL (ref 1.4–7.7)
Neutrophils Relative %: 66.6 % (ref 43.0–77.0)
Platelets: 275 10*3/uL (ref 150.0–400.0)
RBC: 4.87 Mil/uL (ref 3.87–5.11)
RDW: 15.7 % — ABNORMAL HIGH (ref 11.5–15.5)
WBC: 8.5 10*3/uL (ref 4.0–10.5)

## 2018-08-28 LAB — SEDIMENTATION RATE: Sed Rate: 55 mm/hr — ABNORMAL HIGH (ref 0–20)

## 2018-09-01 ENCOUNTER — Other Ambulatory Visit: Payer: Self-pay | Admitting: Family Medicine

## 2018-09-01 DIAGNOSIS — D509 Iron deficiency anemia, unspecified: Secondary | ICD-10-CM

## 2018-09-11 ENCOUNTER — Ambulatory Visit (INDEPENDENT_AMBULATORY_CARE_PROVIDER_SITE_OTHER): Payer: Self-pay | Admitting: Internal Medicine

## 2018-09-11 ENCOUNTER — Encounter: Payer: Self-pay | Admitting: Internal Medicine

## 2018-09-11 ENCOUNTER — Other Ambulatory Visit: Payer: Self-pay

## 2018-09-11 VITALS — Ht 65.0 in | Wt 310.0 lb

## 2018-09-11 DIAGNOSIS — R51 Headache: Secondary | ICD-10-CM

## 2018-09-11 DIAGNOSIS — D509 Iron deficiency anemia, unspecified: Secondary | ICD-10-CM

## 2018-09-11 DIAGNOSIS — R519 Headache, unspecified: Secondary | ICD-10-CM

## 2018-09-11 DIAGNOSIS — I10 Essential (primary) hypertension: Secondary | ICD-10-CM

## 2018-09-11 NOTE — Progress Notes (Signed)
Virtual Visit via Video Note  I connected with Maria Quinn  on 09/11/18 at 10:00 AM EDT by a video enabled telemedicine application and verified that I am speaking with the correct person using two identifiers.  Location patient: home Location provider:work or home office Persons participating in the virtual visit: patient, provider, pts dad  I discussed the limitations of evaluation and management by telemedicine and the availability of in person appointments. The patient expressed understanding and agreed to proceed.   HPI: 1. HTN not checking due to BP cuff brock on norvasc 10, chlorthalidone 25 mg qd, losartan 50 mg qd and spironolactone 50 mg qd  2. Right sided facial pain improved went from 10/10 to 2/10 today nagging pain. Pain was right eyebrow, right eye, lips right ear, right sided gums swollen, right jawline and right side of nose. Area is still painful but pain reduced she only took Tylenol for pain and pain now just nagging She denies numbness/tingling, dental issues she can see but has not seen a dentist in a while due to no insurance, denies oral herpes outbreak, denies jaw popping  3. S/p gastric bypass with iron def appt [facility] 10/2018 pt will move appt back due to not currently having insurance advised to move appt back to 12/2018 but likely s/p gastric bypass causing iron def she had gastric bypass in 2010     ROS: See pertinent positives and negatives per HPI.  Past Medical History:  Diagnosis Date  . Arthritis   . Chicken pox   . Depression   . Diabetes mellitus   . Gastric bypass for obesity complicating pregnancy, birth, puerperium   . Hypertension   . OSA (obstructive sleep apnea)     Past Surgical History:  Procedure Laterality Date  . ABDOMINAL HYSTERECTOMY     Dr. Daiva Huge 2012   . OOPHORECTOMY     38 y.o 1 ovary out per pt, later in life 2nd ovary out   . ROUX-EN-Y GASTRIC BYPASS     2010 losst down to 180s gained back   . TONSILLECTOMY     1987     Family History  Problem Relation Age of Onset  . Depression Mother   . Diabetes Mother   . Hypertension Mother   . Miscarriages / [place] Mother   . Cancer Father        prostate  . Diabetes Father   . Heart disease Father   . Hyperlipidemia Father   . Hypertension Father   . Kidney disease Father   . Stroke Father   . Depression Sister   . Alcohol abuse Brother   . Cancer Brother        lung   . Drug abuse Brother   . Arthritis Maternal Grandmother   . Diabetes Maternal Grandmother   . Hypertension Maternal Grandmother   . Stroke Maternal Grandmother   . Alcohol abuse Maternal Grandfather   . Stroke Paternal Grandmother   . Cancer Paternal Grandfather        ? type  . Alcohol abuse Brother   . Early death Brother     SOCIAL HX: lives at home with dad   Current Outpatient Medications:  .  amLODipine (NORVASC) 10 MG tablet, Take 1 tablet (10 mg total) by mouth daily., Disp: 30 tablet, Rfl: 11 .  chlorthalidone (HYGROTON) 25 MG tablet, Take 1 tablet (25 mg total) by mouth daily., Disp: 30 tablet, Rfl: 11 .  Cholecalciferol (VITAMIN D) 50 MCG (2000 UT)  CAPS, Take by mouth daily., Disp: , Rfl:  .  losartan (COZAAR) 50 MG tablet, Take 1 tablet (50 mg total) by mouth daily. At night, Disp: 90 tablet, Rfl: 3 .  Multiple Vitamins-Minerals (CENTRUM WOMEN PO), Take by mouth., Disp: , Rfl:  .  spironolactone (ALDACTONE) 50 MG tablet, Take 1 tablet (50 mg total) by mouth daily., Disp: 30 tablet, Rfl: 11 .  vitamin B-12 (CYANOCOBALAMIN) 1000 MCG tablet, Take 1,000 mcg by mouth daily., Disp: , Rfl:  .  Coenzyme Q10 (COQ-10) 10 MG CAPS, Take by mouth., Disp: , Rfl:  .  fluticasone (FLONASE) 50 MCG/ACT nasal spray, Place 2 sprays into both nostrils daily. (Patient not taking: Reported on 09/11/2018), Disp: 16 g, Rfl: 6  EXAM:  VITALS per patient if applicable:  GENERAL: alert, oriented, appears well and in no acute distress  HEENT: atraumatic, conjunttiva clear, no obvious  abnormalities on inspection of external nose and ears  NECK: normal movements of the head and neck  LUNGS: on inspection no signs of respiratory distress, breathing rate appears normal, no obvious gross SOB, gasping or wheezing  CV: no obvious cyanosis  MS: moves all visible extremities without noticeable abnormality  PSYCH/NEURO: pleasant and cooperative, no obvious depression or anxiety, speech and thought processing grossly intact  ASSESSMENT AND PLAN:  Discussed the following assessment and plan:  Essential hypertension -cont meds rec buy new BP cuff   Iron deficiency anemia likely related to gastric bypass  -can f/u with GI move appt back to 12/2018   Right facial pain unclear etiology ddx sinus, dental, migraine, TMJ denies trigeminal neuralgia sxs, HSV sx's -if pain returns consider referral to neurology [facility] [place] Francetta [place]  HM Declines flu shot  Tdap given infono insurance currently Check hep B statusin future currently pt w/o insurance  HIV neg 10/22/10 declines STD check  Last eye exam 2017  LMP 2012 s/p hysterectomy both ovaries removed previously -CT ab/pelvis 10/19/10 c/w complex cystic mass in uterus with elevated CA 125 -per path report removal uterus, b/l fallopian tubes and cervix neg malignancy or dysplasia 10/2010 Dr. Lang SnowHenly  Former smoker rec take Mvt with iron mammo due 01/15/2019 will be 38 y.o    I discussed the assessment and treatment plan with the patient. The patient was provided an opportunity to ask questions and all were answered. The patient agreed with the plan and demonstrated an understanding of the instructions.   The patient was advised to call back or seek an in-person evaluation if the symptoms worsen or if the condition fails to improve as anticipated.  Time spent 15 minutes Bevelyn Bucklesracy N McLean-Scocuzza, MD

## 2018-10-24 ENCOUNTER — Ambulatory Visit: Payer: Self-pay | Admitting: Gastroenterology

## 2018-11-18 ENCOUNTER — Ambulatory Visit: Payer: Self-pay

## 2018-11-18 NOTE — Telephone Encounter (Signed)
Patient caled stating that she has had Chest pain with exertion intermittent.  She states the pain is left chest and shoulder and travels down her arm. She states she has noticed the pain over several days and feels that it is happening more frequent.  She has Hx of heart issues from an illness when she was young.  She had HTN.  She states the pain is about 5 when it comes and it makes her feel dizzy. Care advice read to patient. Patient will go to ER for evaluation of symptom. Patient verbalized understanding of instructions.  Reason for Disposition . [1] Chest pain lasts > 5 minutes AND [2] occurred in past 3 days (72 hours)  Answer Assessment - Initial Assessment Questions 1. LOCATION: "Where does it hurt?"       Left chest to shoulder hand arm 2. RADIATION: "Does the pain go anywhere else?" (e.g., into neck, jaw, arms, back)    Arm shoulder 3. ONSET: "When did the chest pain begin?" (Minutes, hours or days)     Not now 4. PATTERN "Does the pain come and go, or has it been constant since it started?"  "Does it get worse with exertion?"      Come and go, most when exerting herself on stage 5. DURATION: "How long does it last" (e.g., seconds, minutes, hours)     Minutes possible 10 6. SEVERITY: "How bad is the pain?"  (e.g., Scale 1-10; mild, moderate, or severe)    - MILD (1-3): doesn't interfere with normal activities     - MODERATE (4-7): interferes with normal activities or awakens from sleep    - SEVERE (8-10): excruciating pain, unable to do any normal activities      5 7. CARDIAC RISK FACTORS: "Do you have any history of heart problems or risk factors for heart disease?" (e.g., angina, prior heart attack; diabetes, high blood pressure, high cholesterol, smoker, or strong family history of heart disease)     Some heart HX after illness 8. PULMONARY RISK FACTORS: "Do you have any history of lung disease?"  (e.g., blood clots in lung, asthma, emphysema, birth control pills)    none 9.  CAUSE: "What do you think is causing the chest pain?"    unsure 10. OTHER SYMPTOMS: "Do you have any other symptoms?" (e.g., dizziness, nausea, vomiting, sweating, fever, difficulty breathing, cough)      dizzy 11. PREGNANCY: "Is there any chance you are pregnant?" "When was your last menstrual period?"       No histerectomy  Protocols used: CHEST PAIN-A-AH

## 2018-12-10 ENCOUNTER — Ambulatory Visit: Payer: Self-pay | Admitting: Internal Medicine

## 2018-12-16 ENCOUNTER — Ambulatory Visit: Payer: Self-pay | Admitting: Gastroenterology

## 2018-12-16 ENCOUNTER — Other Ambulatory Visit: Payer: Self-pay

## 2018-12-16 ENCOUNTER — Encounter: Payer: Self-pay | Admitting: Gastroenterology

## 2018-12-16 VITALS — BP 132/81 | HR 72 | Temp 98.4°F | Wt 314.4 lb

## 2018-12-16 DIAGNOSIS — D509 Iron deficiency anemia, unspecified: Secondary | ICD-10-CM

## 2018-12-16 MED ORDER — BISACODYL EC 5 MG PO TBEC: DELAYED_RELEASE_TABLET | ORAL | 0 refills | Status: DC

## 2018-12-16 MED ORDER — NA SULFATE-K SULFATE-MG SULF 17.5-3.13-1.6 GM/177ML PO SOLN: 354.0000 mL | Freq: Once | ORAL | 0 refills | Status: AC

## 2018-12-16 NOTE — Progress Notes (Signed)
re

## 2018-12-18 NOTE — Progress Notes (Signed)
Maria Quinn [address]  [place]  [place], [place] [address]  Main: [phone]  Fax: [phone]   Gastroenterology Consultation  Referring Provider:     Leone Haven, MD Primary Care Physician:  McLean-Scocuzza, Nino Glow, MD Reason for Consultation:     Anemia        HPI:    Chief Complaint  Patient presents with  . New Patient (Initial Visit)  . Anemia    Patient has had fatigue     Maria Quinn is a 38 y.o. y/o female referred for consultation & management  by Dr. Terese Door, Nino Glow, MD.  Patient with history of gastric bypass referred for iron deficiency anemia.  Reports history of an EGD and colonoscopy prior to her bypass about 10 to 12 years ago.  Does not remember any abnormalities that she was told about.  We do not have these reports.  No blood in stool.  No abdominal pain.  No nausea or vomiting.  No weight loss.  Past Medical History:  Diagnosis Date  . Arthritis   . Chicken pox   . Depression   . Diabetes mellitus   . Gastric bypass for obesity complicating pregnancy, birth, puerperium   . Hypertension   . OSA (obstructive sleep apnea)     Past Surgical History:  Procedure Laterality Date  . ABDOMINAL HYSTERECTOMY     Dr. Daiva Huge 2012   . OOPHORECTOMY     38 y.o 1 ovary out per pt, later in life 2nd ovary out   . ROUX-EN-Y GASTRIC BYPASS     2010 losst down to 180s gained back   . TONSILLECTOMY     1987    Prior to Admission medications   Medication Sig Start Date End Date Taking? Authorizing Provider  amLODipine (NORVASC) 10 MG tablet Take 1 tablet (10 mg total) by mouth daily. 05/31/18  Yes McLean-Scocuzza, Nino Glow, MD  chlorthalidone (HYGROTON) 25 MG tablet Take 1 tablet (25 mg total) by mouth daily. 05/31/18  Yes McLean-Scocuzza, Nino Glow, MD  Cholecalciferol (VITAMIN D) 50 MCG (2000 UT) CAPS Take by mouth daily.   Yes [provider]  Coenzyme Q10 (COQ-10) 10 MG CAPS Take by mouth.   Yes [provider]  losartan (COZAAR) 50 MG tablet Take 1 tablet (50 mg total) by mouth daily. At night 05/31/18  Yes McLean-Scocuzza, Nino Glow, MD  Multiple Vitamins-Minerals (CENTRUM WOMEN PO) Take by mouth.   Yes [provider]  spironolactone (ALDACTONE) 50 MG tablet Take 1 tablet (50 mg total) by mouth daily. 05/31/18  Yes McLean-Scocuzza, Nino Glow, MD  vitamin B-12 (CYANOCOBALAMIN) 1000 MCG tablet Take 1,000 mcg by mouth daily.   Yes [provider]  bisacodyl (BISACODYL) 5 MG EC tablet Take 2 tablets (10mg ) by mouth the day before your procedure between 1pm-3pm. 12/16/18   Virgel Manifold, MD    Family History  Problem Relation Age of Onset  . Depression Mother   . Diabetes Mother   . Hypertension Mother   . Miscarriages / [place] Mother   . Cancer Father        prostate  . Diabetes Father   . Heart disease Father   . Hyperlipidemia Father   . Hypertension Father   . Kidney disease Father   . Stroke Father   . Depression Sister   . Alcohol abuse Brother   . Cancer Brother        lung   . Drug  abuse Brother   . Arthritis Maternal Grandmother   . Diabetes Maternal Grandmother   . Hypertension Maternal Grandmother   . Stroke Maternal Grandmother   . Alcohol abuse Maternal Grandfather   . Stroke Paternal Grandmother   . Cancer Paternal Grandfather        ? type  . Alcohol abuse Brother   . Early death Brother      Social History   Tobacco Use  . Smoking status: Former Games developer  . Smokeless tobacco: Never Used  Substance Use Topics  . Alcohol use: Yes    Alcohol/week: 2.0 - 3.0 standard drinks    Types: 2 - 3 Glasses of wine per week  . Drug use: No    Allergies as of 12/16/2018 - Review Complete 12/16/2018  Allergen Reaction Noted  . Penicillins Rash 07/15/2010    Review of Systems:    All systems reviewed and negative except where noted in HPI.   Physical Exam:  BP 132/81 (BP Location: Left Arm, Patient Position: Sitting, Cuff Size: Large)   Pulse  72   Temp 98.4 F (36.9 C) (Oral)   Wt (!) 314 lb 6 oz (142.6 kg)   LMP 10/19/2010   BMI 52.31 kg/m  Patient's last menstrual period was 10/19/2010. Psych:  Alert and cooperative. Normal mood and affect. General:   Alert,  Well-developed, well-nourished, pleasant and cooperative in NAD Head:  Normocephalic and atraumatic. Eyes:  Sclera clear, no icterus.   Conjunctiva pink. Ears:  Normal auditory acuity. Nose:  No deformity, discharge, or lesions. Mouth:  No deformity or lesions,oropharynx pink & moist. Neck:  Supple; no masses or thyromegaly. Abdomen:  Normal bowel sounds.  No bruits.  Soft, non-tender and non-distended without masses, hepatosplenomegaly or hernias noted.  No guarding or rebound tenderness.    Msk:  Symmetrical without gross deformities. Good, equal movement & strength bilaterally. Pulses:  Normal pulses noted. Extremities:  No clubbing or edema.  No cyanosis. Neurologic:  Alert and oriented x3;  grossly normal neurologically. Skin:  Intact without significant lesions or rashes. No jaundice. Lymph Nodes:  No significant cervical adenopathy. Psych:  Alert and cooperative. Normal mood and affect.   Labs: CBC    Component Value Date/Time   WBC 8.5 08/27/2018 1558   RBC 4.87 08/27/2018 1558   HGB 11.6 (L) 08/27/2018 1558   HGB 12.6 02/24/2011 1403   HCT 37.0 08/27/2018 1558   HCT 38.0 02/24/2011 1403   PLT 275.0 08/27/2018 1558   PLT 221 02/24/2011 1403   MCV 76.0 (L) 08/27/2018 1558   MCV 74 (L) 02/24/2011 1403   MCH 21.3 (L) 12/20/2016 1524   MCHC 31.4 08/27/2018 1558   RDW 15.7 (H) 08/27/2018 1558   RDW 17.2 (H) 02/24/2011 1403   LYMPHSABS 1.8 08/27/2018 1558   LYMPHSABS 1.9 02/24/2011 1403   MONOABS 1.0 08/27/2018 1558   MONOABS 0.7 02/24/2011 1403   EOSABS 0.0 08/27/2018 1558   EOSABS 0.0 02/24/2011 1403   BASOSABS 0.0 08/27/2018 1558   BASOSABS 0.0 02/24/2011 1403   CMP     Component Value Date/Time   NA 138 08/27/2018 1558   K 3.9  08/27/2018 1558   CL 104 08/27/2018 1558   CO2 24 08/27/2018 1558   GLUCOSE 98 08/27/2018 1558   BUN 28 (H) 08/27/2018 1558   CREATININE 0.98 08/27/2018 1558   CALCIUM 9.2 08/27/2018 1558   PROT 7.3 08/27/2018 1558   ALBUMIN 4.2 08/27/2018 1558   AST 11 08/27/2018 1558  ALT 14 08/27/2018 1558   ALKPHOS 46 08/27/2018 1558   BILITOT 0.4 08/27/2018 1558   GFRNONAA >60 08/19/2017 2339   GFRAA >60 08/19/2017 2339    Imaging Studies: No results found.  Assessment and Plan:   Maria Quinn is a 38 y.o. y/o female has been referred for iron deficiency anemia with history of gastric bypass  Iron deficiency anemia likely due to her bypass status However, important to rule out any under lying causes and anastomotic ulcers  Patient willing to proceed with EGD and colonoscopy for further evaluation  I have discussed alternative options, risks & benefits,  which include, but are not limited to, bleeding, infection, perforation,respiratory complication & drug reaction.  The patient agrees with this plan & written consent will be obtained.       Dr Melodie BouillonVarnita Osha Rane  Speech recognition software was used to dictate the above note.

## 2018-12-25 ENCOUNTER — Telehealth: Payer: Self-pay

## 2018-12-25 LAB — PANCREATIC ELASTASE, FECAL: Pancreatic Elastase, Fecal: 82 ug Elast./g — ABNORMAL LOW (ref >200)

## 2018-12-25 NOTE — Telephone Encounter (Signed)
Patient states she has 4-5 bowel movements a day. Patient states she has some abdominal bloating but she does have abdominal pain especially after she eats. Patient states if she eats breakfast she will have abdominal pain after. Patient wonders is this causing her weight gain. She states she tries to eat healthy but keeps gaining weight. Patient states she will be willing to try a medication for it

## 2018-12-25 NOTE — Telephone Encounter (Signed)
Tried to call patient but it rang and then disconnected

## 2018-12-25 NOTE — Telephone Encounter (Signed)
-----   Message from Virgel Manifold, MD sent at 12/25/2018  3:51 PM EST ----- Caryl Pina please let the patient know, her stool test shows pancreatic insufficiency which can happen with gastric bypass anatomy and the way the enzymes are secreted into a different location. How many Bms a day is she having? Does she have abdominal bloating. We can prescribe pancreatic enzymes based on this.

## 2018-12-26 ENCOUNTER — Other Ambulatory Visit: Payer: Self-pay | Admitting: Gastroenterology

## 2018-12-26 ENCOUNTER — Telehealth: Payer: Self-pay | Admitting: Gastroenterology

## 2018-12-26 MED ORDER — PANCRELIPASE (LIP-PROT-AMYL) 36000-114000 UNITS PO CPEP: 36000.0000 [IU] | ORAL_CAPSULE | Freq: Three times a day (TID) | ORAL | 2 refills | Status: DC

## 2018-12-26 NOTE — Telephone Encounter (Signed)
Filled it out and re faxed the form

## 2018-12-26 NOTE — Telephone Encounter (Signed)
Maria Quinn from [facility] left vm regarding the form that was filled out she states there is information that needs to be confirmed the EPI box needs to be checked for legal reasons please call [phone] or fax form  To [phone]

## 2018-12-26 NOTE — Telephone Encounter (Signed)
Patient verbalized understanding and fax prescription and information sheet to the speciality pharmacy

## 2018-12-27 ENCOUNTER — Other Ambulatory Visit
Admission: RE | Admit: 2018-12-27 | Discharge: 2018-12-27 | Disposition: A | Discharge status: Home / Self Care | Payer: Self-pay | Source: Ambulatory Visit | Attending: Gastroenterology | Admitting: Gastroenterology

## 2018-12-27 ENCOUNTER — Other Ambulatory Visit: Payer: Self-pay

## 2018-12-27 DIAGNOSIS — Z01812 Encounter for preprocedural laboratory examination: Secondary | ICD-10-CM | POA: Insufficient documentation

## 2018-12-27 DIAGNOSIS — Z20828 Contact with and (suspected) exposure to other viral communicable diseases: Secondary | ICD-10-CM | POA: Insufficient documentation

## 2018-12-27 LAB — SARS CORONAVIRUS 2 (TAT 6-24 HRS): SARS Coronavirus 2: NEGATIVE

## 2018-12-31 ENCOUNTER — Encounter: Payer: Self-pay | Admitting: Gastroenterology

## 2019-01-01 ENCOUNTER — Encounter
Admission: RE | Disposition: A | Discharge status: Home / Self Care | Payer: Self-pay | Source: Home / Self Care | Attending: Gastroenterology

## 2019-01-01 ENCOUNTER — Ambulatory Visit
Admission: RE | Admit: 2019-01-01 | Discharge: 2019-01-01 | Disposition: A | Discharge status: Home / Self Care | Payer: Self-pay | Attending: Gastroenterology | Admitting: Gastroenterology

## 2019-01-01 ENCOUNTER — Encounter: Payer: Self-pay | Admitting: Gastroenterology

## 2019-01-01 ENCOUNTER — Ambulatory Visit: Payer: Self-pay | Admitting: Certified Registered Nurse Anesthetist

## 2019-01-01 DIAGNOSIS — Z9884 Bariatric surgery status: Secondary | ICD-10-CM

## 2019-01-01 DIAGNOSIS — Z6841 Body Mass Index (BMI) 40.0 and over, adult: Secondary | ICD-10-CM | POA: Insufficient documentation

## 2019-01-01 DIAGNOSIS — E669 Obesity, unspecified: Secondary | ICD-10-CM | POA: Insufficient documentation

## 2019-01-01 DIAGNOSIS — Z880 Allergy status to penicillin: Secondary | ICD-10-CM | POA: Insufficient documentation

## 2019-01-01 DIAGNOSIS — K5730 Diverticulosis of large intestine without perforation or abscess without bleeding: Secondary | ICD-10-CM | POA: Insufficient documentation

## 2019-01-01 DIAGNOSIS — M1990 Unspecified osteoarthritis, unspecified site: Secondary | ICD-10-CM | POA: Insufficient documentation

## 2019-01-01 DIAGNOSIS — D509 Iron deficiency anemia, unspecified: Secondary | ICD-10-CM

## 2019-01-01 DIAGNOSIS — Z8261 Family history of arthritis: Secondary | ICD-10-CM | POA: Insufficient documentation

## 2019-01-01 DIAGNOSIS — G4733 Obstructive sleep apnea (adult) (pediatric): Secondary | ICD-10-CM | POA: Insufficient documentation

## 2019-01-01 DIAGNOSIS — Z79899 Other long term (current) drug therapy: Secondary | ICD-10-CM | POA: Insufficient documentation

## 2019-01-01 DIAGNOSIS — Z8249 Family history of ischemic heart disease and other diseases of the circulatory system: Secondary | ICD-10-CM | POA: Insufficient documentation

## 2019-01-01 DIAGNOSIS — I10 Essential (primary) hypertension: Secondary | ICD-10-CM | POA: Insufficient documentation

## 2019-01-01 HISTORY — PX: COLONOSCOPY WITH PROPOFOL: SHX5780

## 2019-01-01 HISTORY — PX: ESOPHAGOGASTRODUODENOSCOPY (EGD) WITH PROPOFOL: SHX5813

## 2019-01-01 SURGERY — COLONOSCOPY WITH PROPOFOL
Anesthesia: General

## 2019-01-01 MED ORDER — GLYCOPYRROLATE 0.2 MG/ML IJ SOLN
INTRAMUSCULAR | Status: DC | PRN
  Administered 2019-01-01: .2 mg via INTRAVENOUS

## 2019-01-01 MED ORDER — SODIUM CHLORIDE 0.9 % IV SOLN: INTRAVENOUS | Status: DC

## 2019-01-01 MED ORDER — LIDOCAINE HCL (CARDIAC) PF 100 MG/5ML IV SOSY
PREFILLED_SYRINGE | INTRAVENOUS | Status: DC | PRN
  Administered 2019-01-01: 100 mg via INTRAVENOUS

## 2019-01-01 MED ORDER — PROPOFOL 500 MG/50ML IV EMUL
INTRAVENOUS | Status: DC | PRN
  Administered 2019-01-01: 140 ug/kg/min via INTRAVENOUS

## 2019-01-01 MED ORDER — PHENYLEPHRINE HCL (PRESSORS) 10 MG/ML IV SOLN
INTRAVENOUS | Status: AC
  Filled 2019-01-01: qty 1

## 2019-01-01 MED ORDER — PROPOFOL 500 MG/50ML IV EMUL
INTRAVENOUS | Status: AC
  Filled 2019-01-01: qty 50

## 2019-01-01 MED ORDER — PHENYLEPHRINE HCL (PRESSORS) 10 MG/ML IV SOLN
INTRAVENOUS | Status: DC | PRN
  Administered 2019-01-01: 100 ug via INTRAVENOUS

## 2019-01-01 MED ORDER — EPHEDRINE SULFATE 50 MG/ML IJ SOLN
INTRAMUSCULAR | Status: AC
  Filled 2019-01-01: qty 1

## 2019-01-01 MED ORDER — GLYCOPYRROLATE 0.2 MG/ML IJ SOLN
INTRAMUSCULAR | Status: AC
  Filled 2019-01-01: qty 3

## 2019-01-01 MED ORDER — SODIUM CHLORIDE (PF) 0.9 % IJ SOLN
INTRAMUSCULAR | Status: AC
  Filled 2019-01-01: qty 10

## 2019-01-01 MED ORDER — PROPOFOL 10 MG/ML IV BOLUS
INTRAVENOUS | Status: DC | PRN
  Administered 2019-01-01: 30 mg via INTRAVENOUS
  Administered 2019-01-01: 20 mg via INTRAVENOUS
  Administered 2019-01-01: 30 mg via INTRAVENOUS
  Administered 2019-01-01: 50 mg via INTRAVENOUS
  Administered 2019-01-01: 40 mg via INTRAVENOUS
  Administered 2019-01-01: 30 mg via INTRAVENOUS
  Administered 2019-01-01: 40 mg via INTRAVENOUS
  Administered 2019-01-01: 10 mg via INTRAVENOUS
  Administered 2019-01-01: 30 mg via INTRAVENOUS
  Administered 2019-01-01: 20 mg via INTRAVENOUS
  Administered 2019-01-01: 50 mg via INTRAVENOUS

## 2019-01-01 MED ORDER — LIDOCAINE HCL (PF) 2 % IJ SOLN
INTRAMUSCULAR | Status: AC
  Filled 2019-01-01: qty 20

## 2019-01-01 MED ORDER — PROPOFOL 500 MG/50ML IV EMUL
INTRAVENOUS | Status: AC
  Filled 2019-01-01: qty 100

## 2019-01-01 NOTE — Op Note (Signed)
[facility] Gastroenterology Patient Name: Maria Quinn Procedure Date: 01/01/2019 7:51 AM MRN: [id number] Account #: [id number] Date of Birth: 1980/09/13 Admit Type: Outpatient Age: 38 Room: [facility] ENDO ROOM 4 Gender: Female Note Status: Finalized Procedure:             Colonoscopy Indications:           Iron deficiency anemia Providers:             Bailyn Spackman B. Maximino Greenland MD, MD Medicines:             Monitored Anesthesia Care Complications:         No immediate complications. Procedure:             Pre-Anesthesia Assessment:                        - Prior to the procedure, a History and Physical was                         performed, and patient medications, allergies and                         sensitivities were reviewed. The patient's tolerance                         of previous anesthesia was reviewed.                        - The risks and benefits of the procedure and the                         sedation options and risks were discussed with the                         patient. All questions were answered and informed                         consent was obtained.                        - Patient identification and proposed procedure were                         verified prior to the procedure by the physician, the                         nurse, the anesthetist and the technician. The                         procedure was verified in the pre-procedure area in                         the procedure room in the endoscopy suite.                        - ASA Grade Assessment: II - A patient with mild                         systemic disease.                        -  After reviewing the risks and benefits, the patient                         was deemed in satisfactory condition to undergo the                         procedure.                        After obtaining informed consent, the colonoscope was                         passed under direct vision. Throughout  the procedure,                         the patient's blood pressure, pulse, and oxygen                         saturations were monitored continuously. The                         Colonoscope was introduced through the anus and                         advanced to the the cecum, identified by appendiceal                         orifice and ileocecal valve. The colonoscopy was                         performed with ease. The patient tolerated the                         procedure well. The quality of the bowel preparation                         was good. Findings:      The perianal and digital rectal examinations were normal.      Multiple diverticula were found in the sigmoid colon.      The exam was otherwise without abnormality.      The rectum, sigmoid colon, descending colon, transverse colon, ascending       colon and cecum appeared normal.      The retroflexed view of the distal rectum and anal verge was normal and       showed no anal or rectal abnormalities. Impression:            - Diverticulosis in the sigmoid colon.                        - The examination was otherwise normal.                        - The rectum, sigmoid colon, descending colon,                         transverse colon, ascending colon and cecum are normal.                        - The distal rectum and  anal verge are normal on                         retroflexion view.                        - No specimens collected. Recommendation:        - Discharge patient to home.                        - Resume previous diet.                        - Continue present medications.                        - Repeat colonoscopy in 10 years for screening                         purposes.                        - Return to primary care physician as previously                         scheduled.                        - The findings and recommendations were discussed with                         the patient.                         - The findings and recommendations were discussed with                         the patient's family.                        - High fiber diet. Procedure Code(s):     --- Professional ---                        [phone], Colonoscopy, flexible; diagnostic, including                         collection of specimen(s) by brushing or washing, when                         performed (separate procedure) Diagnosis Code(s):     --- Professional ---                        D50.9, Iron deficiency anemia, unspecified                        K57.30, Diverticulosis of large intestine without                         perforation or abscess without bleeding CPT copyright 2019 American Medical Association. All rights reserved. The codes documented in this report are preliminary and upon coder review may  be revised to meet current compliance  requirements.  Melodie Bouillon, MD Michel Bickers B. Maximino Greenland MD, MD 01/01/2019 8:51:47 AM This report has been signed electronically. Number of Addenda: 0 Note Initiated On: 01/01/2019 7:51 AM Scope Withdrawal Time: 0 hours 12 minutes 34 seconds  Total Procedure Duration: 0 hours 16 minutes 25 seconds       [facility]

## 2019-01-01 NOTE — Anesthesia Preprocedure Evaluation (Signed)
Anesthesia Evaluation  Patient identified by MRN, date of birth, ID band Patient awake    Reviewed: Allergy & Precautions, NPO status , Patient's Chart, lab work & pertinent test results, reviewed documented beta blocker date and time   Airway Mallampati: III  TM Distance: >3 FB     Dental  (+) Chipped   Pulmonary sleep apnea ,           Cardiovascular hypertension, Pt. on medications      Neuro/Psych  Headaches, PSYCHIATRIC DISORDERS Anxiety Depression    GI/Hepatic   Endo/Other    Renal/GU      Musculoskeletal  (+) Arthritis ,   Abdominal   Peds  Hematology  (+) anemia ,   Anesthesia Other Findings   Reproductive/Obstetrics                             Anesthesia Physical Anesthesia Plan  ASA: III  Anesthesia Plan: General   Post-op Pain Management:    Induction: Intravenous  PONV Risk Score and Plan:   Airway Management Planned:   Additional Equipment:   Intra-op Plan:   Post-operative Plan:   Informed Consent: I have reviewed the patients History and Physical, chart, labs and discussed the procedure including the risks, benefits and alternatives for the proposed anesthesia with the patient or authorized representative who has indicated his/her understanding and acceptance.       Plan Discussed with: CRNA  Anesthesia Plan Comments:         Anesthesia Quick Evaluation

## 2019-01-01 NOTE — H&P (Signed)
Melodie Bouillon, MD [address], [place], [place], [address] [address], [place], [place], [address] Phone: [phone]  Fax: [phone]  Primary Care Physician:  McLean-Scocuzza, Pasty Spillers, MD   Pre-Procedure History & Physical: HPI:  Maria Quinn is a 38 y.o. female is here for a colonoscopy and EGD.   Past Medical History:  Diagnosis Date  . Arthritis   . Chicken pox   . Depression   . Gastric bypass for obesity complicating pregnancy, birth, puerperium   . Hypertension   . OSA (obstructive sleep apnea)     Past Surgical History:  Procedure Laterality Date  . ABDOMINAL HYSTERECTOMY     Dr. Lang Snow 2012   . OOPHORECTOMY     38 y.o 1 ovary out per pt, later in life 2nd ovary out   . ROUX-EN-Y GASTRIC BYPASS     2010 losst down to 180s gained back   . TONSILLECTOMY     1987    Prior to Admission medications   Medication Sig Start Date End Date Taking? Authorizing Provider  amLODipine (NORVASC) 10 MG tablet Take 1 tablet (10 mg total) by mouth daily. 05/31/18  Yes McLean-Scocuzza, Pasty Spillers, MD  chlorthalidone (HYGROTON) 25 MG tablet Take 1 tablet (25 mg total) by mouth daily. 05/31/18  Yes McLean-Scocuzza, Pasty Spillers, MD  losartan (COZAAR) 50 MG tablet Take 1 tablet (50 mg total) by mouth daily. At night 05/31/18  Yes McLean-Scocuzza, Pasty Spillers, MD  spironolactone (ALDACTONE) 50 MG tablet Take 1 tablet (50 mg total) by mouth daily. 05/31/18  Yes McLean-Scocuzza, Pasty Spillers, MD  bisacodyl (BISACODYL) 5 MG EC tablet Take 2 tablets (10mg ) by mouth the day before your procedure between 1pm-3pm. 12/16/18   14/7/20, MD  Cholecalciferol (VITAMIN D) 50 MCG (2000 UT) CAPS Take by mouth daily.    [provider]  Coenzyme Q10 (COQ-10) 10 MG CAPS Take by mouth.    [provider]  lipase/protease/amylase (CREON) 36000 UNITS CPEP capsule Take 1 capsule (36,000 Units total) by mouth 3 (three) times daily with meals. And with snacks 12/26/18    12/28/18, MD  Multiple Vitamins-Minerals (CENTRUM WOMEN PO) Take by mouth.    [provider]  vitamin B-12 (CYANOCOBALAMIN) 1000 MCG tablet Take 1,000 mcg by mouth daily.    [provider]    Allergies as of 12/16/2018 - Review Complete 12/16/2018  Allergen Reaction Noted  . Penicillins Rash 07/15/2010    Family History  Problem Relation Age of Onset  . Depression Mother   . Diabetes Mother   . Hypertension Mother   . Miscarriages / 09/15/2010 Mother   . Cancer Father        prostate  . Diabetes Father   . Heart disease Father   . Hyperlipidemia Father   . Hypertension Father   . Kidney disease Father   . Stroke Father   . Depression Sister   . Alcohol abuse Brother   . Cancer Brother        lung   . Drug abuse Brother   . Arthritis Maternal Grandmother   . Diabetes Maternal Grandmother   . Hypertension Maternal Grandmother   . Stroke Maternal Grandmother   . Alcohol abuse Maternal Grandfather   . Stroke Paternal Grandmother   . Cancer Paternal Grandfather        ? type  . Alcohol abuse Brother   . Early death Brother     Social History  Socioeconomic History  . Marital status: Single    Spouse name: Not on file  . Number of children: Not on file  . Years of education: Not on file  . Highest education level: Not on file  Occupational History  . Not on file  Tobacco Use  . Smoking status: Never Smoker  . Smokeless tobacco: Never Used  Substance and Sexual Activity  . Alcohol use: Yes    Alcohol/week: 2.0 - 3.0 standard drinks    Types: 2 - 3 Glasses of wine per week  . Drug use: No  . Sexual activity: Yes    Comment: men  Other Topics Concern  . Not on file  Social History Narrative   Some college   Entertainer    Single    No kids    Wears seat belt, safe in relationship    Will start Job 09/16/2018 [employer] [facility] [place] [employer]    Social Determinants of Health   Financial Resource Strain:   . Difficulty of  Paying Living Expenses: Not on file  Food Insecurity:   . Worried About Charity fundraiser in the Last Year: Not on file  . Ran Out of Food in the Last Year: Not on file  Transportation Needs:   . Lack of Transportation (Medical): Not on file  . Lack of Transportation (Non-Medical): Not on file  Physical Activity:   . Days of Exercise per Week: Not on file  . Minutes of Exercise per Session: Not on file  Stress:   . Feeling of Stress : Not on file  Social Connections:   . Frequency of Communication with Friends and Family: Not on file  . Frequency of Social Gatherings with Friends and Family: Not on file  . Attends Religious Services: Not on file  . Active Member of Clubs or Organizations: Not on file  . Attends Archivist Meetings: Not on file  . Marital Status: Not on file  Intimate Partner Violence:   . Fear of Current or Ex-Partner: Not on file  . Emotionally Abused: Not on file  . Physically Abused: Not on file  . Sexually Abused: Not on file    Review of Systems: See HPI, otherwise negative ROS  Physical Exam: BP 131/89   Pulse 69   Temp (!) 96.9 F (36.1 C) (Temporal)   Resp 20   Ht 5\' 5"  (1.651 m)   Wt (!) 140.6 kg   LMP 10/19/2010 Comment: No periods   SpO2 100%   BMI 51.59 kg/m  General:   Alert,  pleasant and cooperative in NAD Head:  Normocephalic and atraumatic. Neck:  Supple; no masses or thyromegaly. Lungs:  Clear throughout to auscultation, normal respiratory effort.    Heart:  +S1, +S2, Regular rate and rhythm, No edema. Abdomen:  Soft, nontender and nondistended. Normal bowel sounds, without guarding, and without rebound.   Neurologic:  Alert and  oriented x4;  grossly normal neurologically.  Impression/Plan: Maria Quinn is here for a colonoscopy and EGD for iron def anemia.  Risks, benefits, limitations, and alternatives regarding the procedures have been reviewed with the patient.  Questions have been answered.  All parties  agreeable.   Virgel Manifold, MD  01/01/2019, 8:53 AM

## 2019-01-01 NOTE — Anesthesia Postprocedure Evaluation (Signed)
Anesthesia Post Note  Patient: Maria Quinn  Procedure(s) Performed: COLONOSCOPY WITH PROPOFOL (N/A ) ESOPHAGOGASTRODUODENOSCOPY (EGD) WITH PROPOFOL (N/A )  Patient location during evaluation: Endoscopy Anesthesia Type: General Level of consciousness: awake and alert Pain management: pain level controlled Vital Signs Assessment: post-procedure vital signs reviewed and stable Respiratory status: spontaneous breathing, nonlabored ventilation, respiratory function stable and patient connected to nasal cannula oxygen Cardiovascular status: blood pressure returned to baseline and stable Postop Assessment: no apparent nausea or vomiting Anesthetic complications: no     Last Vitals:  Vitals:   01/01/19 0855 01/01/19 0912  BP: 106/67 111/84  Pulse: 71   Resp: (!) 21   Temp:    SpO2: 100%     Last Pain:  Vitals:   01/01/19 0902  TempSrc:   PainSc: 0-No pain                 Ramiel Forti S

## 2019-01-01 NOTE — Transfer of Care (Signed)
Immediate Anesthesia Transfer of Care Note  Patient: Maria Quinn  Procedure(s) Performed: COLONOSCOPY WITH PROPOFOL (N/A ) ESOPHAGOGASTRODUODENOSCOPY (EGD) WITH PROPOFOL (N/A )  Patient Location: PACU  Anesthesia Type:General  Level of Consciousness: awake  Airway & Oxygen Therapy: Patient Spontanous Breathing  Post-op Assessment: Report given to RN and Post -op Vital signs reviewed and stable  Post vital signs: Reviewed and stable  Last Vitals:  Vitals Value Taken Time  BP 106/67 01/01/19 0855  Temp    Pulse 71 01/01/19 0855  Resp 21 01/01/19 0855  SpO2 100 % 01/01/19 0855    Last Pain:  Vitals:   01/01/19 0739  TempSrc: Temporal  PainSc: 3          Complications: No apparent anesthesia complications

## 2019-01-01 NOTE — Anesthesia Procedure Notes (Signed)
Date/Time: 01/01/2019 8:15 AM Performed by: Allean Found, CRNA Pre-anesthesia Checklist: Patient identified, Emergency Drugs available, Suction available and Patient being monitored Patient Re-evaluated:Patient Re-evaluated prior to induction Oxygen Delivery Method: Simple face mask

## 2019-01-01 NOTE — Op Note (Signed)
[facility] Gastroenterology Patient Name: Maria Quinn Procedure Date: 01/01/2019 7:55 AM MRN: [id number] Account #: [id number] Date of Birth: 1980/06/06 Admit Type: Outpatient Age: 38 Room: [facility] ENDO ROOM 4 Gender: Female Note Status: Finalized Procedure:             Upper GI endoscopy Indications:           Iron deficiency anemia Providers:             Curlie Sittner B. Maximino Greenland MD, MD Referring MD:          Pasty Spillers Mclean-Scocuzza MD, MD (Referring MD) Medicines:             Monitored Anesthesia Care Complications:         No immediate complications. Procedure:             Pre-Anesthesia Assessment:                        - Prior to the procedure, a History and Physical was                         performed, and patient medications, allergies and                         sensitivities were reviewed. The patient's tolerance                         of previous anesthesia was reviewed.                        - The risks and benefits of the procedure and the                         sedation options and risks were discussed with the                         patient. All questions were answered and informed                         consent was obtained.                        - Patient identification and proposed procedure were                         verified prior to the procedure by the physician, the                         nurse, the anesthesiologist, the anesthetist and the                         technician. The procedure was verified in the                         procedure room.                        - ASA Grade Assessment: II - A patient with mild  systemic disease.                        After obtaining informed consent, the endoscope was                         passed under direct vision. Throughout the procedure,                         the patient's blood pressure, pulse, and oxygen                         saturations were monitored  continuously. The Endoscope                         was introduced through the mouth, and advanced to the                         jejunum. The upper GI endoscopy was accomplished with                         ease. The patient tolerated the procedure well. Findings:      The examined esophagus was normal.      Evidence of a gastric bypass was found. A gastric pouch was found. The       staple line appeared intact. The gastrojejunal anastomosis was       characterized by healthy appearing mucosa. This was traversed. The       pouch-to-jejunum limb was characterized by healthy appearing mucosa.      The entire examined stomach was normal.      The examined jejunum was normal. Impression:            - Normal esophagus.                        - Gastric bypass with intact staple line.                         Gastrojejunal anastomosis characterized by healthy                         appearing mucosa.                        - Normal stomach.                        - Normal examined jejunum.                        - No specimens collected. Recommendation:        - Discharge patient to home (with escort).                        - Advance diet as tolerated.                        - Continue present medications.                        - Patient has a contact number available for  emergencies. The signs and symptoms of potential                         delayed complications were discussed with the patient.                         Return to normal activities tomorrow. Written                         discharge instructions were provided to the patient.                        - Discharge patient to home (with escort).                        - The findings and recommendations were discussed with                         the patient.                        - The findings and recommendations were discussed with                         the patient's family. Procedure Code(s):      --- Professional ---                        [phone], Esophagogastroduodenoscopy, flexible,                         transoral; diagnostic, including collection of                         specimen(s) by brushing or washing, when performed                         (separate procedure) Diagnosis Code(s):     --- Professional ---                        O96.29, Bariatric surgery status                        D50.9, Iron deficiency anemia, unspecified CPT copyright 2019 American Medical Association. All rights reserved. The codes documented in this report are preliminary and upon coder review may  be revised to meet current compliance requirements.  Melodie Bouillon, MD Michel Bickers B. Maximino Greenland MD, MD 01/01/2019 8:28:44 AM This report has been signed electronically. Number of Addenda: 0 Note Initiated On: 01/01/2019 7:55 AM Estimated Blood Loss:  Estimated blood loss: none.      [facility]

## 2019-01-01 NOTE — Anesthesia Post-op Follow-up Note (Signed)
Anesthesia QCDR form completed.        

## 2019-01-06 ENCOUNTER — Encounter: Payer: Self-pay | Admitting: *Deleted

## 2019-01-17 ENCOUNTER — Encounter: Payer: Self-pay | Admitting: Internal Medicine

## 2019-01-17 ENCOUNTER — Other Ambulatory Visit: Payer: Self-pay

## 2019-01-17 ENCOUNTER — Ambulatory Visit (INDEPENDENT_AMBULATORY_CARE_PROVIDER_SITE_OTHER): Payer: Self-pay | Admitting: Internal Medicine

## 2019-01-17 VITALS — BP 134/108 | Ht 65.0 in | Wt 310.0 lb

## 2019-01-17 DIAGNOSIS — Z1231 Encounter for screening mammogram for malignant neoplasm of breast: Secondary | ICD-10-CM

## 2019-01-17 DIAGNOSIS — M542 Cervicalgia: Secondary | ICD-10-CM | POA: Insufficient documentation

## 2019-01-17 DIAGNOSIS — I10 Essential (primary) hypertension: Secondary | ICD-10-CM

## 2019-01-17 DIAGNOSIS — M5440 Lumbago with sciatica, unspecified side: Secondary | ICD-10-CM

## 2019-01-17 DIAGNOSIS — G8929 Other chronic pain: Secondary | ICD-10-CM

## 2019-01-17 DIAGNOSIS — M545 Low back pain, unspecified: Secondary | ICD-10-CM | POA: Insufficient documentation

## 2019-01-17 DIAGNOSIS — K8681 Exocrine pancreatic insufficiency: Secondary | ICD-10-CM | POA: Insufficient documentation

## 2019-01-17 NOTE — Patient Instructions (Signed)

## 2019-01-17 NOTE — Progress Notes (Signed)
Virtual Visit via Video Note  I connected with Maria Quinn  on 01/17/19 at  8:05 AM EST by a video enabled telemedicine application and verified that I am speaking with the correct person using two identifiers.  Location patient: home Location provider:work or home office Persons participating in the virtual visit: patient, provider  I discussed the limitations of evaluation and management by telemedicine and the availability of in person appointments. The patient expressed understanding and agreed to proceed.   HPI: 1. HTN BP 134/108 has not taking meds today norvasc 10 mg, chlorthalidone 25, losartan 50, spironolactone 50 today until now 2. C/o shooting pain in low back and neck b/l 5/10 at times still having numbness in her right face but not as much as neck and back pain low and low back pain shoots down her legs at times. She thinks her breast size causes chronic low back pain  3. EPI s/p gastric bypass pt has not heard from app for creon nor started due to cost and no insurance   ROS: See pertinent positives and negatives per HPI.  MSK: neck and back pain   Past Medical History:  Diagnosis Date  . Arthritis   . Chicken pox   . Depression   . Gastric bypass for obesity complicating pregnancy, birth, puerperium   . Hypertension   . OSA (obstructive sleep apnea)     Past Surgical History:  Procedure Laterality Date  . ABDOMINAL HYSTERECTOMY     Dr. Lang Snow 2012   . COLONOSCOPY WITH PROPOFOL N/A 01/01/2019   Procedure: COLONOSCOPY WITH PROPOFOL;  Surgeon: Pasty Spillers, MD;  Location: [facility] ENDOSCOPY;  Service: Endoscopy;  Laterality: N/A;  . ESOPHAGOGASTRODUODENOSCOPY (EGD) WITH PROPOFOL N/A 01/01/2019   Procedure: ESOPHAGOGASTRODUODENOSCOPY (EGD) WITH PROPOFOL;  Surgeon: Pasty Spillers, MD;  Location: [facility] ENDOSCOPY;  Service: Endoscopy;  Laterality: N/A;  . OOPHORECTOMY     39 y.o 1 ovary out per pt, later in life 2nd ovary out   . ROUX-EN-Y GASTRIC BYPASS     2010 losst down to 180s gained back   . TONSILLECTOMY     1987    Family History  Problem Relation Age of Onset  . Depression Mother   . Diabetes Mother   . Hypertension Mother   . Miscarriages / [place] Mother   . Cancer Father        prostate  . Diabetes Father   . Heart disease Father   . Hyperlipidemia Father   . Hypertension Father   . Kidney disease Father   . Stroke Father   . Depression Sister   . Alcohol abuse Brother   . Cancer Brother        lung   . Drug abuse Brother   . Arthritis Maternal Grandmother   . Diabetes Maternal Grandmother   . Hypertension Maternal Grandmother   . Stroke Maternal Grandmother   . Alcohol abuse Maternal Grandfather   . Stroke Paternal Grandmother   . Cancer Paternal Grandfather        ? type  . Alcohol abuse Brother   . Early death Brother     SOCIAL HX:  Some college Entertainer  Single  No kids  Wears seat belt, safe in relationship     Current Outpatient Medications:  .  amLODipine (NORVASC) 10 MG tablet, Take 1 tablet (10 mg total) by mouth daily., Disp: 30 tablet, Rfl: 11 .  bisacodyl (BISACODYL) 5 MG EC tablet, Take 2 tablets (10mg ) by mouth the day  before your procedure between 1pm-3pm., Disp: 2 tablet, Rfl: 0 .  chlorthalidone (HYGROTON) 25 MG tablet, Take 1 tablet (25 mg total) by mouth daily., Disp: 30 tablet, Rfl: 11 .  Cholecalciferol (VITAMIN D) 50 MCG (2000 UT) CAPS, Take by mouth daily., Disp: , Rfl:  .  Coenzyme Q10 (COQ-10) 10 MG CAPS, Take by mouth., Disp: , Rfl:  .  lipase/protease/amylase (CREON) 36000 UNITS CPEP capsule, Take 1 capsule (36,000 Units total) by mouth 3 (three) times daily with meals. And with snacks, Disp: 150 capsule, Rfl: 2 .  losartan (COZAAR) 50 MG tablet, Take 1 tablet (50 mg total) by mouth daily. At night, Disp: 90 tablet, Rfl: 3 .  Multiple Vitamins-Minerals (CENTRUM WOMEN PO), Take by mouth., Disp: , Rfl:  .  spironolactone (ALDACTONE) 50 MG tablet, Take 1 tablet (50 mg  total) by mouth daily., Disp: 30 tablet, Rfl: 11 .  vitamin B-12 (CYANOCOBALAMIN) 1000 MCG tablet, Take 1,000 mcg by mouth daily., Disp: , Rfl:   EXAM:  VITALS per patient if applicable:  GENERAL: alert, oriented, appears well and in no acute distress  HEENT: atraumatic, conjunttiva clear, no obvious abnormalities on inspection of external nose and ears  NECK: normal movements of the head and neck  LUNGS: on inspection no signs of respiratory distress, breathing rate appears normal, no obvious gross SOB, gasping or wheezing  CV: no obvious cyanosis  MS: moves all visible extremities without noticeable abnormality  PSYCH/NEURO: pleasant and cooperative, no obvious depression or anxiety, speech and thought processing grossly intact  ASSESSMENT AND PLAN:  Discussed the following assessment and plan:  Essential hypertension -cont meds  Monitor BP and call back of my chart later today with reading after meds  Normally BP controlled but no meds today   Cervicalgia Chronic bilateral low back pain with sciatica, sciatica laterality unspecified Consider Xray neck and low back  Avoid nsaids  rec Tylenol prn and back stretches  Disc lidocaine, salonpas otc   EPI s/p gastric bypass  -sent Dr. Karie Schwalbe GI a message pt has not heard about app nor started creon 2/2 cost  HM Declines flu shot  Tdap given infoprev no insurance currently Check hep B statusin future currently pt w/o insurance Check vitamin D in future and B12 when gets insurance call back as of 01/17/19   HIV neg 10/22/10 declines STD check  LMP 2012 s/p hysterectomy both ovaries removed previously -CT ab/pelvis 10/19/10 c/w complex cystic mass in uterus with elevated CA 125 -per path report removal uterus, b/l fallopian tubes and cervix neg malignancy or dysplasia 10/2010 Dr. Lang Snow   Former smoker  rec take Mvt with iron  mammo due 01/15/2019 disc at [facility] when gets insurance or they have $99 program w/o  insurance   Egd/colonoscopy 01/01/19 diverticulosis, EGD no bxs f/u in 10 years colonos. She also has   -we discussed possible serious and likely etiologies, options for evaluation and workup, limitations of telemedicine visit vs in person visit, treatment, treatment risks and precautions. Pt prefers to treat via telemedicine empirically rather then risking or undertaking an in person visit at this moment. Patient agrees to seek prompt in person care if worsening, new symptoms arise, or if is not improving with treatment.   I discussed the assessment and treatment plan with the patient. The patient was provided an opportunity to ask questions and all were answered. The patient agreed with the plan and demonstrated an understanding of the instructions.   The patient was advised to call  back or seek an in-person evaluation if the symptoms worsen or if the condition fails to improve as anticipated.  Time 10-19 minutes  Delorise Jackson, MD

## 2019-01-20 ENCOUNTER — Telehealth: Payer: Self-pay

## 2019-01-20 NOTE — Telephone Encounter (Signed)
-----   Message from Pasty Spillers, MD sent at 01/20/2019 10:33 AM EST ----- Morrie Sheldon, can you look into what happened with her creon? ----- Message ----- From: McLean-Scocuzza, Pasty Spillers, MD Sent: 01/17/2019   8:33 AM EST To: Pasty Spillers, MD  Do you all have samples of creon?  Pt never heard about application for creon   Thanks TMS

## 2019-01-20 NOTE — Telephone Encounter (Signed)
They state that patient talk to nurse this afternoon and they will be working on getting the next steps going

## 2019-01-20 NOTE — Telephone Encounter (Signed)
Called and left a message at [phone] with the creon assistance program for a call back to find out the status of application

## 2019-01-27 ENCOUNTER — Telehealth: Payer: Self-pay | Admitting: Internal Medicine

## 2019-01-27 NOTE — Telephone Encounter (Signed)
Looked at access nurse note and she advised the pt to go to [facility] or ED. Per note pt verbalized understanding.

## 2019-01-27 NOTE — Telephone Encounter (Signed)
Patient called about her feet being swollen for almost a week. Swelling has become uncomfortable in the past two days. Patient stated she has had this issue before and is on BP medication. No appts available with any other provider until Thursday.

## 2019-01-27 NOTE — Telephone Encounter (Signed)
Patient hung up before being transferred, Access Nurse operator said she would have a nurse call patient.

## 2019-01-27 NOTE — Telephone Encounter (Signed)
LMTCB

## 2019-01-27 NOTE — Telephone Encounter (Signed)
LMTCB. Per Morrie Sheldon the Access Nurse called back and stated that the pt would need to be seen in 1-4 hours.

## 2019-01-27 NOTE — Telephone Encounter (Signed)
Please see notes below this one.

## 2019-02-25 LAB — HM MAMMOGRAPHY

## 2019-03-06 ENCOUNTER — Encounter: Payer: Self-pay | Admitting: Internal Medicine

## 2019-04-16 ENCOUNTER — Telehealth: Payer: Self-pay | Admitting: Gastroenterology

## 2019-04-16 NOTE — Telephone Encounter (Signed)
Patient called & is having trouble getting her medication from [employer] (?sp) She does not know the name of the medication just that it cost $600.00 a month. Please call patient

## 2019-04-17 ENCOUNTER — Other Ambulatory Visit: Payer: Self-pay | Admitting: Gastroenterology

## 2019-04-17 MED ORDER — ZENPEP 40000-126000 UNITS PO CPEP: 70000.0000 [IU] | ORAL_CAPSULE | Freq: Three times a day (TID) | ORAL | 0 refills | Status: AC

## 2019-04-17 NOTE — Telephone Encounter (Signed)
Patient Creon is 600 dollars a month. Please advised if anything else can be called in for patient.

## 2019-04-17 NOTE — Telephone Encounter (Signed)
Creon rep stated that the Maria Quinn is there assistance program and that the only assistance they have.

## 2019-04-21 ENCOUNTER — Ambulatory Visit: Payer: Self-pay | Attending: Internal Medicine

## 2019-04-21 DIAGNOSIS — Z23 Encounter for immunization: Secondary | ICD-10-CM

## 2019-04-21 NOTE — Progress Notes (Signed)
Covid-19 Vaccination Clinic  Name:  Maria Quinn    MRN: [id number] DOB: [birth year]  04/21/2019  [name] was observed post Covid-19 immunization for 15 minutes without incident. She was provided with Vaccine Information Sheet and instruction to access the V-Safe system.   [name] was instructed to call 911 with any severe reactions post vaccine: Marland Kitchen Difficulty breathing  . Swelling of face and throat  . A fast heartbeat  . A bad rash all over body  . Dizziness and weakness   Immunizations Administered    Name Date Dose VIS Date Route   Pfizer COVID-19 Vaccine 04/21/2019  1:39 PM 0.3 mL 12/20/2018 Intramuscular   Manufacturer: [employer], [employer]   Lot: [id number]   NDC: [address]-4

## 2019-04-30 ENCOUNTER — Telehealth (INDEPENDENT_AMBULATORY_CARE_PROVIDER_SITE_OTHER): Payer: Self-pay | Admitting: Internal Medicine

## 2019-04-30 ENCOUNTER — Encounter: Payer: Self-pay | Admitting: Internal Medicine

## 2019-04-30 VITALS — Ht 65.0 in | Wt 306.0 lb

## 2019-04-30 DIAGNOSIS — E538 Deficiency of other specified B group vitamins: Secondary | ICD-10-CM

## 2019-04-30 DIAGNOSIS — Z1329 Encounter for screening for other suspected endocrine disorder: Secondary | ICD-10-CM

## 2019-04-30 DIAGNOSIS — E559 Vitamin D deficiency, unspecified: Secondary | ICD-10-CM

## 2019-04-30 DIAGNOSIS — E611 Iron deficiency: Secondary | ICD-10-CM

## 2019-04-30 DIAGNOSIS — Z1389 Encounter for screening for other disorder: Secondary | ICD-10-CM

## 2019-04-30 DIAGNOSIS — R7303 Prediabetes: Secondary | ICD-10-CM

## 2019-04-30 DIAGNOSIS — I10 Essential (primary) hypertension: Secondary | ICD-10-CM

## 2019-04-30 DIAGNOSIS — R0982 Postnasal drip: Secondary | ICD-10-CM

## 2019-04-30 MED ORDER — CHLORTHALIDONE 25 MG PO TABS: 25.0000 mg | ORAL_TABLET | Freq: Every day | ORAL | 11 refills | Status: DC

## 2019-04-30 MED ORDER — SALINE SPRAY 0.65 % NA SOLN: 1.0000 | NASAL | 11 refills | Status: DC | PRN

## 2019-04-30 MED ORDER — SPIRONOLACTONE 50 MG PO TABS: 50.0000 mg | ORAL_TABLET | Freq: Every day | ORAL | 11 refills | Status: DC

## 2019-04-30 MED ORDER — AMLODIPINE BESYLATE 10 MG PO TABS: 10.0000 mg | ORAL_TABLET | Freq: Every day | ORAL | 11 refills | Status: DC

## 2019-04-30 MED ORDER — CETIRIZINE HCL 10 MG PO TABS: 10.0000 mg | ORAL_TABLET | Freq: Every day | ORAL | 11 refills | Status: DC

## 2019-04-30 MED ORDER — LOSARTAN POTASSIUM 50 MG PO TABS: 50.0000 mg | ORAL_TABLET | Freq: Every day | ORAL | 3 refills | Status: DC

## 2019-04-30 NOTE — Progress Notes (Signed)
Telephone Note  I connected with Maria Quinn  on 04/30/19 at  8:10 AM EDT by a telephone and verified that I am speaking with the correct person using two identifiers.  Location patient: home Location provider:work or home office Persons participating in the virtual visit: patient, provider  I discussed the limitations of evaluation and management by telemedicine and the availability of in person appointments. The patient expressed understanding and agreed to proceed.   HPI: 1. HTN on norvasc 10, chlorthalidone 25, losartan 50, spironolactone 50 not checked BP  2 s/p gastric bypass with prediabetes  3. C/o PND throat clearing tried zyrtec in the past and helped    ROS: See pertinent positives and negatives per HPI.  Past Medical History:  Diagnosis Date  . Arthritis   . Chicken pox   . Depression   . Gastric bypass for obesity complicating pregnancy, birth, puerperium   . Hypertension   . OSA (obstructive sleep apnea)     Past Surgical History:  Procedure Laterality Date  . ABDOMINAL HYSTERECTOMY     Dr. Daiva Huge 2012   . COLONOSCOPY WITH PROPOFOL N/A 01/01/2019   Procedure: COLONOSCOPY WITH PROPOFOL;  Surgeon: Virgel Manifold, MD;  Location: [facility] ENDOSCOPY;  Service: Endoscopy;  Laterality: N/A;  . ESOPHAGOGASTRODUODENOSCOPY (EGD) WITH PROPOFOL N/A 01/01/2019   Procedure: ESOPHAGOGASTRODUODENOSCOPY (EGD) WITH PROPOFOL;  Surgeon: Virgel Manifold, MD;  Location: [facility] ENDOSCOPY;  Service: Endoscopy;  Laterality: N/A;  . OOPHORECTOMY     39 y.o 1 ovary out per pt, later in life 2nd ovary out   . ROUX-EN-Y GASTRIC BYPASS     2010 losst down to 180s gained back   . TONSILLECTOMY     1987    Family History  Problem Relation Age of Onset  . Depression Mother   . Diabetes Mother   . Hypertension Mother   . Miscarriages / [place] Mother   . Cancer Father        prostate  . Diabetes Father   . Heart disease Father   . Hyperlipidemia Father   . Hypertension  Father   . Kidney disease Father   . Stroke Father   . Depression Sister   . Alcohol abuse Brother   . Cancer Brother        lung   . Drug abuse Brother   . Arthritis Maternal Grandmother   . Diabetes Maternal Grandmother   . Hypertension Maternal Grandmother   . Stroke Maternal Grandmother   . Alcohol abuse Maternal Grandfather   . Stroke Paternal Grandmother   . Cancer Paternal Grandfather        ? type  . Alcohol abuse Brother   . Early death Brother     SOCIAL HX: lives with parents   Current Outpatient Medications:  .  amLODipine (NORVASC) 10 MG tablet, Take 1 tablet (10 mg total) by mouth daily., Disp: 30 tablet, Rfl: 11 .  Ascorbic Acid (VITAMIN C PO), Take 1,000 mg by mouth daily., Disp: , Rfl:  .  Cholecalciferol (VITAMIN D) 50 MCG (2000 UT) CAPS, Take by mouth daily., Disp: , Rfl:  .  losartan (COZAAR) 50 MG tablet, Take 1 tablet (50 mg total) by mouth daily. At night, Disp: 90 tablet, Rfl: 3 .  Multiple Vitamins-Minerals (CENTRUM WOMEN PO), Take by mouth., Disp: , Rfl:  .  spironolactone (ALDACTONE) 50 MG tablet, Take 1 tablet (50 mg total) by mouth daily., Disp: 30 tablet, Rfl: 11 .  vitamin B-12 (CYANOCOBALAMIN) 1000 MCG tablet,  Take 1,000 mcg by mouth daily., Disp: , Rfl:  .  cetirizine (ZYRTEC) 10 MG tablet, Take 1 tablet (10 mg total) by mouth daily., Disp: 30 tablet, Rfl: 11 .  chlorthalidone (HYGROTON) 25 MG tablet, Take 1 tablet (25 mg total) by mouth daily., Disp: 30 tablet, Rfl: 11 .  Pancrelipase, Lip-Prot-Amyl, (ZENPEP) 40000-126000 units CPEP, Take 70,000 units of lipase by mouth 3 (three) times daily with meals. (Patient not taking: Reported on 04/30/2019), Disp: 90 capsule, Rfl: 0 .  sodium chloride (OCEAN) 0.65 % SOLN nasal spray, Place 1 spray into both nostrils as needed for congestion., Disp: 30 mL, Rfl: 11  EXAM:  VITALS per patient if applicable:  GENERAL: alert, oriented, appears well and in no acute distress  PSYCH/NEURO: pleasant and  cooperative, no obvious depression or anxiety, speech and thought processing grossly intact  ASSESSMENT AND PLAN:  Discussed the following assessment and plan:  Essential hypertension - Plan: amLODipine (NORVASC) 10 MG tablet, losartan (COZAAR) 50 MG tablet, spironolactone (ALDACTONE) 50 MG tablet, chlorthalidone (HYGROTON) 25 MG tablet, Comprehensive metabolic panel, Lipid panel, CBC with Differential/Platelet Fasting labs when able currently w/o insurance  PND (post-nasal drip) - Plan: cetirizine (ZYRTEC) 10 MG tablet, sodium chloride (OCEAN) 0.65 % SOLN nasal spray  Prediabetes - Plan: Hemoglobin A1c  HM Declines flu shot  covid 1/2 had  Tdap given infoprev no insurance currently Check hep B statusin future currently pt w/o insurance Check vitamin D in future and B12 when gets insurance call back as of 01/17/19   HIV neg 10/22/10 declines STD check  LMP 2012 s/p hysterectomy both ovaries removed previously -CT ab/pelvis 10/19/10 c/w complex cystic mass in uterus with elevated CA 125 -per path report removal uterus, b/l fallopian tubes and cervix neg malignancy or dysplasia 10/2010 Dr. Lang Snow   Former smoker  rec take Mvt with iron  mammo due 02/25/2019 [facility] negative   Egd/colonoscopy 01/01/19 diverticulosis, EGD no bxs f/u in 10 years colonos. She also has  -we discussed possible serious and likely etiologies, options for evaluation and workup, limitations of telemedicine visit vs in person visit, treatment, treatment risks and precautions. Pt prefers to treat via telemedicine empirically rather then risking or undertaking an in person visit at this moment. Patient agrees to seek prompt in person care if worsening, new symptoms arise, or if is not improving with treatment.   I discussed the assessment and treatment plan with the patient. The patient was provided an opportunity to ask questions and all were answered. The patient agreed with the plan and demonstrated an  understanding of the instructions.   The patient was advised to call back or seek an in-person evaluation if the symptoms worsen or if the condition fails to improve as anticipated.  Time spent 20 minutes Bevelyn Buckles, MD

## 2019-05-14 ENCOUNTER — Ambulatory Visit: Payer: Self-pay | Attending: Internal Medicine

## 2019-05-14 DIAGNOSIS — Z23 Encounter for immunization: Secondary | ICD-10-CM

## 2019-05-14 NOTE — Progress Notes (Signed)
Covid-19 Vaccination Clinic  Name:  LATICIA SMIEJA    MRN: [id number] DOB: 25-Sep-1980  05/14/2019  Ms. Trickey was observed post Covid-19 immunization for 15 minutes without incident. She was provided with Vaccine Information Sheet and instruction to access the V-Safe system.   Ms. Kaplowitz was instructed to call 911 with any severe reactions post vaccine: Marland Kitchen Difficulty breathing  . Swelling of face and throat  . A fast heartbeat  . A bad rash all over body  . Dizziness and weakness   Immunizations Administered    Name Date Dose VIS Date Route   Pfizer COVID-19 Vaccine 05/14/2019  8:39 AM 0.3 mL 03/05/2018 Intramuscular   Manufacturer: [employer], [employer]   Lot: [id number]   NDC: [address]-0

## 2019-06-05 ENCOUNTER — Telehealth: Payer: Self-pay | Admitting: Internal Medicine

## 2019-06-05 DIAGNOSIS — I10 Essential (primary) hypertension: Secondary | ICD-10-CM

## 2019-06-05 MED ORDER — SPIRONOLACTONE 50 MG PO TABS: 50.0000 mg | ORAL_TABLET | Freq: Every day | ORAL | 11 refills | Status: DC

## 2019-06-05 MED ORDER — CHLORTHALIDONE 25 MG PO TABS: 25.0000 mg | ORAL_TABLET | Freq: Every day | ORAL | 11 refills | Status: DC

## 2019-06-05 MED ORDER — AMLODIPINE BESYLATE 10 MG PO TABS: 10.0000 mg | ORAL_TABLET | Freq: Every day | ORAL | 11 refills | Status: DC

## 2019-06-05 MED ORDER — LOSARTAN POTASSIUM 50 MG PO TABS: 50.0000 mg | ORAL_TABLET | Freq: Every day | ORAL | 3 refills | Status: DC

## 2019-06-05 NOTE — Telephone Encounter (Signed)
Pt needs a refill on her 3 BP medications sent to [facility]

## 2019-08-18 ENCOUNTER — Telehealth: Payer: Self-pay | Admitting: Internal Medicine

## 2019-08-18 DIAGNOSIS — I10 Essential (primary) hypertension: Secondary | ICD-10-CM

## 2019-08-18 MED ORDER — LOSARTAN POTASSIUM 50 MG PO TABS: 50.0000 mg | ORAL_TABLET | Freq: Every day | ORAL | 3 refills | Status: DC

## 2019-08-18 NOTE — Telephone Encounter (Signed)
Pt called in need refill on losartan (COZAAR) 50 MG tablet

## 2019-10-31 ENCOUNTER — Other Ambulatory Visit: Payer: Self-pay

## 2019-10-31 ENCOUNTER — Ambulatory Visit (INDEPENDENT_AMBULATORY_CARE_PROVIDER_SITE_OTHER): Payer: Commercial Managed Care - PPO

## 2019-10-31 ENCOUNTER — Encounter: Payer: Self-pay | Admitting: Internal Medicine

## 2019-10-31 ENCOUNTER — Ambulatory Visit: Payer: Commercial Managed Care - PPO | Admitting: Internal Medicine

## 2019-10-31 VITALS — BP 126/78 | HR 81 | Temp 98.5°F | Ht 65.0 in | Wt 330.0 lb

## 2019-10-31 DIAGNOSIS — Z1329 Encounter for screening for other suspected endocrine disorder: Secondary | ICD-10-CM

## 2019-10-31 DIAGNOSIS — E559 Vitamin D deficiency, unspecified: Secondary | ICD-10-CM

## 2019-10-31 DIAGNOSIS — E538 Deficiency of other specified B group vitamins: Secondary | ICD-10-CM

## 2019-10-31 DIAGNOSIS — Z0000 Encounter for general adult medical examination without abnormal findings: Secondary | ICD-10-CM | POA: Diagnosis not present

## 2019-10-31 DIAGNOSIS — M25562 Pain in left knee: Secondary | ICD-10-CM

## 2019-10-31 DIAGNOSIS — R7303 Prediabetes: Secondary | ICD-10-CM

## 2019-10-31 DIAGNOSIS — Z1231 Encounter for screening mammogram for malignant neoplasm of breast: Secondary | ICD-10-CM

## 2019-10-31 DIAGNOSIS — G8929 Other chronic pain: Secondary | ICD-10-CM

## 2019-10-31 DIAGNOSIS — I10 Essential (primary) hypertension: Secondary | ICD-10-CM

## 2019-10-31 DIAGNOSIS — Z1389 Encounter for screening for other disorder: Secondary | ICD-10-CM

## 2019-10-31 DIAGNOSIS — Z0184 Encounter for antibody response examination: Secondary | ICD-10-CM

## 2019-10-31 MED ORDER — CHLORTHALIDONE 25 MG PO TABS: 25.0000 mg | ORAL_TABLET | Freq: Every day | ORAL | 3 refills | Status: DC

## 2019-10-31 MED ORDER — SPIRONOLACTONE 50 MG PO TABS: 50.0000 mg | ORAL_TABLET | Freq: Every day | ORAL | 3 refills | Status: DC

## 2019-10-31 MED ORDER — LOSARTAN POTASSIUM 50 MG PO TABS: 50.0000 mg | ORAL_TABLET | Freq: Every day | ORAL | 3 refills | Status: DC

## 2019-10-31 MED ORDER — AMLODIPINE BESYLATE 10 MG PO TABS: 10.0000 mg | ORAL_TABLET | Freq: Every day | ORAL | 3 refills | Status: DC

## 2019-10-31 NOTE — Patient Instructions (Addendum)
Call back Tdap or [facility]  Tylenol and Voltaren gel  55 -64 ounces water daily   Warm water, hydrogen peroxide, colace liquid for ear in ear flush gently flush 1x per month   Knee Exercises Ask your health care provider which exercises are safe for you. Do exercises exactly as told by your health care provider and adjust them as directed. It is normal to feel mild stretching, pulling, tightness, or discomfort as you do these exercises. Stop right away if you feel sudden pain or your pain gets worse. Do not begin these exercises until told by your health care provider. Stretching and range-of-motion exercises These exercises warm up your muscles and joints and improve the movement and flexibility of your knee. These exercises also help to relieve pain and swelling. Knee extension, prone 1. Lie on your abdomen (prone position) on a bed. 2. Place your left / right knee just beyond the edge of the surface so your knee is not on the bed. You can put a towel under your left / right thigh just above your kneecap for comfort. 3. Relax your leg muscles and allow gravity to straighten your knee (extension). You should feel a stretch behind your left / right knee. 4. Hold this position for __________ seconds. 5. Scoot up so your knee is supported between repetitions. Repeat __________ times. Complete this exercise __________ times a day. Knee flexion, active  1. Lie on your back with both legs straight. If this causes back discomfort, bend your left / right knee so your foot is flat on the floor. 2. Slowly slide your left / right heel back toward your buttocks. Stop when you feel a gentle stretch in the front of your knee or thigh (flexion). 3. Hold this position for __________ seconds. 4. Slowly slide your left / right heel back to the starting position. Repeat __________ times. Complete this exercise __________ times a day. Quadriceps stretch, prone  1. Lie on your abdomen on a firm surface, such  as a bed or padded floor. 2. Bend your left / right knee and hold your ankle. If you cannot reach your ankle or pant leg, loop a belt around your foot and grab the belt instead. 3. Gently pull your heel toward your buttocks. Your knee should not slide out to the side. You should feel a stretch in the front of your thigh and knee (quadriceps). 4. Hold this position for __________ seconds. Repeat __________ times. Complete this exercise __________ times a day. Hamstring, supine 1. Lie on your back (supine position). 2. Loop a belt or towel over the ball of your left / right foot. The ball of your foot is on the walking surface, right under your toes. 3. Straighten your left / right knee and slowly pull on the belt to raise your leg until you feel a gentle stretch behind your knee (hamstring). ? Do not let your knee bend while you do this. ? Keep your other leg flat on the floor. 4. Hold this position for __________ seconds. Repeat __________ times. Complete this exercise __________ times a day. Strengthening exercises These exercises build strength and endurance in your knee. Endurance is the ability to use your muscles for a long time, even after they get tired. Quadriceps, isometric This exercise stretches the muscles in front of your thigh (quadriceps) without moving your knee joint (isometric). 1. Lie on your back with your left / right leg extended and your other knee bent. Put a rolled towel or small  pillow under your knee if told by your health care provider. 2. Slowly tense the muscles in the front of your left / right thigh. You should see your kneecap slide up toward your hip or see increased dimpling just above the knee. This motion will push the back of the knee toward the floor. 3. For __________ seconds, hold the muscle as tight as you can without increasing your pain. 4. Relax the muscles slowly and completely. Repeat __________ times. Complete this exercise __________ times a  day. Straight leg raises This exercise stretches the muscles in front of your thigh (quadriceps) and the muscles that move your hips (hip flexors). 1. Lie on your back with your left / right leg extended and your other knee bent. 2. Tense the muscles in the front of your left / right thigh. You should see your kneecap slide up or see increased dimpling just above the knee. Your thigh may even shake a bit. 3. Keep these muscles tight as you raise your leg 4-6 inches (10-15 cm) off the floor. Do not let your knee bend. 4. Hold this position for __________ seconds. 5. Keep these muscles tense as you lower your leg. 6. Relax your muscles slowly and completely after each repetition. Repeat __________ times. Complete this exercise __________ times a day. Hamstring, isometric 1. Lie on your back on a firm surface. 2. Bend your left / right knee about __________ degrees. 3. Dig your left / right heel into the surface as if you are trying to pull it toward your buttocks. Tighten the muscles in the back of your thighs (hamstring) to "dig" as hard as you can without increasing any pain. 4. Hold this position for __________ seconds. 5. Release the tension gradually and allow your muscles to relax completely for __________ seconds after each repetition. Repeat __________ times. Complete this exercise __________ times a day. Hamstring curls If told by your health care provider, do this exercise while wearing ankle weights. Begin with __________ lb weights. Then increase the weight by 1 lb (0.5 kg) increments. Do not wear ankle weights that are more than __________ lb. 1. Lie on your abdomen with your legs straight. 2. Bend your left / right knee as far as you can without feeling pain. Keep your hips flat against the floor. 3. Hold this position for __________ seconds. 4. Slowly lower your leg to the starting position. Repeat __________ times. Complete this exercise __________ times a day. Squats This  exercise strengthens the muscles in front of your thigh and knee (quadriceps). 1. Stand in front of a table, with your feet and knees pointing straight ahead. You may rest your hands on the table for balance but not for support. 2. Slowly bend your knees and lower your hips like you are going to sit in a chair. ? Keep your weight over your heels, not over your toes. ? Keep your lower legs upright so they are parallel with the table legs. ? Do not let your hips go lower than your knees. ? Do not bend lower than told by your health care provider. ? If your knee pain increases, do not bend as low. 3. Hold the squat position for __________ seconds. 4. Slowly push with your legs to return to standing. Do not use your hands to pull yourself to standing. Repeat __________ times. Complete this exercise __________ times a day. Wall slides This exercise strengthens the muscles in front of your thigh and knee (quadriceps). 1. Lean your back against  a smooth wall or door, and walk your feet out 18-24 inches (46-61 cm) from it. 2. Place your feet hip-width apart. 3. Slowly slide down the wall or door until your knees bend __________ degrees. Keep your knees over your heels, not over your toes. Keep your knees in line with your hips. 4. Hold this position for __________ seconds. Repeat __________ times. Complete this exercise __________ times a day. Straight leg raises This exercise strengthens the muscles that rotate the leg at the hip and move it away from your body (hip abductors). 1. Lie on your side with your left / right leg in the top position. Lie so your head, shoulder, knee, and hip line up. You may bend your bottom knee to help you keep your balance. 2. Roll your hips slightly forward so your hips are stacked directly over each other and your left / right knee is facing forward. 3. Leading with your heel, lift your top leg 4-6 inches (10-15 cm). You should feel the muscles in your outer hip  lifting. ? Do not let your foot drift forward. ? Do not let your knee roll toward the ceiling. 4. Hold this position for __________ seconds. 5. Slowly return your leg to the starting position. 6. Let your muscles relax completely after each repetition. Repeat __________ times. Complete this exercise __________ times a day. Straight leg raises This exercise stretches the muscles that move your hips away from the front of the pelvis (hip extensors). 1. Lie on your abdomen on a firm surface. You can put a pillow under your hips if that is more comfortable. 2. Tense the muscles in your buttocks and lift your left / right leg about 4-6 inches (10-15 cm). Keep your knee straight as you lift your leg. 3. Hold this position for __________ seconds. 4. Slowly lower your leg to the starting position. 5. Let your leg relax completely after each repetition. Repeat __________ times. Complete this exercise __________ times a day. This information is not intended to replace advice given to you by your health care provider. Make sure you discuss any questions you have with your health care provider. Document Revised: 10/16/2017 Document Reviewed: 10/16/2017 Elsevier Patient Education  2020 [employer].

## 2019-10-31 NOTE — Progress Notes (Signed)
Chief Complaint  Patient presents with  . Follow-up  . Knee Pain   Annual  1. HTN controlled on norvasc 10 mg qd, chlorathalidone 25 mg qd, losartan 50 mg qd and spironolactone 5 0mg  qd  2. B/l knee pain L>R 5/10 today chronic knee pain since child nothing tried and 09/2019 had cramps in thighs and calves and bought otc K  Knee pain worse x 2 months    Review of Systems  Constitutional: Negative for weight loss.  HENT: Negative for hearing loss.   Eyes: Negative for blurred vision.  Respiratory: Negative for shortness of breath.   Cardiovascular: Negative for chest pain.  Gastrointestinal: Negative for abdominal pain.  Musculoskeletal: Positive for joint pain.  Skin: Negative for rash.  Neurological: Negative for headaches.  Psychiatric/Behavioral: Negative for depression.   Past Medical History:  Diagnosis Date  . Arthritis   . Chicken pox   . Depression   . Gastric bypass for obesity complicating pregnancy, birth, puerperium   . Hypertension   . OSA (obstructive sleep apnea)    Past Surgical History:  Procedure Laterality Date  . ABDOMINAL HYSTERECTOMY     Dr. 10/2019 2012   . COLONOSCOPY WITH PROPOFOL N/A 01/01/2019   Procedure: COLONOSCOPY WITH PROPOFOL;  Surgeon: 01/03/2019, MD;  Location: [facility];  Service: Endoscopy;  Laterality: N/A;  . ESOPHAGOGASTRODUODENOSCOPY (EGD) WITH PROPOFOL N/A 01/01/2019   Procedure: ESOPHAGOGASTRODUODENOSCOPY (EGD) WITH PROPOFOL;  Surgeon: 01/03/2019, MD;  Location: [facility];  Service: Endoscopy;  Laterality: N/A;  . OOPHORECTOMY     39 y.o 1 ovary out per pt, later in life 2nd ovary out   . ROUX-EN-Y GASTRIC BYPASS     2010 losst down to 180s gained back   . TONSILLECTOMY     1987   Family History  Problem Relation Age of Onset  . Depression Mother   . Diabetes Mother   . Hypertension Mother   . Miscarriages / Pasty Spillers Mother   . Cancer Father        prostate  . Diabetes Father   . Heart  disease Father   . Hyperlipidemia Father   . Hypertension Father   . Kidney disease Father   . Stroke Father   . Depression Sister   . Alcohol abuse Brother   . Cancer Brother        lung   . Drug abuse Brother   . Arthritis Maternal Grandmother   . Diabetes Maternal Grandmother   . Hypertension Maternal Grandmother   . Stroke Maternal Grandmother   . Alcohol abuse Maternal Grandfather   . Stroke Paternal Grandmother   . Cancer Paternal Grandfather        ? type  . Alcohol abuse Brother   . Early death Brother    Social History   Socioeconomic History  . Marital status: Single    Spouse name: Not on file  . Number of children: Not on file  . Years of education: Not on file  . Highest education level: Not on file  Occupational History  . Not on file  Tobacco Use  . Smoking status: Never Smoker  . Smokeless tobacco: Never Used  Vaping Use  . Vaping Use: Never used  Substance and Sexual Activity  . Alcohol use: Yes    Alcohol/week: 2.0 - 3.0 standard drinks    Types: 2 - 3 Glasses of wine per week  . Drug use: No  . Sexual activity: Yes    Comment:  men  Other Topics Concern  . 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 Difficulty of Paying Living Expenses: Not on file  Food Insecurity:   . Worried About Programme researcher, broadcasting/film/video in the Last Year: Not on file  . Ran Out of Food in the Last Year: Not on file  Transportation Needs:   . Lack of Transportation (Medical): Not on file  . Lack of Transportation (Non-Medical): Not on file  Physical Activity:   . Days of Exercise per Week: Not on file  . Minutes of Exercise per Session: Not on file  Stress:   . Feeling of Stress : Not on file  Social  Connections:   . Frequency of Communication with Friends and Family: Not on file  . Frequency of Social Gatherings with Friends and Family: Not on file  . Attends Religious Services: Not on file  . Active Member of Clubs or Organizations: Not on file  . Attends Banker Meetings: Not on file  . Marital Status: Not on file  Intimate Partner Violence:   . Fear of Current or Ex-Partner: Not on file  . Emotionally Abused: Not on file  . Physically Abused: Not on file  . Sexually Abused: Not on file   Current Meds  Medication Sig  . amLODipine (NORVASC) 10 MG tablet Take 1 tablet (10 mg total) by mouth daily.  . Ascorbic Acid (VITAMIN C PO) Take 1,000 mg by mouth daily.  . chlorthalidone (HYGROTON) 25 MG tablet Take 1 tablet (25 mg total) by mouth daily.  . Cholecalciferol (VITAMIN D) 50 MCG (2000 UT) CAPS Take by mouth daily.  Marland Kitchen losartan (COZAAR) 50 MG tablet Take 1 tablet (50 mg total) by mouth daily. At night  . Multiple Vitamins-Minerals (CENTRUM WOMEN PO) Take by mouth.  . spironolactone (ALDACTONE) 50 MG tablet Take 1 tablet (50 mg total) by mouth daily.  . vitamin B-12 (CYANOCOBALAMIN) 1000 MCG tablet Take 1,000 mcg by mouth daily.  . [DISCONTINUED] amLODipine (NORVASC) 10 MG tablet Take 1 tablet (10 mg total) by mouth daily.  . [DISCONTINUED] cetirizine (ZYRTEC) 10 MG tablet Take 1 tablet (10 mg total) by mouth daily.  . [DISCONTINUED] chlorthalidone (HYGROTON) 25 MG tablet Take 1 tablet (25 mg total) by mouth daily.  . [DISCONTINUED] losartan (COZAAR) 50 MG tablet Take 1 tablet (50 mg total) by mouth daily. At night  . [DISCONTINUED] sodium chloride (OCEAN) 0.65 % SOLN nasal spray Place 1 spray into both nostrils as needed for congestion.  . [DISCONTINUED] spironolactone (ALDACTONE) 50 MG tablet Take 1 tablet (50 mg total) by mouth daily.   Allergies  Allergen Reactions  . Penicillins Rash   No results found for this or any previous visit (from the past 2160  hour(s)). Objective  Body mass index is 54.91 kg/m. Wt Readings from Last 3 Encounters:  10/31/19 (!) 330 lb (149.7 kg)  04/30/19 (!) 306 lb (138.8 kg)  01/17/19 (!) 310 lb (140.6 kg)   Temp Readings from Last 3 Encounters:  10/31/19 98.5 F (36.9 C) (Oral)  01/01/19 (!) 96.3 F (35.7 C) (Temporal)  12/16/18 98.4 F (36.9  C) (Oral)   BP Readings from Last 3 Encounters:  10/31/19 126/78  01/17/19 (!) 134/108  01/01/19 111/84   Pulse Readings from Last 3 Encounters:  10/31/19 81  01/01/19 71  12/16/18 72    Physical Exam Vitals and nursing note reviewed.  Constitutional:      Appearance: Normal appearance. She is well-developed and well-groomed. She is morbidly obese.  HENT:     Head: Normocephalic and atraumatic.  Eyes:     Conjunctiva/sclera: Conjunctivae normal.     Pupils: Pupils are equal, round, and reactive to light.  Cardiovascular:     Rate and Rhythm: Normal rate and regular rhythm.     Heart sounds: Normal heart sounds. No murmur heard.   Pulmonary:     Effort: Pulmonary effort is normal.     Breath sounds: Normal breath sounds.  Musculoskeletal:     Left knee: Tenderness present over the MCL and LCL.     Comments: Moderate ttp mcl and lcl  Skin:    General: Skin is warm and dry.  Neurological:     General: No focal deficit present.     Mental Status: She is alert and oriented to person, place, and time. Mental status is at baseline.     Gait: Gait normal.  Psychiatric:        Attention and Perception: Attention and perception normal.        Mood and Affect: Mood and affect normal.        Speech: Speech normal.        Behavior: Behavior normal. Behavior is cooperative.        Thought Content: Thought content normal.        Cognition and Memory: Cognition and memory normal.        Judgment: Judgment normal.     Assessment  Plan  Annual physical exam -  sch fasting labs  Declines flu shot  covid 2/2 had  pfizer  Tdap call back to schedule   Check hep B statusin future currently pt w/o insurance Check vitamin D in future and B12 when gets insurance call back as of 01/17/19  HIV neg 10/22/10 declines STD check Hep C declines   LMP 2012 s/p hysterectomy both ovaries removed previously -CT ab/pelvis 10/19/10 c/w complex cystic mass in uterus with elevated CA 125 -per path report removal uterus, b/l fallopian tubes and cervix neg malignancy or dysplasia 10/2010 Dr. Lang Snow   Former smoker  rec take Mvt with iron  mammo due 2/16/2021[facility] negative  ordered  Egd/colonoscopy 01/01/19 diverticulosis, EGD no bxs f/u in 10 years colonos. She also has  rec healthy diet and exercise    Essential hypertension - Plan: spironolactone (ALDACTONE) 50 MG tablet, losartan (COZAAR) 50 MG tablet, chlorthalidone (HYGROTON) 25 MG tablet, amLODipine (NORVASC) 10 MG tablet, Comprehensive metabolic panel, Lipid panel, CBC with Differential/Platelet, TSH, Urinalysis, Routine w reflex microscopic Controlled cont meds   Chronic pain of left knee - Plan: DG Knee Complete 4 Views Left Tylenol, voltaren gel  Consider [facility]   Prediabetes - Plan: Hemoglobin A1c    Provider: Dr. French Ana McLean-Scocuzza-Internal Medicine

## 2019-11-03 NOTE — Addendum Note (Signed)
Addended by: Quentin Ore on: 11/03/2019 09:52 PM   Modules accepted: Orders

## 2019-11-05 ENCOUNTER — Telehealth: Payer: Self-pay

## 2019-11-05 NOTE — Telephone Encounter (Signed)
CD burned & left up front.

## 2019-11-05 NOTE — Telephone Encounter (Signed)
Pt needs a copy of xray done on 10/31/19. She has appointment with [facility] on Friday and needs it before then. Please call patient when ready for pick up. Thanks.

## 2019-11-18 ENCOUNTER — Telehealth (INDEPENDENT_AMBULATORY_CARE_PROVIDER_SITE_OTHER): Payer: Commercial Managed Care - PPO | Admitting: Internal Medicine

## 2019-11-18 ENCOUNTER — Encounter: Payer: Self-pay | Admitting: Internal Medicine

## 2019-11-18 ENCOUNTER — Other Ambulatory Visit: Payer: Self-pay

## 2019-11-18 VITALS — Ht 65.0 in | Wt 330.0 lb

## 2019-11-18 DIAGNOSIS — G4700 Insomnia, unspecified: Secondary | ICD-10-CM

## 2019-11-18 DIAGNOSIS — R112 Nausea with vomiting, unspecified: Secondary | ICD-10-CM | POA: Diagnosis not present

## 2019-11-18 DIAGNOSIS — F419 Anxiety disorder, unspecified: Secondary | ICD-10-CM | POA: Diagnosis not present

## 2019-11-18 DIAGNOSIS — F32A Depression, unspecified: Secondary | ICD-10-CM | POA: Diagnosis not present

## 2019-11-18 DIAGNOSIS — R630 Anorexia: Secondary | ICD-10-CM

## 2019-11-18 DIAGNOSIS — Z9884 Bariatric surgery status: Secondary | ICD-10-CM

## 2019-11-18 MED ORDER — CITALOPRAM HYDROBROMIDE 10 MG PO TABS: 10.0000 mg | ORAL_TABLET | Freq: Every day | ORAL | 3 refills | Status: DC

## 2019-11-18 MED ORDER — ONDANSETRON HCL 4 MG PO TABS: 4.0000 mg | ORAL_TABLET | Freq: Three times a day (TID) | ORAL | 2 refills | Status: DC | PRN

## 2019-11-18 NOTE — Progress Notes (Addendum)
telephone Note  I connected with Maria Quinn  on 11/18/19 at  9:45 AM EST by telephone and verified that I am speaking with the correct person using two identifiers.  Location patient: home Location provider:work or home office Persons participating in the virtual visit: patient, provider  I discussed the limitations of evaluation and management by telemedicine and the availability of in person appointments. The patient expressed understanding and agreed to proceed.   HPI:  Anxiety/depression/insomnia or oversleeping sleeping 2 hrs and nauseated due to fathers health declining and she is crying a lot phq 9 score 20 and GAD 7 score 19 today  Agreeable to meds and therapy   ROS: See pertinent positives and negatives per HPI.  Past Medical History:  Diagnosis Date  . Arthritis   . Chicken pox   . Depression   . Gastric bypass for obesity complicating pregnancy, birth, puerperium   . Hypertension   . OSA (obstructive sleep apnea)     Past Surgical History:  Procedure Laterality Date  . ABDOMINAL HYSTERECTOMY     Dr. Lang Snow 2012   . COLONOSCOPY WITH PROPOFOL N/A 01/01/2019   Procedure: COLONOSCOPY WITH PROPOFOL;  Surgeon: Pasty Spillers, MD;  Location: [facility];  Service: Endoscopy;  Laterality: N/A;  . ESOPHAGOGASTRODUODENOSCOPY (EGD) WITH PROPOFOL N/A 01/01/2019   Procedure: ESOPHAGOGASTRODUODENOSCOPY (EGD) WITH PROPOFOL;  Surgeon: Pasty Spillers, MD;  Location: [facility];  Service: Endoscopy;  Laterality: N/A;  . OOPHORECTOMY     39 y.o 1 ovary out per pt, later in life 2nd ovary out   . ROUX-EN-Y GASTRIC BYPASS     2010 losst down to 180s gained back   . TONSILLECTOMY     1987     Current Outpatient Medications:  .  amLODipine (NORVASC) 10 MG tablet, Take 1 tablet (10 mg total) by mouth daily., Disp: 90 tablet, Rfl: 3 .  Ascorbic Acid (VITAMIN C PO), Take 1,000 mg by mouth daily., Disp: , Rfl:  .  chlorthalidone (HYGROTON) 25 MG tablet, Take 1  tablet (25 mg total) by mouth daily., Disp: 90 tablet, Rfl: 3 .  Cholecalciferol (VITAMIN D) 50 MCG (2000 UT) CAPS, Take by mouth daily., Disp: , Rfl:  .  losartan (COZAAR) 50 MG tablet, Take 1 tablet (50 mg total) by mouth daily. At night, Disp: 90 tablet, Rfl: 3 .  Multiple Vitamins-Minerals (CENTRUM WOMEN PO), Take by mouth., Disp: , Rfl:  .  POTASSIUM PO, Take by mouth., Disp: , Rfl:  .  spironolactone (ALDACTONE) 50 MG tablet, Take 1 tablet (50 mg total) by mouth daily., Disp: 90 tablet, Rfl: 3 .  vitamin B-12 (CYANOCOBALAMIN) 1000 MCG tablet, Take 1,000 mcg by mouth daily., Disp: , Rfl:  .  citalopram (CELEXA) 10 MG tablet, Take 1 tablet (10 mg total) by mouth daily., Disp: 90 tablet, Rfl: 3 .  ondansetron (ZOFRAN) 4 MG tablet, Take 1 tablet (4 mg total) by mouth every 8 (eight) hours as needed for nausea or vomiting., Disp: 30 tablet, Rfl: 2  EXAM:  VITALS per patient if applicable:  GENERAL: alert, oriented, appears well and in no acute distress  PSYCH/NEURO: pleasant and cooperative, + obvious depression or anxiety, speech and thought processing grossly intact  ASSESSMENT AND PLAN:  Discussed the following assessment and plan:  Anxiety and depression - Plan: citalopram (CELEXA) 10 MG tablet  Nausea and vomiting, intractability of vomiting not specified, unspecified vomiting type - Plan: ondansetron (ZOFRAN) 4 MG tablet  Appetite loss  Insomnia, unspecified type  -  we discussed possible serious and likely etiologies, options for evaluation and workup, limitations of telemedicine visit vs in person visit, treatment, treatment risks and precautions.   I discussed the assessment and treatment plan with the patient. The patient was provided an opportunity to ask questions and all were answered. The patient agreed with the plan and demonstrated an understanding of the instructions.    Time 20 minutes  Bevelyn Buckles, MD

## 2020-02-17 ENCOUNTER — Encounter: Payer: Self-pay | Admitting: Internal Medicine

## 2020-02-18 NOTE — Addendum Note (Signed)
Addended by: Quentin Ore on: 02/18/2020 01:20 PM   Modules accepted: Orders

## 2020-04-13 ENCOUNTER — Other Ambulatory Visit: Payer: Self-pay

## 2020-04-13 ENCOUNTER — Encounter (INDEPENDENT_AMBULATORY_CARE_PROVIDER_SITE_OTHER): Payer: Self-pay | Admitting: Family Medicine

## 2020-04-13 ENCOUNTER — Ambulatory Visit (INDEPENDENT_AMBULATORY_CARE_PROVIDER_SITE_OTHER): Payer: 59 | Admitting: Family Medicine

## 2020-04-13 VITALS — BP 129/89 | HR 79 | Temp 98.0°F | Ht 65.0 in | Wt 328.0 lb

## 2020-04-13 DIAGNOSIS — G4733 Obstructive sleep apnea (adult) (pediatric): Secondary | ICD-10-CM

## 2020-04-13 DIAGNOSIS — I10 Essential (primary) hypertension: Secondary | ICD-10-CM

## 2020-04-13 DIAGNOSIS — R0602 Shortness of breath: Secondary | ICD-10-CM | POA: Diagnosis not present

## 2020-04-13 DIAGNOSIS — E1165 Type 2 diabetes mellitus with hyperglycemia: Secondary | ICD-10-CM

## 2020-04-13 DIAGNOSIS — R5383 Other fatigue: Secondary | ICD-10-CM | POA: Diagnosis not present

## 2020-04-13 DIAGNOSIS — Z1331 Encounter for screening for depression: Secondary | ICD-10-CM | POA: Diagnosis not present

## 2020-04-13 DIAGNOSIS — E559 Vitamin D deficiency, unspecified: Secondary | ICD-10-CM

## 2020-04-13 DIAGNOSIS — F419 Anxiety disorder, unspecified: Secondary | ICD-10-CM

## 2020-04-13 DIAGNOSIS — F32A Depression, unspecified: Secondary | ICD-10-CM

## 2020-04-13 DIAGNOSIS — Z6841 Body Mass Index (BMI) 40.0 and over, adult: Secondary | ICD-10-CM

## 2020-04-13 DIAGNOSIS — E538 Deficiency of other specified B group vitamins: Secondary | ICD-10-CM

## 2020-04-13 DIAGNOSIS — D508 Other iron deficiency anemias: Secondary | ICD-10-CM

## 2020-04-13 DIAGNOSIS — Z9884 Bariatric surgery status: Secondary | ICD-10-CM

## 2020-04-14 LAB — THYROID PANEL WITH TSH
Free Thyroxine Index: 2.6 (ref 1.2–4.9)
T3 Uptake Ratio: 30 % (ref 24–39)
T4, Total: 8.6 ug/dL (ref 4.5–12.0)
TSH: 1.49 u[IU]/mL (ref 0.450–4.500)

## 2020-04-14 LAB — COMPREHENSIVE METABOLIC PANEL
ALT: 21 IU/L (ref 0–32)
AST: 20 IU/L (ref 0–40)
Albumin/Globulin Ratio: 1.3 (ref 1.2–2.2)
Albumin: 4.3 g/dL (ref 3.8–4.8)
Alkaline Phosphatase: 59 IU/L (ref 44–121)
BUN/Creatinine Ratio: 19 (ref 9–23)
BUN: 14 mg/dL (ref 6–24)
Bilirubin Total: 0.4 mg/dL (ref 0.0–1.2)
CO2: 23 mmol/L (ref 20–29)
Calcium: 9.4 mg/dL (ref 8.7–10.2)
Chloride: 98 mmol/L (ref 96–106)
Creatinine, Ser: 0.74 mg/dL (ref 0.57–1.00)
Globulin, Total: 3.2 g/dL (ref 1.5–4.5)
Glucose: 146 mg/dL — ABNORMAL HIGH (ref 65–99)
Potassium: 4.5 mmol/L (ref 3.5–5.2)
Sodium: 138 mmol/L (ref 134–144)
Total Protein: 7.5 g/dL (ref 6.0–8.5)
eGFR: 104 mL/min/{1.73_m2} (ref >59)

## 2020-04-14 LAB — CBC WITH DIFFERENTIAL/PLATELET
Basophils Absolute: 0 10*3/uL (ref 0.0–0.2)
Basos: 1 %
EOS (ABSOLUTE): 0 10*3/uL (ref 0.0–0.4)
Eos: 1 %
Hemoglobin: 12.5 g/dL (ref 11.1–15.9)
Immature Grans (Abs): 0 10*3/uL (ref 0.0–0.1)
Immature Granulocytes: 1 %
Lymphocytes Absolute: 1.5 10*3/uL (ref 0.7–3.1)
Lymphs: 25 %
MCH: 23.1 pg — ABNORMAL LOW (ref 26.6–33.0)
MCHC: 30.3 g/dL — ABNORMAL LOW (ref 31.5–35.7)
MCV: 76 fL — ABNORMAL LOW (ref 79–97)
Monocytes Absolute: 0.8 10*3/uL (ref 0.1–0.9)
Monocytes: 12 %
Neutrophils Absolute: 3.8 10*3/uL (ref 1.4–7.0)
Neutrophils: 60 %
Platelets: 240 10*3/uL (ref 150–450)
RBC: 5.41 x10E6/uL — ABNORMAL HIGH (ref 3.77–5.28)
RDW: 14.7 % (ref 11.7–15.4)
WBC: 6.2 10*3/uL (ref 3.4–10.8)

## 2020-04-14 LAB — LIPID PANEL WITH LDL/HDL RATIO
Cholesterol, Total: 180 mg/dL (ref 100–199)
HDL: 52 mg/dL (ref >39)
LDL Chol Calc (NIH): 101 mg/dL — ABNORMAL HIGH (ref 0–99)
LDL/HDL Ratio: 1.9 ratio (ref 0.0–3.2)
Triglycerides: 152 mg/dL — ABNORMAL HIGH (ref 0–149)
VLDL Cholesterol Cal: 27 mg/dL (ref 5–40)

## 2020-04-14 LAB — ANEMIA PANEL
Ferritin: 88 ng/mL (ref 15–150)
Folate, Hemolysate: 410 ng/mL
Folate, RBC: 993 ng/mL (ref >498)
Hematocrit: 41.3 % (ref 34.0–46.6)
Iron Saturation: 23 % (ref 15–55)
Iron: 77 ug/dL (ref 27–159)
Retic Ct Pct: 1.6 % (ref 0.6–2.6)
Total Iron Binding Capacity: 334 ug/dL (ref 250–450)
UIBC: 257 ug/dL (ref 131–425)
Vitamin B-12: 1472 pg/mL — ABNORMAL HIGH (ref 232–1245)

## 2020-04-14 LAB — INSULIN, RANDOM: INSULIN: 24.1 u[IU]/mL (ref 2.6–24.9)

## 2020-04-14 LAB — HEMOGLOBIN A1C
Est. average glucose Bld gHb Est-mCnc: 174 mg/dL
Hgb A1c MFr Bld: 7.7 % — ABNORMAL HIGH (ref 4.8–5.6)

## 2020-04-14 LAB — FOLATE: Folate: >20 ng/mL (ref >3.0)

## 2020-04-14 LAB — VITAMIN D 25 HYDROXY (VIT D DEFICIENCY, FRACTURES): Vit D, 25-Hydroxy: 25.7 ng/mL — ABNORMAL LOW (ref 30.0–100.0)

## 2020-04-14 LAB — PREALBUMIN: PREALBUMIN: 27 mg/dL (ref 12–34)

## 2020-04-18 NOTE — Progress Notes (Signed)
Dear Dr. Judie Grieve,   Thank you for referring Nolene Rocks Yaffe to our clinic. The following note includes my evaluation and treatment recommendations.  Chief Complaint:   OBESITY ELWANDA MOGER (MR# [id number]) is a 40 y.o. female who presents for evaluation and treatment of obesity and related comorbidities. Current BMI is Body mass index is 54.58 kg/m. Kiwanna has been struggling with her weight for many years and has been unsuccessful in either losing weight, maintaining weight loss, or reaching her healthy weight goal.  Caley is currently in the action stage of change and ready to dedicate time achieving and maintaining a healthier weight. Bess is interested in becoming our patient and working on intensive lifestyle modifications including (but not limited to) diet and exercise for weight loss.  Frankye was referred by Dr. Judie Grieve. She had gastric bypass in 2010. Travels on weekends. Plant based sausage x 2 patties + [employer] chai tea latte iced with almond milk + bagel (asiago cheese with cream cheese) (feel satisfied); ~2 hours salad from [employer] or wrap from [place] (feel satisfied); snack nut thins; Dinner- salmon + rice + spices + mayo (feel satisfied).  Bryton's habits were reviewed today and are as follows: her desired weight loss is 128 lbs, she has been heavy most of her life, she started gaining weight in her teen years, her heaviest weight ever was 365 pounds, she is a picky eater and doesn't like to eat healthier foods, she has significant food cravings issues, she snacks frequently in the evenings, she is frequently drinking liquids with calories, she frequently makes poor food choices, she has problems with excessive hunger, she frequently eats larger portions than normal, she has binge eating behaviors and she struggles with emotional eating.  Depression Screen Rosalene's Food and Mood (modified PHQ-9) score was 9.  Depression screen PHQ 2/9 04/13/2020  Decreased  Interest 1  Down, Depressed, Hopeless 1  PHQ - 2 Score 2  Altered sleeping 2  Tired, decreased energy 1  Change in appetite 1  Feeling bad or failure about yourself  1  Trouble concentrating 2  Moving slowly or fidgety/restless 0  Suicidal thoughts 0  PHQ-9 Score 9  Difficult doing work/chores Somewhat difficult   Subjective:   1. Other fatigue Vincenta admits to daytime somnolence and admits to waking up still tired. Patent has a history of symptoms of morning headache. Priscillia generally gets 4-6 hours of sleep per night, and states that she has poor sleep quality. Snoring is present. Apneic episodes are present. Epworth Sleepiness Score is 5. EKG normal sinus rhythm at 78 bpm; T-wave inversion (unchanged since EKG in 2019).  2. Shortness of breath on exertion Tawonna notes increasing shortness of breath with exercising and seems to be worsening over time with weight gain. She notes getting out of breath sooner with activity than she used to. This has gotten worse recently. Gene denies shortness of breath at rest or orthopnea. EKG normal sinus rhythm at 78 bpm; T-wave inversion (unchaged since EKG in 2019).  3. Essential hypertension Aditri's BP is controlled today. Pt denies chest pain, chest pressure and headache. She is on [place], amlodipine, losartan, and spironolactone. She was diagnosed at ~40 years old.  4. Anxiety and depression Davine denies suicidal or homicidal ideations. She is prescribed Celexa but stopped due to not feeling like herself. She is still mourning the loss of her father.  5. Vitamin D deficiency Deborrah is on Vit D supplementation. Pt denies nausea, vomiting, and  muscle weakness but notes fatigue.   6. B12 deficiency Nnenna reports fatigue. She is on B12 supplementation.  7. Type 2 diabetes mellitus with hyperglycemia, without long-term current use of insulin (HCC) Selby is not on medication. She was diagnosed before weight loss surgery. Her last A1c was 6.4.    8. Other iron deficiency anemia Kima is not on additional iron supplementation. She reports fatigue.  9. OSA (obstructive sleep apnea) Roneshia has a historical diagnosis of OSA. She is not wearing a CPAP but believes she should be.  10. History of gastric bypass Gloria is s/p gastric bypass in 2010. She may be interested in revision.  Assessment/Plan:   1. Other fatigue Shelbie does feel that her weight is causing her energy to be lower than it should be. Fatigue may be related to obesity, depression or many other causes. Labs will be ordered, and in the meanwhile, Arleene will focus on self care including making healthy food choices, increasing physical activity and focusing on stress reduction.  - EKG 12-Lead - ECHOCARDIOGRAM COMPLETE  2. Shortness of breath on exertion Jenna does feel that she gets out of breath more easily that she used to when she exercises. Marymargaret's shortness of breath appears to be obesity related and exercise induced. She has agreed to work on weight loss and gradually increase exercise to treat her exercise induced shortness of breath. Will continue to monitor closely.  3. Essential hypertension Euphemia is working on healthy weight loss and exercise to improve blood pressure control. We will watch for signs of hypotension as she continues her lifestyle modifications. Check labs today.  - Comprehensive metabolic panel - Lipid Panel With LDL/HDL Ratio - Thyroid Panel With TSH - ECHOCARDIOGRAM COMPLETE  4. Anxiety and depression Behavior modification techniques were discussed today to help Saveah deal with her anxiety.  Orders and follow up as documented in patient record. Follow up at next appointment. Refer to Dr. Dewaine Conger.  5. Vitamin D deficiency Low Vitamin D level contributes to fatigue and are associated with obesity, breast, and colon cancer. She agrees to continue to take OTC Vitamin D daily and will follow-up for routine testing of Vitamin D, at least 2-3  times per year to avoid over-replacement. Check labs today.  - VITAMIN D 25 Hydroxy (Vit-D Deficiency, Fractures)  6. B12 deficiency The diagnosis was reviewed with the patient. Counseling provided today, see below. We will continue to monitor. Orders and follow up as documented in patient record. Check labs today.  Counseling . The body needs vitamin B12: to make red blood cells; to make DNA; and to help the nerves work properly so they can carry messages from the brain to the body.  . The main causes of vitamin B12 deficiency include dietary deficiency, digestive diseases, pernicious anemia, and having a surgery in which part of the stomach or small intestine is removed.  . Certain medicines can make it harder for the body to absorb vitamin B12. These medicines include: heartburn medications; some antibiotics; some medications used to treat diabetes, gout, and high cholesterol.  . In some cases, there are no symptoms of this condition. If the condition leads to anemia or nerve damage, various symptoms can occur, such as weakness or fatigue, shortness of breath, and numbness or tingling in your hands and feet.   . Treatment:  o May include taking vitamin B12 supplements.  o Avoid alcohol.  o Eat lots of healthy foods that contain vitamin B12: - Beef, pork, chicken, [place], and organ  meats, such as liver.  - Seafood: This includes clams, rainbow trout, salmon, tuna, and haddock. Eggs.  - Cereal and dairy products that are fortified: This means that vitamin B12 has been added to the food.   - Vitamin B12  7. Type 2 diabetes mellitus with hyperglycemia, without long-term current use of insulin (HCC) Good blood sugar control is important to decrease the likelihood of diabetic complications such as nephropathy, neuropathy, limb loss, blindness, coronary artery disease, and death. Intensive lifestyle modification including diet, exercise and weight loss are the first line of treatment for diabetes.  Check labs today.  - Hemoglobin A1c - Insulin, random  8. Other iron deficiency anemia Check labs today.  - CBC w/Diff/Platelet - Anemia panel  9. OSA (obstructive sleep apnea) Intensive lifestyle modifications are the first line treatment for this issue. We discussed several lifestyle modifications today and she will continue to work on diet, exercise and weight loss efforts. We will continue to monitor. Orders and follow up as documented in patient record. Refer to neurology for sleep study.  - Ambulatory referral to Neurology  10. History of gastric bypass Check labs today.  - Folate - Prealbumin  11. Morbid obesity with BMI of 50.0-59.9, adult (HCC) Federica is currently in the action stage of change and her goal is to continue with weight loss efforts. I recommend Rondi begin the structured treatment plan as follows:  She has agreed to the [employer]escatarian Plan + 600 calories.  Exercise goals: No exercise has been prescribed at this time.   Behavioral modification strategies: increasing lean protein intake, meal planning and cooking strategies and better snacking choices.  She was informed of the importance of frequent follow-up visits to maximize her success with intensive lifestyle modifications for her multiple health conditions. She was informed we would discuss her lab results at her next visit unless there is a critical issue that needs to be addressed sooner. Heidemarie agreed to keep her next visit at the agreed upon time to discuss these results.  Objective:   Blood pressure 129/89, pulse 79, temperature 98 F (36.7 C), height 5\' 5"  (1.651 m), weight (!) 328 lb (148.8 kg), last menstrual period 10/19/2010, SpO2 98 %. Body mass index is 54.58 kg/m.  EKG: Normal sinus rhythm, rate 78; T wave inversion (unchaged since EKG in 2019).  Indirect Calorimeter completed today shows a VO2 of 325 and a REE of 2263.  Her calculated basal metabolic rate is [id number] thus her basal metabolic  rate is better than expected.  General: Cooperative, alert, well developed, in no acute distress. HEENT: Conjunctivae and lids unremarkable. Cardiovascular: Regular rhythm.  Lungs: Normal work of breathing. Neurologic: No focal deficits.   Lab Results  Component Value Date   CREATININE 0.74 04/13/2020   BUN 14 04/13/2020   NA 138 04/13/2020   K 4.5 04/13/2020   CL 98 04/13/2020   CO2 23 04/13/2020   Lab Results  Component Value Date   ALT 21 04/13/2020   AST 20 04/13/2020   ALKPHOS 59 04/13/2020   BILITOT 0.4 04/13/2020   Lab Results  Component Value Date   HGBA1C 7.7 (H) 04/13/2020   HGBA1C 6.4 01/29/2018   Lab Results  Component Value Date   INSULIN 24.1 04/13/2020   Lab Results  Component Value Date   TSH 1.490 04/13/2020   Lab Results  Component Value Date   CHOL 180 04/13/2020   HDL 52 04/13/2020   LDLCALC 101 (H) 04/13/2020   TRIG 152 (H)  04/13/2020   CHOLHDL 3 01/29/2018   Lab Results  Component Value Date   WBC 6.2 04/13/2020   HGB 12.5 04/13/2020   HCT 41.3 04/13/2020   MCV 76 (L) 04/13/2020   PLT 240 04/13/2020   Lab Results  Component Value Date   IRON 77 04/13/2020   TIBC 334 04/13/2020   FERRITIN 88 04/13/2020   Attestation Statements:   Reviewed by clinician on day of visit: allergies, medications, problem list, medical history, surgical history, family history, social history, and previous encounter notes. This is the patient's first visit at [facility]. The patient's NEW PATIENT PACKET was reviewed at length. Included in the packet: current and past health history, medications, allergies, ROS, gynecologic history (women only), surgical history, family history, social history, weight history, weight loss surgery history (for those that have had weight loss surgery), nutritional evaluation, mood and food questionnaire, PHQ9, Epworth questionnaire, sleep habits questionnaire, patient life and health improvement goals  questionnaire. These will all be scanned into the patient's chart under media.   During the visit, I independently reviewed the patient's EKG, bioimpedance scale results, and indirect calorimeter results. I used this information to tailor a meal plan for the patient that will help her to lose weight and will improve her obesity-related conditions going forward. I performed a medically necessary appropriate examination and/or evaluation. I discussed the assessment and treatment plan with the patient. The patient was provided an opportunity to ask questions and all were answered. The patient agreed with the plan and demonstrated an understanding of the instructions. Labs were ordered at this visit and will be reviewed at the next visit unless more critical results need to be addressed immediately. Clinical information was updated and documented in the EMR.   Time spent on visit including pre-visit chart review and post-visit care was 60 minutes.   A separate 15 minutes was spent on risk counseling (see above).   Edmund Hilda, am acting as transcriptionist for Reuben Likes, MD.  I have reviewed the above documentation for accuracy and completeness, and I agree with the above. - Katherina Mires, MD

## 2020-04-27 ENCOUNTER — Ambulatory Visit (INDEPENDENT_AMBULATORY_CARE_PROVIDER_SITE_OTHER): Payer: 59 | Admitting: Family Medicine

## 2020-04-27 ENCOUNTER — Encounter (INDEPENDENT_AMBULATORY_CARE_PROVIDER_SITE_OTHER): Payer: Self-pay | Admitting: Family Medicine

## 2020-04-27 ENCOUNTER — Other Ambulatory Visit: Payer: Self-pay

## 2020-04-27 VITALS — BP 116/79 | HR 70 | Temp 98.0°F | Ht 65.0 in | Wt 331.0 lb

## 2020-04-27 DIAGNOSIS — E66813 Obesity, class 3: Secondary | ICD-10-CM

## 2020-04-27 DIAGNOSIS — E1159 Type 2 diabetes mellitus with other circulatory complications: Secondary | ICD-10-CM

## 2020-04-27 DIAGNOSIS — E559 Vitamin D deficiency, unspecified: Secondary | ICD-10-CM

## 2020-04-27 DIAGNOSIS — I152 Hypertension secondary to endocrine disorders: Secondary | ICD-10-CM

## 2020-04-27 DIAGNOSIS — E785 Hyperlipidemia, unspecified: Secondary | ICD-10-CM

## 2020-04-27 DIAGNOSIS — E1165 Type 2 diabetes mellitus with hyperglycemia: Secondary | ICD-10-CM | POA: Diagnosis not present

## 2020-04-27 DIAGNOSIS — R718 Other abnormality of red blood cells: Secondary | ICD-10-CM

## 2020-04-27 DIAGNOSIS — E1169 Type 2 diabetes mellitus with other specified complication: Secondary | ICD-10-CM | POA: Diagnosis not present

## 2020-04-27 DIAGNOSIS — Z6841 Body Mass Index (BMI) 40.0 and over, adult: Secondary | ICD-10-CM

## 2020-04-27 MED ORDER — METFORMIN HCL 500 MG PO TABS: 500.0000 mg | ORAL_TABLET | Freq: Every day | ORAL | 0 refills | Status: DC

## 2020-04-27 MED ORDER — OZEMPIC (0.25 OR 0.5 MG/DOSE) 2 MG/1.5ML ~~LOC~~ SOPN: 0.2500 mg | PEN_INJECTOR | SUBCUTANEOUS | 0 refills | Status: DC

## 2020-04-27 MED ORDER — VITAMIN D (ERGOCALCIFEROL) 1.25 MG (50000 UNIT) PO CAPS: 50000.0000 [IU] | ORAL_CAPSULE | ORAL | 0 refills | Status: DC

## 2020-04-28 ENCOUNTER — Telehealth (INDEPENDENT_AMBULATORY_CARE_PROVIDER_SITE_OTHER): Payer: Self-pay | Admitting: Emergency Medicine

## 2020-04-28 NOTE — Telephone Encounter (Signed)
Prior Auth: Initiated: NO PA NEEDED due to the dx of diabetes  (Key: [id number]) Ozempic (0.25 or 0.5 MG/DOSE) 2MG /1.5ML pen-injectors

## 2020-04-29 NOTE — Progress Notes (Signed)
Chief Complaint:   OBESITY Maria Quinn is here to discuss her progress with her obesity treatment plan along with follow-up of her obesity related diagnoses. Maria Quinn is on the Pescatarian Plan + 400 calories and states she is following her eating plan approximately 80% of the time. Maria Quinn states she is working out to videos 20-40 minutes 3-4 times per week.  Today's visit was #: 2 Starting weight: 328 lbs Starting date: 04/13/2020 Today's weight: 331 lbs Today's date: 04/27/2020 Total lbs lost to date: 0 Total lbs lost since last in-office visit: 0  Interim History: First few weeks were fairly easy- all stuff she normally eats. [name] found making the time to prep and cook to be the biggest obstacles. For snack calories, she was doing nut thins and laughing cow, premier protein shakes. She was using all 400 snack calories. Pt wasn't able to get all food in. She does recognize Friday, Saturday, and Sunday she is drinking ~2 bottles of wine daily.  Subjective:   1. Hyperlipidemia associated with type 2 diabetes mellitus (HCC) Maria Quinn's LDL is 101, HDL 52, triglycerides 152, and cholesterol 180. She is not on statin therapy. She has an ASCVD 10 year risk score of 2.3% (optimal 0.3%). moderate intensity statin encouraged.  2. Vitamin D deficiency Maria Quinn is on OTC Vit D 2,000 IU daily. Pt denies nausea, vomiting, and muscle weakness but notes fatigue.  3. Hypertension associated with diabetes (HCC)  [name] denies chest pain, chest pressure and headache. She is on Norvasc, Cozaar, and Aldactone.  4. RBC microcytosis MCV 76, Hgb and Hct are normal, and RBC elevated at 5.41. Maria Quinn has no history of sickle cell (tested as a child).  5. Type 2 diabetes mellitus with hyperglycemia, without long-term current use of insulin (HCC) Maria Quinn has an A1c of 7.7. She is not on medication, however, was previously on Novolog insulin.  Assessment/Plan:   1. Hyperlipidemia associated with type 2 diabetes mellitus  (HCC) Cardiovascular risk and specific lipid/LDL goals reviewed.  We discussed several lifestyle modifications today and [name] will continue to work on diet, exercise and weight loss efforts. Orders and follow up as documented in patient record. Repeat labs in 3 months. Will discuss meds at that time.  Counseling Intensive lifestyle modifications are the first line treatment for this issue. . Dietary changes: Increase soluble fiber. Decrease simple carbohydrates. . Exercise changes: Moderate to vigorous-intensity aerobic activity 150 minutes per week if tolerated. . Lipid-lowering medications: see documented in medical record.  2. Vitamin D deficiency Low Vitamin D level contributes to fatigue and are associated with obesity, breast, and colon cancer. She agrees to stop OTC Vit D and start to take prescription Vitamin D @50 ,000 IU every week and will follow-up for routine testing of Vitamin D, at least 2-3 times per year to avoid over-replacement.  - Vitamin D, Ergocalciferol, (DRISDOL) 1.25 MG (50000 UNIT) CAPS capsule; Take 1 capsule (50,000 Units total) by mouth every 7 (seven) days.  Dispense: 4 capsule; Refill: 0  3. Hypertension associated with diabetes (HCC) [name] is working on healthy weight loss and exercise to improve blood pressure control. We will watch for signs of hypotension as she continues her lifestyle modifications. Decrease spironolactone to 25 mg daily.  4. RBC microcytosis Follow up labs at next appointment.  5. Type 2 diabetes mellitus with hyperglycemia, without long-term current use of insulin (HCC) Good blood sugar control is important to decrease the likelihood of diabetic complications such as nephropathy, neuropathy, limb loss, blindness, coronary  artery disease, and death. Intensive lifestyle modification including diet, exercise and weight loss are the first line of treatment for diabetes. Start Ozempic 0.25 mg, as per below. Start Metformin 500 mg, as per  below.  - Semaglutide,0.25 or 0.5MG /DOS, (OZEMPIC, 0.25 OR 0.5 MG/DOSE,) 2 MG/1.5ML SOPN; Inject 0.25 mg into the skin once a week.  Dispense: 1.5 mL; Refill: 0 - metFORMIN (GLUCOPHAGE) 500 MG tablet; Take 1 tablet (500 mg total) by mouth daily with breakfast.  Dispense: 30 tablet; Refill: 0  6. Class 3 severe obesity with serious comorbidity and body mass index (BMI) of 50.0 to 59.9 in adult, unspecified obesity type (HCC) Maria Quinn is currently in the action stage of change. As such, her goal is to continue with weight loss efforts. She has agreed to the [employer] + 400 calories with alterations to get up to 1500 calories.   Exercise goals: No exercise has been prescribed at this time.  Behavioral modification strategies: increasing lean protein intake, meal planning and cooking strategies, keeping healthy foods in the home and planning for success.  Maria Quinn has agreed to follow-up with our clinic in 2-3 weeks. She was informed of the importance of frequent follow-up visits to maximize her success with intensive lifestyle modifications for her multiple health conditions.   Objective:   Blood pressure 116/79, pulse 70, temperature 98 F (36.7 C), height 5\' 5"  (1.651 m), weight (!) 331 lb (150.1 kg), last menstrual period 10/19/2010, SpO2 96 %. Body mass index is 55.08 kg/m.  General: Cooperative, alert, well developed, in no acute distress. HEENT: Conjunctivae and lids unremarkable. Cardiovascular: Regular rhythm.  Lungs: Normal work of breathing. Neurologic: No focal deficits.   Lab Results  Component Value Date   CREATININE 0.74 04/13/2020   BUN 14 04/13/2020   NA 138 04/13/2020   K 4.5 04/13/2020   CL 98 04/13/2020   CO2 23 04/13/2020   Lab Results  Component Value Date   ALT 21 04/13/2020   AST 20 04/13/2020   ALKPHOS 59 04/13/2020   BILITOT 0.4 04/13/2020   Lab Results  Component Value Date   HGBA1C 7.7 (H) 04/13/2020   HGBA1C 6.4 01/29/2018   Lab Results   Component Value Date   INSULIN 24.1 04/13/2020   Lab Results  Component Value Date   TSH 1.490 04/13/2020   Lab Results  Component Value Date   CHOL 180 04/13/2020   HDL 52 04/13/2020   LDLCALC 101 (H) 04/13/2020   TRIG 152 (H) 04/13/2020   CHOLHDL 3 01/29/2018   Lab Results  Component Value Date   WBC 6.2 04/13/2020   HGB 12.5 04/13/2020   HCT 41.3 04/13/2020   MCV 76 (L) 04/13/2020   PLT 240 04/13/2020   Lab Results  Component Value Date   IRON 77 04/13/2020   TIBC 334 04/13/2020   FERRITIN 88 04/13/2020    Attestation Statements:   Reviewed by clinician on day of visit: allergies, medications, problem list, medical history, surgical history, family history, social history, and previous encounter notes.  06/13/2020, am acting as transcriptionist for Edmund Hilda, MD.   I have reviewed the above documentation for accuracy and completeness, and I agree with the above. - Reuben Likes, MD

## 2020-04-30 ENCOUNTER — Ambulatory Visit: Payer: Commercial Managed Care - PPO | Admitting: Internal Medicine

## 2020-04-30 ENCOUNTER — Ambulatory Visit (INDEPENDENT_AMBULATORY_CARE_PROVIDER_SITE_OTHER): Payer: 59 | Admitting: Internal Medicine

## 2020-04-30 ENCOUNTER — Encounter: Payer: Self-pay | Admitting: Internal Medicine

## 2020-04-30 ENCOUNTER — Other Ambulatory Visit: Payer: Self-pay

## 2020-04-30 VITALS — BP 122/86 | HR 62 | Temp 97.7°F | Ht 65.0 in | Wt 329.6 lb

## 2020-04-30 DIAGNOSIS — F4323 Adjustment disorder with mixed anxiety and depressed mood: Secondary | ICD-10-CM

## 2020-04-30 DIAGNOSIS — I517 Cardiomegaly: Secondary | ICD-10-CM | POA: Insufficient documentation

## 2020-04-30 DIAGNOSIS — M542 Cervicalgia: Secondary | ICD-10-CM

## 2020-04-30 DIAGNOSIS — R718 Other abnormality of red blood cells: Secondary | ICD-10-CM | POA: Insufficient documentation

## 2020-04-30 DIAGNOSIS — I152 Hypertension secondary to endocrine disorders: Secondary | ICD-10-CM

## 2020-04-30 DIAGNOSIS — Z8639 Personal history of other endocrine, nutritional and metabolic disease: Secondary | ICD-10-CM | POA: Diagnosis not present

## 2020-04-30 DIAGNOSIS — F4321 Adjustment disorder with depressed mood: Secondary | ICD-10-CM | POA: Diagnosis not present

## 2020-04-30 DIAGNOSIS — E119 Type 2 diabetes mellitus without complications: Secondary | ICD-10-CM

## 2020-04-30 DIAGNOSIS — E1159 Type 2 diabetes mellitus with other circulatory complications: Secondary | ICD-10-CM | POA: Diagnosis not present

## 2020-04-30 DIAGNOSIS — R9431 Abnormal electrocardiogram [ECG] [EKG]: Secondary | ICD-10-CM | POA: Insufficient documentation

## 2020-04-30 DIAGNOSIS — I10 Essential (primary) hypertension: Secondary | ICD-10-CM

## 2020-04-30 DIAGNOSIS — Z794 Long term (current) use of insulin: Secondary | ICD-10-CM

## 2020-04-30 DIAGNOSIS — Z1231 Encounter for screening mammogram for malignant neoplasm of breast: Secondary | ICD-10-CM

## 2020-04-30 DIAGNOSIS — E559 Vitamin D deficiency, unspecified: Secondary | ICD-10-CM | POA: Insufficient documentation

## 2020-04-30 LAB — GLUCOSE, POCT (MANUAL RESULT ENTRY): POC Glucose: 146 mg/dl — AB (ref 70–99)

## 2020-04-30 MED ORDER — BLOOD GLUCOSE METER KIT: PACK | 0 refills | Status: DC

## 2020-04-30 MED ORDER — SPIRONOLACTONE 50 MG PO TABS: 25.0000 mg | ORAL_TABLET | Freq: Every day | ORAL | 3 refills | Status: DC

## 2020-04-30 MED ORDER — BLOOD GLUCOSE METER KIT: PACK | 0 refills | Status: AC

## 2020-04-30 NOTE — Progress Notes (Signed)
Chief Complaint  Patient presents with  . Follow-up   F/u  1. BP overall controlled on norvasc 10 mg qd, chlorthalidone 25 mg qd, losartan 50 mg qd and spironolactone 25 mg qd  H/o LVH and T wave changes on EKG will do echo   2. Morbid obesity s/p wt loss surgery on ozempic 0.25 will start Sunday and Metformin due to A1C 7.7 cbg today 146  3. C/l left arm numbness and neck pain  4. MCV low consider hematology w/u in the future  5. Grief dad died 11/2019 and having depression and drinking more wine some hard liq. She is agreeable to therapy did not go previously    Review of Systems  Constitutional: Negative for weight loss.  HENT: Negative for hearing loss.   Eyes: Negative for blurred vision.  Respiratory: Negative for shortness of breath.   Cardiovascular: Negative for chest pain.  Gastrointestinal: Negative for abdominal pain.  Musculoskeletal: Positive for neck pain.  Skin: Negative for rash.  Neurological: Positive for sensory change. Negative for headaches.  Psychiatric/Behavioral: Positive for depression.   Past Medical History:  Diagnosis Date  . Anemia   . Anxiety   . Arthritis   . Back pain   . Chicken pox   . Depression   . Exocrine pancreatic insufficiency   . Gastric bypass for obesity complicating pregnancy, birth, puerperium   . Hyperlipidemia   . Hypertension   . OSA (obstructive sleep apnea)   . Other fatigue   . Pre-diabetes   . Rheumatoid arthritis (HCC)   . Sleep apnea   . SOB (shortness of breath) on exertion   . Swelling of both lower extremities   . Vitamin D deficiency    Past Surgical History:  Procedure Laterality Date  . ABDOMINAL HYSTERECTOMY     Dr. Henly 2012   . COLONOSCOPY WITH PROPOFOL N/A 01/01/2019   Procedure: COLONOSCOPY WITH PROPOFOL;  Surgeon: Tahiliani, Varnita B, MD;  Location: [facility] END[facility]COPY;  Service: Endoscopy;  Laterality: N/A;  . ESOPHAGOGASTRODUODENOSCOPY (EGD) WITH PROPOFOL N/A 01/01/2019   Procedure:  ESOPHAGOGASTRODUODENOSCOPY (EGD) WITH PROPOFOL;  Surgeon: Tahiliani, Varnita B, MD;  Location: [facility] ENDOSCOPY;  Service: Endoscopy;  Laterality: N/A;  . OOPHORECTOMY     40 y.o 1 ovary out per pt, later in life 2nd ovary out   . ROUX-EN-Y GASTRIC BYPASS     2010 losst down to 180s gained back   . TONSILLECTOMY     [age]   Family History  Problem Relation Age of Onset  . Depression Mother   . Diabetes Mother   . Hypertension Mother   . Miscarriages / [place] Mother   . Cancer Father        prostate  . Diabetes Father   . Heart disease Father   . Hyperlipidemia Father   . Hypertension Father   . Kidney disease Father   . Stroke Father   . Depression Sister   . Alcohol abuse Brother   . Cancer Brother        lung   . Drug abuse Brother   . Arthritis Maternal Grandmother   . Diabetes Maternal Grandmother   . Hypertension Maternal Grandmother   . Stroke Maternal Grandmother   . Alcohol abuse Maternal Grandfather   . Stroke Paternal Grandmother   . Cancer Paternal Grandfather        ? type  . Alcohol abuse Brother   . Early death Brother    Social History   Socioeconomic History  .  Marital status: Single    Spouse name: Not on file  . Number of children: Not on file  . Years of education: Not on file  . Highest education level: Not on file  Occupational History  . Occupation: Scientist, product/process development  Tobacco Use  . Smoking status: Never Smoker  . Smokeless tobacco: Never Used  Vaping Use  . Vaping Use: Never used  Substance and Sexual Activity  . Alcohol use: Yes    Alcohol/week: 2.0 - 3.0 standard drinks    Types: 2 - 3 Glasses of wine per week  . Drug use: No  . Sexual activity: Yes    Comment: men  Other Topics Concern  . 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 amLODipine (NORVASC) 10 MG tablet Take 1 tablet (10 mg total) by mouth daily.  . Ascorbic Acid (VITAMIN C PO) Take 1,000 mg by mouth daily.  . chlorthalidone (HYGROTON) 25 MG tablet Take 1 tablet (25 mg total) by mouth daily.  Marland Kitchen losartan (COZAAR) 50 MG tablet Take 1 tablet (50 mg total) by mouth daily. At night  . metFORMIN (GLUCOPHAGE) 500 MG tablet Take 1 tablet (500 mg total) by mouth daily with breakfast.  . Multiple Vitamins-Minerals (CENTRUM WOMEN PO) Take by mouth.  Marland Kitchen POTASSIUM PO Take by mouth.  . Semaglutide,0.25 or 0.5MG/DOS, (OZEMPIC, 0.25 OR 0.5 MG/DOSE,) 2 MG/1.5ML SOPN Inject 0.25 mg into the skin once a week.  . Vitamin D, Ergocalciferol, (DRISDOL) 1.25 MG (50000 UNIT) CAPS capsule Take 1 capsule (50,000 Units total) by mouth every 7 (seven) days.  . [DISCONTINUED] blood glucose meter kit and supplies Dispense based on patient and insurance preference. Use up to two times daily as directed. (FOR ICD-10 E10.9, E11.9). 1 year supplies  . [DISCONTINUED] spironolactone (ALDACTONE) 50 MG tablet Take 1 tablet (50 mg total) by mouth daily.   Allergies  Allergen Reactions  . Penicillins Rash   Recent Results (from the past 2160 hour(s))  Comprehensive metabolic panel     Status: Abnormal   Collection Time: 04/13/20  9:01 AM  Result Value Ref Range   Glucose 146 (H) 65 - 99 mg/dL   BUN 14 6 - 24 mg/dL   Creatinine, Ser 0.74 0.57 - 1.00 mg/dL   eGFR 104 >59 mL/min/1.73   BUN/Creatinine Ratio 19 9 - 23   Sodium 138 134 - 144 mmol/L   Potassium 4.5 3.5 - 5.2 mmol/L   Chloride 98 96 - 106 mmol/L   CO2 23 20 - 29 mmol/L   Calcium 9.4 8.7 - 10.2 mg/dL   Total Protein 7.5  6.0 - 8.5 g/dL   Albumin 4.3 3.8 - 4.8 g/dL   Globulin, Total 3.2 1.5 - 4.5 g/dL   Albumin/Globulin Ratio 1.3 1.2 - 2.2   Bilirubin Total 0.4 0.0 - 1.2 mg/dL   Alkaline Phosphatase 59 44 -  121 IU/L   AST 20 0 - 40 IU/L   ALT 21 0 - 32 IU/L  Folate     Status: None   Collection Time: 04/13/20  9:01 AM  Result Value Ref Range   Folate >20.0 >3.0 ng/mL    Comment: A serum folate concentration of less than 3.1 ng/mL is considered to represent clinical deficiency.   Hemoglobin A1c     Status: Abnormal   Collection Time: 04/13/20  9:01 AM  Result Value Ref Range   Hgb A1c MFr Bld 7.7 (H) 4.8 - 5.6 %    Comment:          Prediabetes: 5.7 - 6.4          Diabetes: >6.4          Glycemic control for adults with diabetes: <7.0    Est. average glucose Bld gHb Est-mCnc 174 mg/dL  Insulin, random     Status: None   Collection Time: 04/13/20  9:01 AM  Result Value Ref Range   INSULIN 24.1 2.6 - 24.9 uIU/mL  Lipid Panel With LDL/HDL Ratio     Status: Abnormal   Collection Time: 04/13/20  9:01 AM  Result Value Ref Range   Cholesterol, Total 180 100 - 199 mg/dL   Triglycerides 152 (H) 0 - 149 mg/dL   HDL 52 >39 mg/dL   VLDL Cholesterol Cal 27 5 - 40 mg/dL   LDL Chol Calc (NIH) 101 (H) 0 - 99 mg/dL   LDL/HDL Ratio 1.9 0.0 - 3.2 ratio    Comment:                                     LDL/HDL Ratio                                             Men  Women                               1/2 Avg.Risk  1.0    1.5                                   Avg.Risk  3.6    3.2                                2X Avg.Risk  6.2    5.0                                3X Avg.Risk  8.0    6.1   Thyroid Panel With TSH     Status: None   Collection Time: 04/13/20  9:01 AM  Result Value Ref Range   TSH 1.490 0.450 - 4.500 uIU/mL   T4, Total 8.6 4.5 - 12.0 ug/dL   T3 Uptake Ratio 30 24 - 39 %   Free Thyroxine Index 2.6 1.2 - 4.9  VITAMIN D 25 Hydroxy (Vit-D Deficiency, Fractures)     Status: Abnormal   Collection  Time: 04/13/20  9:01 AM  Result Value Ref Range  Vit D, 25-Hydroxy 25.7 (L) 30.0 - 100.0 ng/mL    Comment: Vitamin D deficiency has been defined by the [facility] practice guideline as a level of serum 25-OH vitamin D less than 20 ng/mL (1,2). The Endocrine Society went on to further define vitamin D insufficiency as a level between 21 and 29 ng/mL (2). 1. IOM (Institute of Medicine). 2010. Dietary reference    intakes for calcium and D. [place]: The    [employer]. 2. Holick MF, Binkley Wall, Bischoff-Ferrari HA, et al.    Evaluation, treatment, and prevention of vitamin D    deficiency: an Endocrine Society clinical practice    guideline. JCEM. 2011 Jul; 96(7):1911-30.   CBC w/Diff/Platelet     Status: Abnormal   Collection Time: 04/13/20  9:01 AM  Result Value Ref Range   WBC 6.2 3.4 - 10.8 x10E3/uL   RBC 5.41 (H) 3.77 - 5.28 x10E6/uL   Hemoglobin 12.5 11.1 - 15.9 g/dL   MCV 76 (L) 79 - 97 fL   MCH 23.1 (L) 26.6 - 33.0 pg   MCHC 30.3 (L) 31.5 - 35.7 g/dL   RDW 14.7 11.7 - 15.4 %   Platelets 240 150 - 450 x10E3/uL   Neutrophils 60 Not Estab. %   Lymphs 25 Not Estab. %   Monocytes 12 Not Estab. %   Eos 1 Not Estab. %   Basos 1 Not Estab. %   Neutrophils Absolute 3.8 1.4 - 7.0 x10E3/uL   Lymphocytes Absolute 1.5 0.7 - 3.1 x10E3/uL   Monocytes Absolute 0.8 0.1 - 0.9 x10E3/uL   EOS (ABSOLUTE) 0.0 0.0 - 0.4 x10E3/uL   Basophils Absolute 0.0 0.0 - 0.2 x10E3/uL   Immature Granulocytes 1 Not Estab. %   Immature Grans (Abs) 0.0 0.0 - 0.1 x10E3/uL  Anemia panel     Status: Abnormal   Collection Time: 04/13/20  9:01 AM  Result Value Ref Range   Total Iron Binding Capacity 334 250 - 450 ug/dL   UIBC 257 131 - 425 ug/dL   Iron 77 27 - 159 ug/dL   Iron Saturation 23 15 - 55 %   Vitamin B-12 1,472 (H) 232 - 1,245 pg/mL   Folate, Hemolysate 410.0 Not Estab. ng/mL   Hematocrit 41.3 34.0 - 46.6 %   Folate, RBC 993 >498 ng/mL    Ferritin 88 15 - 150 ng/mL   Retic Ct Pct 1.6 0.6 - 2.6 %  Prealbumin     Status: None   Collection Time: 04/13/20  9:01 AM  Result Value Ref Range   PREALBUMIN 27 12 - 34 mg/dL  POCT Glucose (CBG)     Status: Abnormal   Collection Time: 04/30/20  8:27 AM  Result Value Ref Range   POC Glucose 146 (A) 70 - 99 mg/dl   Objective  Body mass index is 54.85 kg/m. Wt Readings from Last 3 Encounters:  04/30/20 (!) 329 lb 9.6 oz (149.5 kg)  04/27/20 (!) 331 lb (150.1 kg)  04/13/20 (!) 328 lb (148.8 kg)   Temp Readings from Last 3 Encounters:  04/30/20 97.7 F (36.5 C) (Oral)  04/27/20 98 F (36.7 C)  04/13/20 98 F (36.7 C)   BP Readings from Last 3 Encounters:  04/30/20 122/86  04/27/20 116/79  04/13/20 129/89   Pulse Readings from Last 3 Encounters:  04/30/20 62  04/27/20 70  04/13/20 79    Physical Exam Vitals and nursing note reviewed.  Constitutional:  Appearance: Normal appearance. She is well-developed and well-groomed. She is morbidly obese.  HENT:     Head: Normocephalic and atraumatic.  Eyes:     Conjunctiva/sclera: Conjunctivae normal.     Pupils: Pupils are equal, round, and reactive to light.  Cardiovascular:     Rate and Rhythm: Normal rate and regular rhythm.     Heart sounds: Normal heart sounds. No murmur heard.   Pulmonary:     Effort: Pulmonary effort is normal.     Breath sounds: Normal breath sounds.  Skin:    General: Skin is warm and dry.  Neurological:     General: No focal deficit present.     Mental Status: She is alert and oriented to person, place, and time. Mental status is at baseline.     Gait: Gait normal.  Psychiatric:        Attention and Perception: Attention and perception normal.        Mood and Affect: Mood and affect normal.        Speech: Speech normal.        Behavior: Behavior normal. Behavior is cooperative.        Thought Content: Thought content normal.        Cognition and Memory: Cognition and memory normal.         Judgment: Judgment normal.     Assessment  Plan  Hypertension associated with diabetes (HCC) norvasc 10 mg qd, chlorthalidone 25 mg qd, losartan 50 mg qd and spironolactone 25 mg qd  LVH (left ventricular hypertrophy) - Plan: ECHOCARDIOGRAM COMPLETE Abnormal EKG - Plan: ECHOCARDIOGRAM COMPLETE  Adjustment reaction with anxiety and depression - Plan: Ambulatory referral to Psychology [facility] Grief - Plan: Ambulatory referral to Psychology  Neck pain - Plan: DG Cervical Spine Complete  Low mean corpuscular volume (MCV)  Consider hematology in further w/u   HM Declines flu shot covid 2/2 had pfizer consider 3rd dose Tdap in future Check hep B statusin future currently pt w/o insurance  HIV neg 10/22/10 declines STD check Hep C declines   LMP 2012 s/p hysterectomy both ovaries removed previously -CT ab/pelvis 10/19/10 c/w complex cystic mass in uterus with elevated CA 125 -per path report removal uterus, b/l fallopian tubes and cervix neg malignancy or dysplasia 10/2010 Dr. Daiva Huge   Former smoker  rec take Mvt with iron  mammo due2/16/2021[facility] negative ordered  Egd/colonoscopy 01/01/19 diverticulosis, EGD no bxs f/u in 10 years colonos. She also has  rec healthy diet and exercise    Provider: Dr. Olivia Mackie McLean-Scocuz[facility]

## 2020-04-30 NOTE — Patient Instructions (Signed)
We will call your for echo at [facility] when you go please do the Xray of your neck  [facility] [address]

## 2020-05-05 ENCOUNTER — Telehealth: Payer: Self-pay | Admitting: Internal Medicine

## 2020-05-05 NOTE — Telephone Encounter (Signed)
Can see call [facility] in [place] [address] please give # number

## 2020-05-05 NOTE — Telephone Encounter (Signed)
Left pt a vm regarding [facility] not participating with [facility] at this time. And to call ofc once she contact ins to inquire who she can see.Thanks

## 2020-05-06 NOTE — Telephone Encounter (Signed)
Patient was returning call about referral 

## 2020-05-07 NOTE — Telephone Encounter (Signed)
Called Patient and informed of the below. She will call her insurance and see who they cover then call back in.

## 2020-05-24 ENCOUNTER — Ambulatory Visit (INDEPENDENT_AMBULATORY_CARE_PROVIDER_SITE_OTHER): Payer: 59 | Admitting: Family Medicine

## 2020-05-24 ENCOUNTER — Other Ambulatory Visit: Payer: Self-pay

## 2020-05-24 ENCOUNTER — Encounter (INDEPENDENT_AMBULATORY_CARE_PROVIDER_SITE_OTHER): Payer: Self-pay | Admitting: Family Medicine

## 2020-05-24 VITALS — BP 125/77 | HR 60 | Temp 98.4°F | Ht 65.0 in | Wt 321.0 lb

## 2020-05-24 DIAGNOSIS — E559 Vitamin D deficiency, unspecified: Secondary | ICD-10-CM | POA: Diagnosis not present

## 2020-05-24 DIAGNOSIS — E1165 Type 2 diabetes mellitus with hyperglycemia: Secondary | ICD-10-CM | POA: Diagnosis not present

## 2020-05-24 DIAGNOSIS — Z9189 Other specified personal risk factors, not elsewhere classified: Secondary | ICD-10-CM

## 2020-05-24 DIAGNOSIS — Z6841 Body Mass Index (BMI) 40.0 and over, adult: Secondary | ICD-10-CM

## 2020-05-24 MED ORDER — VITAMIN D (ERGOCALCIFEROL) 1.25 MG (50000 UNIT) PO CAPS: 50000.0000 [IU] | ORAL_CAPSULE | ORAL | 0 refills | Status: DC

## 2020-05-24 MED ORDER — OZEMPIC (0.25 OR 0.5 MG/DOSE) 2 MG/1.5ML ~~LOC~~ SOPN: 0.2500 mg | PEN_INJECTOR | SUBCUTANEOUS | 0 refills | Status: DC

## 2020-05-24 MED ORDER — METFORMIN HCL 500 MG PO TABS: 500.0000 mg | ORAL_TABLET | Freq: Every day | ORAL | 0 refills | Status: DC

## 2020-05-25 NOTE — Progress Notes (Signed)
Chief Complaint:   OBESITY Maria Quinn is here to discuss her progress with her obesity treatment plan along with follow-up of her obesity related diagnoses. Maria Quinn is on the Pescatarian Plan+ 400 calories and states she is following her eating plan approximately 70% of the time. Maria Quinn states she is not currently exercising.  Today's visit was #: 3 Starting weight: 328 lbs Starting date: 04/13/2020 Today's weight: 321 lbs Today's date: 05/24/2020 Total lbs lost to date: 7 Total lbs lost since last in-office visit: 10  Interim History: Maria Quinn is trying to adhere to the plan as much as she could but sometimes struggles to get in all food. She did start Ozempic. Breakfast is plant based sausage patties, 2 eggs, 2 slices of delightful bread. Lunch has been salad ort una. Dinner is salmon with vegetables. Nut thins for snacks with laughing cow cheese and protein shakes. Skipping a meal 3 times a week.  Subjective:   1. Vitamin D deficiency Maria Quinn denies nausea, vomiting, and muscle weakness but notes fatigue. Pt is on prescription Vit D. Her Vit D level was 25.7 at last check.  2. Type 2 diabetes mellitus with hyperglycemia, without long-term current use of insulin (HCC) Maria Quinn is on Metformin and Ozempic. She reports some side effects the day after taking medication.  3. At risk for deficient intake of food Maria Quinn is at risk for deficient intake of food due to frequently skipping meals.  Assessment/Plan:   1. Vitamin D deficiency Low Vitamin D level contributes to fatigue and are associated with obesity, breast, and colon cancer. She agrees to continue to take prescription Vitamin D @50 ,000 IU every week and will follow-up for routine testing of Vitamin D, at least 2-3 times per year to avoid over-replacement. - Vitamin D, Ergocalciferol, (DRISDOL) 1.25 MG (50000 UNIT) CAPS capsule; Take 1 capsule (50,000 Units total) by mouth every 7 (seven) days.  Dispense: 4 capsule; Refill: 0  2. Type 2  diabetes mellitus with hyperglycemia, without long-term current use of insulin (HCC) Good blood sugar control is important to decrease the likelihood of diabetic complications such as nephropathy, neuropathy, limb loss, blindness, coronary artery disease, and death. Intensive lifestyle modification including diet, exercise and weight loss are the first line of treatment for diabetes.   - Semaglutide,0.25 or 0.5MG /DOS, (OZEMPIC, 0.25 OR 0.5 MG/DOSE,) 2 MG/1.5ML SOPN; Inject 0.25 mg into the skin once a week.  Dispense: 1.5 mL; Refill: 0 - metFORMIN (GLUCOPHAGE) 500 MG tablet; Take 1 tablet (500 mg total) by mouth daily with breakfast.  Dispense: 30 tablet; Refill: 0  3. At risk for deficient intake of food Maria Quinn was given approximately 15 minutes of deficit intake of food prevention counseling today. Maria Quinn is at risk for eating too few calories based on current food recall. She was encouraged to focus on meeting caloric and protein goals according to her recommended meal plan.   4. Class 3 severe obesity with serious comorbidity and body mass index (BMI) of 50.0 to 59.9 in adult, unspecified obesity type (HCC)  Maria Quinn is currently in the action stage of change. As such, her goal is to continue with weight loss efforts. She has agreed to the + 400 calories.   Exercise goals: No exercise has been prescribed at this time.  Behavioral modification strategies: increasing lean protein intake, meal planning and cooking strategies, keeping healthy foods in the home and planning for success.  Maria Quinn has agreed to follow-up with our clinic in 2-3 weeks. She was  informed of the importance of frequent follow-up visits to maximize her success with intensive lifestyle modifications for her multiple health conditions.   Objective:   Blood pressure 125/77, pulse 60, temperature 98.4 F (36.9 C), height 5\' 5"  (1.651 m), weight (!) 321 lb (145.6 kg), last menstrual period 10/19/2010, SpO2 97  %. Body mass index is 53.42 kg/m.  General: Cooperative, alert, well developed, in no acute distress. HEENT: Conjunctivae and lids unremarkable. Cardiovascular: Regular rhythm.  Lungs: Normal work of breathing. Neurologic: No focal deficits.   Lab Results  Component Value Date   CREATININE 0.74 04/13/2020   BUN 14 04/13/2020   NA 138 04/13/2020   K 4.5 04/13/2020   CL 98 04/13/2020   CO2 23 04/13/2020   Lab Results  Component Value Date   ALT 21 04/13/2020   AST 20 04/13/2020   ALKPHOS 59 04/13/2020   BILITOT 0.4 04/13/2020   Lab Results  Component Value Date   HGBA1C 7.7 (H) 04/13/2020   HGBA1C 6.4 01/29/2018   Lab Results  Component Value Date   INSULIN 24.1 04/13/2020   Lab Results  Component Value Date   TSH 1.490 04/13/2020   Lab Results  Component Value Date   CHOL 180 04/13/2020   HDL 52 04/13/2020   LDLCALC 101 (H) 04/13/2020   TRIG 152 (H) 04/13/2020   CHOLHDL 3 01/29/2018   Lab Results  Component Value Date   WBC 6.2 04/13/2020   HGB 12.5 04/13/2020   HCT 41.3 04/13/2020   MCV 76 (L) 04/13/2020   PLT 240 04/13/2020   Lab Results  Component Value Date   IRON 77 04/13/2020   TIBC 334 04/13/2020   FERRITIN 88 04/13/2020     Attestation Statements:   Reviewed by clinician on day of visit: allergies, medications, problem list, medical history, surgical history, family history, social history, and previous encounter notes.  06/13/2020, CMA, am acting as transcriptionist for Edmund Hilda, MD.   I have reviewed the above documentation for accuracy and completeness, and I agree with the above. - Reuben Likes, MD

## 2020-05-31 ENCOUNTER — Encounter: Payer: Self-pay | Admitting: Neurology

## 2020-05-31 ENCOUNTER — Other Ambulatory Visit: Payer: Self-pay

## 2020-05-31 ENCOUNTER — Ambulatory Visit: Payer: 59 | Admitting: Neurology

## 2020-05-31 VITALS — BP 122/77 | HR 82 | Ht 65.0 in | Wt 323.0 lb

## 2020-05-31 DIAGNOSIS — R519 Headache, unspecified: Secondary | ICD-10-CM

## 2020-05-31 DIAGNOSIS — G4719 Other hypersomnia: Secondary | ICD-10-CM

## 2020-05-31 DIAGNOSIS — E1159 Type 2 diabetes mellitus with other circulatory complications: Secondary | ICD-10-CM | POA: Diagnosis not present

## 2020-05-31 DIAGNOSIS — I152 Hypertension secondary to endocrine disorders: Secondary | ICD-10-CM

## 2020-05-31 DIAGNOSIS — G4733 Obstructive sleep apnea (adult) (pediatric): Secondary | ICD-10-CM

## 2020-05-31 DIAGNOSIS — F5104 Psychophysiologic insomnia: Secondary | ICD-10-CM | POA: Insufficient documentation

## 2020-05-31 DIAGNOSIS — I160 Hypertensive urgency: Secondary | ICD-10-CM

## 2020-05-31 DIAGNOSIS — K760 Fatty (change of) liver, not elsewhere classified: Secondary | ICD-10-CM

## 2020-05-31 NOTE — Progress Notes (Signed)
SLEEP MEDICINE CLINIC    Provider:  Larey Seat, MD  Primary Care Physician:  McLean-Scocuzza, Nino Glow, MD [place] [address]     Referring Provider: Weight and wellness,  Dr. Theone Murdoch       Chief Complaint according to patient   Patient presents with:    . New Patient (Initial Visit)           HISTORY OF PRESENT ILLNESS:  05-21-2020 Maria Quinn is a 40- year- old African American female patient  of Dr. Sharlyne Cai with whom she had 3 visits thus far- seen here as a referral on 05/31/2020.  Chief concern according to patient :  " I gained a lot of weight over the years and sleep poorly- I feel fatigued, sleepy and never rested- I wake every night at 3 AM and can't get to sleep until it's 4.30 - it is hard in combination with my job. 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 "   I have the pleasure of seeing Maria Quinn on 03-31-2020, a left-handed  African American female who  has a past medical history of Anemia, Anxiety, Arthritis, Back pain, Chicken pox, Depression, Exocrine pancreatic insufficiency, Gastric bypass for obesity complicating pregnancy, birth, puerperium, Hyperlipidemia, Hypertension associated with diabetes ([place]), OSA (obstructive sleep apnea) before weight loss surgery - had CPAP - unable to sleep with the CPAP - and went for baratric surgery, Roux and Y - Gastric bypass, by Dr. Boykin Reaper out of [place], [place].12/ 2010 , I  Lost weight very quickly- but got pregnant and sick, I lost the baby and had a hysterectomy- and ended up stress eating.  Other fatigue, Pre-diabetes, Rheumatoid arthritis (HCC), SOB (shortness of breath) on exertion, Swelling of both lower extremities, and Vitamin D deficiency.   The patient had the first sleep study in the year 2009  with a result of severe OSA.   Sleep relevant medical history: Nocturia, Insomnia , Sleep walking, nightmares, Tonsillectomy +adenoid ectomy at age 59,  cervical spine  tension pain.  Family medical /sleep history: Mother likely has OSA, hypersomnia.   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 Family status is single. The patient currently works/ used to work in shifts( night/ rotating,) Tobacco use- none   ETOH use - wine, more than 4 a week.  Caffeine intake in form of Tea ( 1-2 month). Regular exercise in form of  Dancing.    Sleep habits are as follows: The patient's dinner time is between 6.30-8 PM. The patient goes to bed at 12 PM and struggles to fall asleep and stay asleep, she  continues to sleep for intervals of 1-2  hours, wakes for 2 bathroom breaks, the first time at 3 AM.   The preferred sleep position is prone and lateral, with the support of 3 pillows.  Dreams are reportedly frequent.  7 AM is the usual rise time, but she struggles . The patient wakes up with 4 alarms.  She reports not feeling refreshed or restored in AM, with symptoms such as stiffness,  morning headaches , and residual fatigue.  Naps are taken infrequently.    Review of Systems: Out of a complete 14 system review, the patient complains of only the following symptoms, and all other reviewed systems are negative.:  Fatigue, sleepiness , snoring,  fragmented sleep, Insomnia - unable to sleep in complete darkness, fear of nightmares.    How likely are you to doze in the following situations: 0 = not likely, 1 = slight chance, 2 = moderate chance, 3 = high chance   Sitting and Reading? Watching Television? Sitting inactive in a public place (theater or meeting)? As a passenger in a car for an hour without a break? Lying down in the afternoon when circumstances permit? Sitting and talking to someone? Sitting quietly after lunch without alcohol? In a car, while stopped for a few minutes in traffic?   Total = 10/ 24 points   Yawning all day  (!)   FSS endorsed at  38/ 63 points.  Blurred vision.   morning headaches have improved some on meds.  Hands tingle and legs are cramping.   Social History   Socioeconomic History  . Marital status: Single    Spouse name: Not on file  . Number of children: Not on file  . Years of education: Not on file  . Highest education level: Not on file  Occupational History  . Occupation: Scientist, product/process development  Tobacco Use  . Smoking status: Never Smoker  . Smokeless tobacco: Never Used  Vaping Use  . Vaping Use: Never used  Substance and Sexual Activity  . Alcohol use: Yes    Alcohol/week: 2.0 - 3.0 standard drinks    Types: 2 - 3 Glasses of wine per week  . Drug use: No  . Sexual activity: Yes    Comment: men  Other Topics Concern  . 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 Depression Mother   . Diabetes Mother   . Hypertension Mother   . Miscarriages / [place] Mother   . Cancer Father        prostate.kidney  . Diabetes Father   . Heart disease Father   . Hyperlipidemia Father   . Hypertension Father   . Kidney disease Father   . Stroke Father   . Depression Sister   . Alcohol abuse Brother   . Cancer Brother        lung   . Drug abuse Brother   . Arthritis Maternal Grandmother   . Diabetes Maternal Grandmother   . Hypertension Maternal Grandmother   . Stroke Maternal Grandmother   . Alcohol abuse Maternal Grandfather   . Stroke Paternal Grandmother   . Cancer Paternal Grandfather        ? type  .  Alcohol abuse Brother   . Early death Brother     Past Medical History:  Diagnosis Date  . Anemia   . Anxiety   . Arthritis   . Back pain   . Chicken pox   . Depression   . Exocrine pancreatic insufficiency   . Gastric bypass for obesity complicating pregnancy, birth, puerperium   . Hyperlipidemia   .  Hypertension   . Hypertension associated with diabetes ([place])   . OSA (obstructive sleep apnea)   . Other fatigue   . Pre-diabetes   . Rheumatoid arthritis ([place])   . Sleep apnea   . SOB (shortness of breath) on exertion   . Swelling of both lower extremities   . Vitamin D deficiency     Past Surgical History:  Procedure Laterality Date  . ABDOMINAL HYSTERECTOMY     Dr. Daiva Huge 2012   . COLONOSCOPY WITH PROPOFOL N/A 01/01/2019   Procedure: COLONOSCOPY WITH PROPOFOL;  Surgeon: Virgel Manifold, MD;  Location: [facility] ENDOSCOPY;  Service: Endoscopy;  Laterality: N/A;  . ESOPHAGOGASTRODUODENOSCOPY (EGD) WITH PROPOFOL N/A 01/01/2019   Procedure: ESOPHAGOGASTRODUODENOSCOPY (EGD) WITH PROPOFOL;  Surgeon: Virgel Manifold, MD;  Location: [facility] ENDOSCOPY;  Service: Endoscopy;  Laterality: N/A;  . OOPHORECTOMY     40 y.o 1 ovary out per pt, later in life 2nd ovary out   . ROUX-EN-Y GASTRIC BYPASS     2010 losst down to 180s gained back   . TONSILLECTOMY     1987     Current Outpatient Medications on File Prior to Visit  Medication Sig Dispense Refill  . amLODipine (NORVASC) 10 MG tablet Take 1 tablet (10 mg total) by mouth daily. 90 tablet 3  . Ascorbic Acid (VITAMIN C PO) Take 1,000 mg by mouth daily.    . blood glucose meter kit and supplies Dispense based on patient and insurance preference. Use up to two times daily as directed. (FOR ICD-10 E10.9, E11.9). 1 year supplies 1 each 0  . chlorthalidone (HYGROTON) 25 MG tablet Take 1 tablet (25 mg total) by mouth daily. 90 tablet 3  . Cholecalciferol (VITAMIN D) 50 MCG (2000 UT) CAPS Take by mouth daily.    Marland Kitchen losartan (COZAAR) 50  MG tablet Take 1 tablet (50 mg total) by mouth daily. At night 90 tablet 3  . metFORMIN (GLUCOPHAGE) 500 MG tablet Take 1 tablet (500 mg total) by mouth daily with breakfast. 30 tablet 0  . Multiple Vitamins-Minerals (CENTRUM WOMEN PO) Take by mouth.    Marland Kitchen POTASSIUM PO Take by mouth.    . Semaglutide,0.25 or 0.5MG/DOS, (OZEMPIC, 0.25 OR 0.5 MG/DOSE,) 2 MG/1.5ML SOPN Inject 0.25 mg into the skin once a week. 1.5 mL 0  . spironolactone (ALDACTONE) 50 MG tablet Take 0.5 tablets (25 mg total) by mouth daily. 90 tablet 3  . Vitamin D, Ergocalciferol, (DRISDOL) 1.25 MG (50000 UNIT) CAPS capsule Take 1 capsule (50,000 Units total) by mouth every 7 (seven) days. 4 capsule 0   No current facility-administered medications on file prior to visit.    Allergies  Allergen Reactions  . Penicillins Rash    Physical exam:  Today's Vitals   05/31/20 0953  BP: 122/77  Pulse: 82  Weight: (!) 323 lb (146.5 kg)  Height: 5' 5" (1.651 m)   Body mass index is 53.75 kg/m.   Wt Readings from Last 3 Encounters:  05/31/20 (!) 323 lb (146.5 kg)  05/24/20 (!) 321 lb (145.6 kg)  04/30/20 (!) 329 lb 9.6 oz (149.5 kg)     Ht Readings from Last 3 Encounters:  05/31/20 5' 5" (1.651 m)  05/24/20 5' 5" (1.651 m)  04/30/20 5' 5" (1.651 m)      General: The patient is awake, alert and appears not in acute distress. The patient is well groomed. Head: Normocephalic, atraumatic. Neck is supple. Mallampati 2 , short and stubby uvula.  neck circumference:16  inches . Nasal airflow  patent.  Retrognathia is not seen.  Dental status: intact  Cardiovascular:  Regular rate and cardiac rhythm by pulse,  without distended neck veins. Respiratory: Lungs are clear to auscultation.  Skin:  Without evidence of ankle edema, or rash. Trunk: The patient's posture is erect.   Neurologic exam : The patient is awake and alert, oriented to place and time.   Memory subjective described as intact.  Attention span & concentration  ability appears normal.  Speech is fluent,  without  dysarthria, dysphonia or aphasia.  Mood and affect are appropriate.   Cranial nerves: no loss of smell or taste reported  Pupils are equal and briskly reactive to light. Funduscopic exam deferred. .  Extraocular movements in vertical and horizontal planes were intact and without nystagmus. No Diplopia. Visual fields by finger perimetry are intact. Hearing was intact to soft voice and finger rubbing.    Facial sensation intact to fine touch.  Facial motor strength is symmetric and tongue and uvula move midline.  Neck ROM : rotation, tilt and flexion extension were normal for age and shoulder shrug was symmetrical.    Motor exam:  Symmetric bulk, tone and ROM.   Normal tone without cog-wheeling, symmetric grip strength .   Sensory:  Fine touch  and vibration were tested  and  normal.  Proprioception tested in the upper extremities was normal.   Coordination: Rapid alternating movements in the fingers/hands were of normal speed.  The Finger-to-nose maneuver was intact without evidence of ataxia, dysmetria or tremor.   Gait and station: Patient could rise unassisted from a seated position, walked without assistive device.  Stance is of wider base and the patient turned with 3 steps.  Toe and heel walk were deferred.  Deep tendon reflexes: in the  upper and lower extremities are symmetrically attenuated and intact.  Babinski response was deferred .      After spending a total time of 45 minutes face to face and additional time for physical and neurologic examination, review of laboratory studies,  personal review of imaging studies, reports and results of other testing and review of referral information / records as far as provided in visit, I have established the following assessments:  1)  Patient with a tragic history of pregnancy loss and reactive overeating, regaining all the weight she had lost with bariatric surgery. Was diagnosed  with severe OSA before weight loss surgery and has now reached again BMI of 50 plus.  2) morbid obesity, DM, HTN, Hyperlipidemia. She has reported RLS and leg cramping at night. This may be related to DM neuropathy. Hands tingling at night.  3) Patient with restricted sleep time, non-restorative sleep, EDS  and high degree of fatigue.    My Plan is to proceed with:  1) I will order a HST for this patient,  2) I recommend to follow up with a  EMG / NCV cervical CT to rule out cervical radiculopathy after we meet to discuss HST.  Continue with pescatarian diet.   I would like to thank McLean-Scocuzza, Nino Glow, MD and Mclean-scocuzza, Nino Glow, Md [place],  [place] [address] for allowing me to meet with and to take care of this pleasant patient.   In short, Maria Quinn is presenting with neck spasms, leg spasms, morning headaches and EDS-  a symptom that can be attributed to OSA and morbid obesity.  I plan to follow up either personally or through our NP within  3-4 month.     Electronically signed by: Larey Seat, MD 05/31/2020 10:01 AM  [facility] Board certified by The [employer] of Sleep Medicine and Diplomate of the [employer] of Sleep Medicine. Board certified In Neurology through the [place], Fellow of the [employer] of Neurology. Medical Director of [facility].

## 2020-05-31 NOTE — Patient Instructions (Signed)

## 2020-06-19 ENCOUNTER — Encounter (INDEPENDENT_AMBULATORY_CARE_PROVIDER_SITE_OTHER): Payer: Self-pay

## 2020-06-21 ENCOUNTER — Ambulatory Visit (INDEPENDENT_AMBULATORY_CARE_PROVIDER_SITE_OTHER): Payer: 59 | Admitting: Family Medicine

## 2020-07-13 ENCOUNTER — Encounter (INDEPENDENT_AMBULATORY_CARE_PROVIDER_SITE_OTHER): Payer: Self-pay | Admitting: Family Medicine

## 2020-07-13 ENCOUNTER — Ambulatory Visit (INDEPENDENT_AMBULATORY_CARE_PROVIDER_SITE_OTHER): Payer: 59 | Admitting: Family Medicine

## 2020-07-13 ENCOUNTER — Other Ambulatory Visit: Payer: Self-pay

## 2020-07-13 VITALS — BP 142/93 | HR 67 | Temp 98.2°F | Ht 65.0 in | Wt 331.0 lb

## 2020-07-13 DIAGNOSIS — E1165 Type 2 diabetes mellitus with hyperglycemia: Secondary | ICD-10-CM

## 2020-07-13 DIAGNOSIS — Z9189 Other specified personal risk factors, not elsewhere classified: Secondary | ICD-10-CM

## 2020-07-13 DIAGNOSIS — Z6841 Body Mass Index (BMI) 40.0 and over, adult: Secondary | ICD-10-CM

## 2020-07-13 DIAGNOSIS — E559 Vitamin D deficiency, unspecified: Secondary | ICD-10-CM

## 2020-07-13 MED ORDER — VITAMIN D (ERGOCALCIFEROL) 1.25 MG (50000 UNIT) PO CAPS: 50000.0000 [IU] | ORAL_CAPSULE | ORAL | 0 refills | Status: DC

## 2020-07-19 NOTE — Progress Notes (Signed)
Chief Complaint:   OBESITY Maria Quinn is here to discuss her progress with her obesity treatment plan along with follow-up of her obesity related diagnoses. Maria Quinn is on the [employer] and states she is following her eating plan approximately 40% of the time. Maria Quinn states she is dancing 180 minutes 6 times per week.  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 Pt missed her last appt due to provider being out with COVID. Every Friday and Saturday until August, she is performing. She was eating out more frequently. She didn't eat all the food on plan and is often not eating most of the day.  Subjective:   1. Type 2 diabetes mellitus with hyperglycemia, without long-term current use of insulin (HCC) Maria Quinn stopped Metformin due to GI side effects of cramping and diarrhea.   2. Vitamin D deficiency Maria Quinn is on prescription Vit D. She reports fatigue.  3. At risk for osteoporosis Maria Quinn is at higher risk of osteopenia and osteoporosis due to Vitamin D deficiency.   Assessment/Plan:   1. Type 2 diabetes mellitus with hyperglycemia, without long-term current use of insulin (HCC) Good blood sugar control is important to decrease the likelihood of diabetic complications such as nephropathy, neuropathy, limb loss, blindness, coronary artery disease, and death. Intensive lifestyle modification including diet, exercise and weight loss are the first line of treatment for diabetes. Continue Ozempic and increase to 0.5 mg weekly. No refill needed at this time.  2. Vitamin D deficiency Low Vitamin D level contributes to fatigue and are associated with obesity, breast, and colon cancer. She agrees to continue to take prescription Vitamin D @50 ,000 IU  every week and will follow-up for routine testing of Vitamin D, at least 2-3 times per year to avoid over-replacement.  Refill- Vitamin D, Ergocalciferol, (DRISDOL) 1.25 MG (50000 UNIT) CAPS capsule; Take 1 capsule (50,000 Units total) by mouth every 7 (seven) days.  Dispense: 4 capsule; Refill: 0  3. At risk for osteoporosis Maria Quinn was given approximately 15 minutes of osteoporosis prevention counseling today. Maria Quinn is at risk for osteopenia and osteoporosis due to her Vitamin D deficiency. She was encouraged to take her Vitamin D and follow her higher calcium diet and increase strengthening exercise to help strengthen her bones and decrease her risk of osteopenia and osteoporosis.  Repetitive spaced learning was employed today to elicit superior memory formation and behavioral change.  4. Class 3 severe obesity with serious comorbidity and body mass index (BMI) of 50.0 to 59.9 in adult, unspecified obesity type (HCC)  Maria Quinn is currently in the action stage of change. As such, her goal is to continue with weight loss efforts. She has agreed to keeping a food journal and adhering to recommended goals of 1600-1700 calories and 95+ g protein and the Pescatarian Plan + 400 calories.   Exercise goals: No exercise has been prescribed at this time.  Behavioral modification strategies: increasing lean protein intake, decreasing eating out, meal planning and cooking strategies, and keeping healthy foods in the home.  Maria Quinn has agreed to follow-up with our clinic in 2 weeks. She was informed of the importance of frequent follow-up visits to maximize her success with intensive lifestyle modifications for her multiple health conditions.   Objective:  Blood pressure (!) 142/93, pulse 67, temperature 98.2 F (36.8 C), height 5\' 5"  (1.651 m), weight (!) 331 lb (150.1 kg), last menstrual period 10/19/2010, SpO2 97 %. Body mass index is 55.08 kg/m.  General: Cooperative, alert, well developed, in no acute  distress. HEENT: Conjunctivae and lids unremarkable. Cardiovascular: Regular rhythm.  Lungs: Normal work of breathing. Neurologic: No focal deficits.   Lab Results  Component Value Date   CREATININE 0.74 04/13/2020   BUN 14 04/13/2020   NA 138 04/13/2020   K 4.5 04/13/2020   CL 98 04/13/2020   CO2 23 04/13/2020   Lab Results  Component Value Date   ALT 21 04/13/2020   AST 20 04/13/2020   ALKPHOS 59 04/13/2020   BILITOT 0.4 04/13/2020   Lab Results  Component Value Date   HGBA1C 7.7 (H) 04/13/2020   HGBA1C 6.4 01/29/2018   Lab Results  Component Value Date   INSULIN 24.1 04/13/2020   Lab Results  Component Value Date   TSH 1.490 04/13/2020   Lab Results  Component Value Date   CHOL 180 04/13/2020   HDL 52 04/13/2020   LDLCALC 101 (H) 04/13/2020   TRIG 152 (H) 04/13/2020   CHOLHDL 3 01/29/2018   Lab Results  Component Value Date   VD25OH 25.7 (L) 04/13/2020   Lab Results  Component Value Date   WBC 6.2 04/13/2020   HGB 12.5 04/13/2020   HCT 41.3 04/13/2020   MCV 76 (L) 04/13/2020   PLT 240 04/13/2020   Lab Results  Component Value Date   IRON 77 04/13/2020   TIBC 334 04/13/2020   FERRITIN 88 04/13/2020   Attestation Statements:   Reviewed by clinician on day of visit: allergies, medications, problem list, medical history, surgical history, family history, social history, and previous encounter notes.  06/13/2020, CMA, am acting as transcriptionist for Edmund Hilda, MD.   I have reviewed the above documentation for accuracy and completeness, and I agree with the above. - Reuben Likes, MD

## 2020-07-28 ENCOUNTER — Ambulatory Visit (INDEPENDENT_AMBULATORY_CARE_PROVIDER_SITE_OTHER): Payer: 59 | Admitting: Family Medicine

## 2020-07-29 ENCOUNTER — Other Ambulatory Visit (INDEPENDENT_AMBULATORY_CARE_PROVIDER_SITE_OTHER): Payer: Self-pay | Admitting: Family Medicine

## 2020-07-29 DIAGNOSIS — E1165 Type 2 diabetes mellitus with hyperglycemia: Secondary | ICD-10-CM

## 2020-07-29 DIAGNOSIS — M170 Bilateral primary osteoarthritis of knee: Secondary | ICD-10-CM | POA: Insufficient documentation

## 2020-08-02 ENCOUNTER — Encounter (INDEPENDENT_AMBULATORY_CARE_PROVIDER_SITE_OTHER): Payer: Self-pay

## 2020-08-02 NOTE — Telephone Encounter (Signed)
Dr.Ukleja 

## 2020-08-02 NOTE — Telephone Encounter (Signed)
Message sent to pt-CAS 

## 2020-08-03 NOTE — Telephone Encounter (Signed)
Ok to refill?  LAST APPOINTMENT DATE: 07/13/20 NEXT APPOINTMENT DATE: 08/09/20   [facility] [address] [place], [place] - [address] [place] [address] Phone: [phone] Fax: [phone]  Patient is requesting a refill of the following medications: Pending Prescriptions:                       Disp   Refills   metFORMIN (GLUCOPHAGE) 500 MG tablet [Phar*30 tab*0       Sig: Take 1 tablet by mouth once daily with breakfast   Date last filled: 05/24/20 Previously prescribed by Dr Lawson Radar  Lab Results      Component                Value               Date                      HGBA1C                   7.7 (H)             04/13/2020                HGBA1C                   6.4                 01/29/2018           Lab Results      Component                Value               Date                      LDLCALC                  101 (H)             04/13/2020                CREATININE               0.74                04/13/2020           Lab Results      Component                Value               Date                      VD25OH                   25.7 (L)            04/13/2020            BP Readings from Last 3 Encounters: 07/13/20 : (!) 142/93 05/31/20 : 122/77 05/24/20 : 125/77

## 2020-08-09 ENCOUNTER — Ambulatory Visit (INDEPENDENT_AMBULATORY_CARE_PROVIDER_SITE_OTHER): Payer: 59 | Admitting: Family Medicine

## 2020-08-17 ENCOUNTER — Other Ambulatory Visit (INDEPENDENT_AMBULATORY_CARE_PROVIDER_SITE_OTHER): Payer: Self-pay | Admitting: Bariatrics

## 2020-08-17 ENCOUNTER — Encounter (INDEPENDENT_AMBULATORY_CARE_PROVIDER_SITE_OTHER): Payer: Self-pay | Admitting: Bariatrics

## 2020-08-17 ENCOUNTER — Other Ambulatory Visit: Payer: Self-pay

## 2020-08-17 ENCOUNTER — Ambulatory Visit (INDEPENDENT_AMBULATORY_CARE_PROVIDER_SITE_OTHER): Payer: 59 | Admitting: Bariatrics

## 2020-08-17 VITALS — BP 122/84 | HR 72 | Temp 98.5°F | Ht 65.0 in | Wt 317.0 lb

## 2020-08-17 DIAGNOSIS — Z6841 Body Mass Index (BMI) 40.0 and over, adult: Secondary | ICD-10-CM

## 2020-08-17 DIAGNOSIS — E1169 Type 2 diabetes mellitus with other specified complication: Secondary | ICD-10-CM

## 2020-08-17 DIAGNOSIS — Z9189 Other specified personal risk factors, not elsewhere classified: Secondary | ICD-10-CM | POA: Diagnosis not present

## 2020-08-17 DIAGNOSIS — E1165 Type 2 diabetes mellitus with hyperglycemia: Secondary | ICD-10-CM

## 2020-08-17 DIAGNOSIS — E559 Vitamin D deficiency, unspecified: Secondary | ICD-10-CM | POA: Diagnosis not present

## 2020-08-17 MED ORDER — OZEMPIC (0.25 OR 0.5 MG/DOSE) 2 MG/1.5ML ~~LOC~~ SOPN: 0.2500 mg | PEN_INJECTOR | SUBCUTANEOUS | 0 refills | Status: DC

## 2020-08-17 MED ORDER — OZEMPIC (0.25 OR 0.5 MG/DOSE) 2 MG/1.5ML ~~LOC~~ SOPN: 0.5000 mg | PEN_INJECTOR | SUBCUTANEOUS | 0 refills | Status: DC

## 2020-08-17 NOTE — Patient Instructions (Signed)
Health Maintenance Due  Topic Date Due   PNEUMOCOCCAL POLYSACCHARIDE VACCINE AGE 41-64 HIGH RISK  Never done   FOOT EXAM  Never done   OPHTHALMOLOGY EXAM  Never done   TETANUS/TDAP  Never done   COVID-19 Vaccine (3 - Booster for Pfizer series) 10/14/2019   INFLUENZA VACCINE  08/09/2020    Depression screen [facility] 2/9 04/13/2020 11/18/2019 04/30/2019  Decreased Interest 1 2 0  Down, Depressed, Hopeless 1 2 0  PHQ - 2 Score 2 4 0  Altered sleeping 2 2 -  Tired, decreased energy 1 3 -  Change in appetite 1 3 -  Feeling bad or failure about yourself  1 3 -  Trouble concentrating 2 3 -  Moving slowly or fidgety/restless 0 1 -  Suicidal thoughts 0 0 -  PHQ-9 Score 9 19 -  Difficult doing work/chores Somewhat difficult Extremely dIfficult -

## 2020-08-18 NOTE — Progress Notes (Signed)
Chief Complaint:   OBESITY Maria Quinn is here to discuss her progress with her obesity treatment plan along with follow-up of her obesity related diagnoses. [name] is on keeping a food journal and adhering to recommended goals of 1600-1700 calories and 95+ grams of protein daily or the Pescatarian Plan + 400 calories and states she is following her eating plan approximately 0% of the time. Lotus states she is doing 0 minutes 0 times per week.  Today's visit was #: 5 Starting weight: 328 lbs Starting date: 04/13/2020 Today's weight: 317 lbs Today's date: 08/17/2020 Total lbs lost to date: 11 Total lbs lost since last in-office visit: 14  Interim History: [name] is down 14 lbs and she has been stressed due to a death in the family. She is getting in her water.  Subjective:   1. Type 2 diabetes mellitus with other specified complication, without long-term current use of insulin (HCC) [name] is taking metformin and Ozempic, and she denies nausea.   2. Vitamin D deficiency [name] is taking Vit D OTC.  3. At risk for side effect of medication [name] is at risk for drug side effects due to increased Ozempic dose (nausea, vomiting, constipation).  Assessment/Plan:   1. Type 2 diabetes mellitus with other specified complication, without long-term current use of insulin (HCC) [name] agreed to increase Ozempic to 0.5 mg SubQ weekly, and we will refill for 1 month. Good blood sugar control is important to decrease the likelihood of diabetic complications such as nephropathy, neuropathy, limb loss, blindness, coronary artery disease, and death. Intensive lifestyle modification including diet, exercise and weight loss are the first line of treatment for diabetes.   2. Vitamin D deficiency Low Vitamin D level contributes to fatigue and are associated with obesity, breast, and colon cancer. [name] will continue OTC Vitamin D and will follow-up for routine testing of Vitamin D, at least 2-3 times per year to  avoid over-replacement.  3. At risk for side effect of medication [name] was given approximately 15 minutes of drug side effect counseling today.  We discussed side effect possibility and risk versus benefits. [name] agreed to the medication and will contact this office if these side effects are intolerable.  Repetitive spaced learning was employed today to elicit superior memory formation and behavioral change.  4. Obesity, with current BMI of 52.9 [name] is currently in the action stage of change. As such, her goal is to continue with weight loss efforts. She has agreed to the [employer].   Intentional eating was discussed.   Exercise goals: No exercise has been prescribed at this time.  Behavioral modification strategies: increasing lean protein intake, decreasing simple carbohydrates, increasing vegetables, increasing water intake, decreasing eating out, no skipping meals, meal planning and cooking strategies, keeping healthy foods in the home, and planning for success.  [name] has agreed to follow-up with our clinic in 2 weeks with Dr. Lawson Radar or William Hamburger, NP. She was informed of the importance of frequent follow-up visits to maximize her success with intensive lifestyle modifications for her multiple health conditions.   Objective:   Blood pressure 122/84, pulse 72, temperature 98.5 F (36.9 C), height 5\' 5"  (1.651 m), weight (!) 317 lb (143.8 kg), last menstrual period 10/19/2010, SpO2 96 %. Body mass index is 52.75 kg/m.  General: Cooperative, alert, well developed, in no acute distress. HEENT: Conjunctivae and lids unremarkable. Cardiovascular: Regular rhythm.  Lungs: Normal work of breathing. Neurologic: No focal deficits.   Lab Results  Component  Value Date   CREATININE 0.74 04/13/2020   BUN 14 04/13/2020   NA 138 04/13/2020   K 4.5 04/13/2020   CL 98 04/13/2020   CO2 23 04/13/2020   Lab Results  Component Value Date   ALT 21 04/13/2020   AST 20 04/13/2020    ALKPHOS 59 04/13/2020   BILITOT 0.4 04/13/2020   Lab Results  Component Value Date   HGBA1C 7.7 (H) 04/13/2020   HGBA1C 6.4 01/29/2018   Lab Results  Component Value Date   INSULIN 24.1 04/13/2020   Lab Results  Component Value Date   TSH 1.490 04/13/2020   Lab Results  Component Value Date   CHOL 180 04/13/2020   HDL 52 04/13/2020   LDLCALC 101 (H) 04/13/2020   TRIG 152 (H) 04/13/2020   CHOLHDL 3 01/29/2018   Lab Results  Component Value Date   VD25OH 25.7 (L) 04/13/2020   Lab Results  Component Value Date   WBC 6.2 04/13/2020   HGB 12.5 04/13/2020   HCT 41.3 04/13/2020   MCV 76 (L) 04/13/2020   PLT 240 04/13/2020   Lab Results  Component Value Date   IRON 77 04/13/2020   TIBC 334 04/13/2020   FERRITIN 88 04/13/2020   Attestation Statements:   Reviewed by clinician on day of visit: allergies, medications, problem list, medical history, surgical history, family history, social history, and previous encounter notes.   Trude Mcburney, am acting as Energy manager for [employer], DO.  I have reviewed the above documentation for accuracy and completeness, and I agree with the above. -

## 2020-08-22 ENCOUNTER — Other Ambulatory Visit (INDEPENDENT_AMBULATORY_CARE_PROVIDER_SITE_OTHER): Payer: Self-pay | Admitting: Family Medicine

## 2020-08-22 DIAGNOSIS — E559 Vitamin D deficiency, unspecified: Secondary | ICD-10-CM

## 2020-08-25 ENCOUNTER — Telehealth: Payer: Self-pay | Admitting: Internal Medicine

## 2020-08-25 NOTE — Telephone Encounter (Signed)
Lft pt vm to call ofc to fu if pt had been sch for mammo at [facility]. thanks

## 2020-08-29 ENCOUNTER — Other Ambulatory Visit (INDEPENDENT_AMBULATORY_CARE_PROVIDER_SITE_OTHER): Payer: Self-pay | Admitting: Family Medicine

## 2020-08-29 DIAGNOSIS — E559 Vitamin D deficiency, unspecified: Secondary | ICD-10-CM

## 2020-08-30 ENCOUNTER — Ambulatory Visit (INDEPENDENT_AMBULATORY_CARE_PROVIDER_SITE_OTHER): Payer: 59 | Admitting: Family Medicine

## 2020-08-30 ENCOUNTER — Encounter (INDEPENDENT_AMBULATORY_CARE_PROVIDER_SITE_OTHER): Payer: Self-pay | Admitting: Family Medicine

## 2020-08-30 ENCOUNTER — Other Ambulatory Visit: Payer: Self-pay

## 2020-08-30 VITALS — BP 119/78 | HR 79 | Temp 98.2°F | Ht 65.0 in | Wt 314.0 lb

## 2020-08-30 DIAGNOSIS — E1165 Type 2 diabetes mellitus with hyperglycemia: Secondary | ICD-10-CM

## 2020-08-30 DIAGNOSIS — Z9189 Other specified personal risk factors, not elsewhere classified: Secondary | ICD-10-CM

## 2020-08-30 DIAGNOSIS — E559 Vitamin D deficiency, unspecified: Secondary | ICD-10-CM | POA: Diagnosis not present

## 2020-08-30 DIAGNOSIS — Z6841 Body Mass Index (BMI) 40.0 and over, adult: Secondary | ICD-10-CM | POA: Diagnosis not present

## 2020-08-30 NOTE — Progress Notes (Signed)
Chief Complaint:   OBESITY Maria Quinn is here to discuss her progress with her obesity treatment plan along with follow-up of her obesity related diagnoses. Maria Quinn is on the [employer] and states she is following her eating plan approximately 70% of the time. Maria Quinn states she is not currently exercising.  Today's visit was #: 6 Starting weight: 328 lbs Starting date: 04/13/2020 Today's weight: 314 lbs Today's date: 08/30/2020 Total lbs lost to date: 14 Total lbs lost since last in-office visit: 3  Interim History: Maria Quinn had her first session of PT for knees over the last few weeks. She is on Ozempic 0.5 mg and noticing an increase in nausea if waiting too long to eat. She has incorporated some [place] into her meal plan. Pt has had significant decrease in alcohol intake.  Subjective:   1. Type 2 diabetes mellitus with hyperglycemia, without long-term current use of insulin (HCC) Maria Quinn is on Metformin and Ozempic. She reports some GI side effects of Ozempic.  2. Vitamin D deficiency Maria Quinn is on prescription Vit D. Her Vit D level is 25.7.   3. At risk for nausea Maria Quinn is at risk for nausea due to Ozempic.  Assessment/Plan:   1. Type 2 diabetes mellitus with hyperglycemia, without long-term current use of insulin (HCC) Good blood sugar control is important to decrease the likelihood of diabetic complications such as nephropathy, neuropathy, limb loss, blindness, coronary artery disease, and death. Intensive lifestyle modification including diet, exercise and weight loss are the first line of treatment for diabetes.   Refill Metformin 500 mg PO daily, #30, 0 RF.  2. Vitamin D deficiency Low Vitamin D level contributes to fatigue and are associated with obesity, breast, and colon cancer. She agrees to continue to take prescription Vitamin D 50,000 IU every week and will follow-up for routine testing of Vitamin D, at least 2-3 times per year to avoid over-replacement.  Refill Vit D  50K IU/wk, #4, 0 RF  3. At risk for nausea Maria Quinn was given approximately 15 minutes of nausea prevention counseling today. Yanci is at risk for nausea due to her new or current medication. She was encouraged to titrate her medication slowly, make sure to stay hydrated, eat smaller portions throughout the day, and avoid high fat meals.    4. Obesity with current BMI of 52.3  Maria Quinn is currently in the action stage of change. As such, her goal is to continue with weight loss efforts. She has agreed to the [employer].   Exercise goals: All adults should avoid inactivity. Some physical activity is better than none, and adults who participate in any amount of physical activity gain some health benefits.  Behavioral modification strategies: increasing lean protein intake, meal planning and cooking strategies, keeping healthy foods in the home, and planning for success.  Maria Quinn has agreed to follow-up with our clinic in 2-3 weeks. She was informed of the importance of frequent follow-up visits to maximize her success with intensive lifestyle modifications for her multiple health conditions.   Objective:   Blood pressure 119/78, pulse 79, temperature 98.2 F (36.8 C), height 5\' 5"  (1.651 m), weight (!) 314 lb (142.4 kg), last menstrual period 10/19/2010, SpO2 96 %. Body mass index is 52.25 kg/m.  General: Cooperative, alert, well developed, in no acute distress. HEENT: Conjunctivae and lids unremarkable. Cardiovascular: Regular rhythm.  Lungs: Normal work of breathing. Neurologic: No focal deficits.   Lab Results  Component Value Date   CREATININE 0.74 04/13/2020  BUN 14 04/13/2020   NA 138 04/13/2020   K 4.5 04/13/2020   CL 98 04/13/2020   CO2 23 04/13/2020   Lab Results  Component Value Date   ALT 21 04/13/2020   AST 20 04/13/2020   ALKPHOS 59 04/13/2020   BILITOT 0.4 04/13/2020   Lab Results  Component Value Date   HGBA1C 7.7 (H) 04/13/2020   HGBA1C 6.4  01/29/2018   Lab Results  Component Value Date   INSULIN 24.1 04/13/2020   Lab Results  Component Value Date   TSH 1.490 04/13/2020   Lab Results  Component Value Date   CHOL 180 04/13/2020   HDL 52 04/13/2020   LDLCALC 101 (H) 04/13/2020   TRIG 152 (H) 04/13/2020   CHOLHDL 3 01/29/2018   Lab Results  Component Value Date   VD25OH 25.7 (L) 04/13/2020   Lab Results  Component Value Date   WBC 6.2 04/13/2020   HGB 12.5 04/13/2020   HCT 41.3 04/13/2020   MCV 76 (L) 04/13/2020   PLT 240 04/13/2020   Lab Results  Component Value Date   IRON 77 04/13/2020   TIBC 334 04/13/2020   FERRITIN 88 04/13/2020   Attestation Statements:   Reviewed by clinician on day of visit: allergies, medications, problem list, medical history, surgical history, family history, social history, and previous encounter notes.  Edmund Hilda, CMA, am acting as transcriptionist for Reuben Likes, MD.   I have reviewed the above documentation for accuracy and completeness, and I agree with the above. - Reuben Likes, MD

## 2020-08-30 NOTE — Telephone Encounter (Signed)
Pt last seen by Dr. Brown.  

## 2020-08-30 NOTE — Telephone Encounter (Signed)
Dr.Brown 

## 2020-08-31 ENCOUNTER — Other Ambulatory Visit (INDEPENDENT_AMBULATORY_CARE_PROVIDER_SITE_OTHER): Payer: Self-pay | Admitting: Family Medicine

## 2020-08-31 ENCOUNTER — Other Ambulatory Visit: Payer: Self-pay | Admitting: Internal Medicine

## 2020-08-31 DIAGNOSIS — E559 Vitamin D deficiency, unspecified: Secondary | ICD-10-CM

## 2020-08-31 DIAGNOSIS — E1165 Type 2 diabetes mellitus with hyperglycemia: Secondary | ICD-10-CM

## 2020-08-31 DIAGNOSIS — I10 Essential (primary) hypertension: Secondary | ICD-10-CM

## 2020-09-08 ENCOUNTER — Other Ambulatory Visit (INDEPENDENT_AMBULATORY_CARE_PROVIDER_SITE_OTHER): Payer: Self-pay | Admitting: Family Medicine

## 2020-09-08 DIAGNOSIS — E559 Vitamin D deficiency, unspecified: Secondary | ICD-10-CM

## 2020-09-08 MED ORDER — VITAMIN D (ERGOCALCIFEROL) 1.25 MG (50000 UNIT) PO CAPS: 50000.0000 [IU] | ORAL_CAPSULE | ORAL | 0 refills | Status: DC

## 2020-09-08 MED ORDER — METFORMIN HCL 500 MG PO TABS: 500.0000 mg | ORAL_TABLET | Freq: Every day | ORAL | 0 refills | Status: DC

## 2020-09-14 ENCOUNTER — Ambulatory Visit (INDEPENDENT_AMBULATORY_CARE_PROVIDER_SITE_OTHER): Payer: 59 | Admitting: Physician Assistant

## 2020-09-14 ENCOUNTER — Encounter (INDEPENDENT_AMBULATORY_CARE_PROVIDER_SITE_OTHER): Payer: Self-pay | Admitting: Physician Assistant

## 2020-09-14 ENCOUNTER — Other Ambulatory Visit: Payer: Self-pay

## 2020-09-14 VITALS — BP 114/79 | HR 71 | Temp 98.0°F | Ht 65.0 in | Wt 312.0 lb

## 2020-09-14 DIAGNOSIS — E559 Vitamin D deficiency, unspecified: Secondary | ICD-10-CM

## 2020-09-14 DIAGNOSIS — Z9189 Other specified personal risk factors, not elsewhere classified: Secondary | ICD-10-CM | POA: Diagnosis not present

## 2020-09-14 DIAGNOSIS — E1169 Type 2 diabetes mellitus with other specified complication: Secondary | ICD-10-CM | POA: Diagnosis not present

## 2020-09-14 DIAGNOSIS — E785 Hyperlipidemia, unspecified: Secondary | ICD-10-CM

## 2020-09-14 DIAGNOSIS — Z6841 Body Mass Index (BMI) 40.0 and over, adult: Secondary | ICD-10-CM

## 2020-09-14 DIAGNOSIS — E1165 Type 2 diabetes mellitus with hyperglycemia: Secondary | ICD-10-CM

## 2020-09-14 MED ORDER — OZEMPIC (0.25 OR 0.5 MG/DOSE) 2 MG/1.5ML ~~LOC~~ SOPN: 0.5000 mg | PEN_INJECTOR | SUBCUTANEOUS | 0 refills | Status: DC

## 2020-09-14 NOTE — Progress Notes (Signed)
Chief Complaint:   OBESITY Maria Quinn is here to discuss her progress with her obesity treatment plan along with follow-up of her obesity related diagnoses. Alpa is on the [employer] and states she is following her eating plan approximately 60% of the time. Makaela states she is doing 0 minutes 0 times per week.  Today's visit was #: 7 Starting weight: 328 lbs Starting date: 04/13/2020 Today's weight: 312 lbs Today's date: 09/14/2020 Total lbs lost to date: 16 Total lbs lost since last in-office visit: 2  Interim History: Maria Quinn did well with weight loss. She notes pain in her knees and feet, and she is seeing a physical therapist. She doesn't think that she is eating enough throughout the day, as her appetite is decreased with Ozempic. She is in [employer] and she travels a lot for work.  Subjective:   1. Type 2 diabetes mellitus with hyperglycemia, without long-term current use of insulin (HCC) Maria Quinn is no longer having nausea. She is on 0.5 mg of Ozempic.   2. Hyperlipidemia associated with type 2 diabetes mellitus (HCC) Maria Quinn is not on medications, and she denies chest pain. She is not exercising due to pain.  3. Vitamin D deficiency Maria Quinn is on Vit D, and she denies nausea, vomiting, or muscle weakness.  4. At risk for deficient intake of food Maria Quinn is at a higher than average risk of deficient intake of food due to not eating enough throughout the day.  Assessment/Plan:   1. Type 2 diabetes mellitus with hyperglycemia, without long-term current use of insulin (HCC) We will check labs today. Aashi will continue Ozempic and we will refill for 1 month. Good blood sugar control is important to decrease the likelihood of diabetic complications such as nephropathy, neuropathy, limb loss, blindness, coronary artery disease, and death. Intensive lifestyle modification including diet, exercise and weight loss are the first line of treatment for diabetes.   -  Semaglutide,0.25 or 0.5MG /DOS, (OZEMPIC, 0.25 OR 0.5 MG/DOSE,) 2 MG/1.5ML SOPN; Inject 0.5 mg into the skin once a week.  Dispense: 1.5 mL; Refill: 0 - Comprehensive metabolic panel - Hemoglobin A1c - Insulin, random  2. Hyperlipidemia associated with type 2 diabetes mellitus (HCC) Cardiovascular risk and specific lipid/LDL goals reviewed. We discussed several lifestyle modifications today. We will check labs today. [name] will continue to work on diet, exercise and weight loss efforts. Orders and follow up as documented in patient record.   Counseling Intensive lifestyle modifications are the first line treatment for this issue. Dietary changes: Increase soluble fiber. Decrease simple carbohydrates. Exercise changes: Moderate to vigorous-intensity aerobic activity 150 minutes per week if tolerated. Lipid-lowering medications: see documented in medical record.  - Lipid panel  3. Vitamin D deficiency Low Vitamin D level contributes to fatigue and are associated with obesity, breast, and colon cancer. We will check labs today. [name] will continue prescription Vitamin D 50,000 IU every week and will follow-up for routine testing of Vitamin D, at least 2-3 times per year to avoid over-replacement.  - VITAMIN D 25 Hydroxy (Vit-D Deficiency, Fractures)  4. At risk for deficient intake of food Maria Quinn was given approximately 15 minutes of deficit intake of food prevention counseling today. Maria Quinn is at risk for eating too few calories based on current food recall. She was encouraged to focus on meeting caloric and protein goals according to her recommended meal plan.   5. Obesity with current BMI of 51.92 Maria Quinn is currently in the action stage of change.  As such, her goal is to continue with weight loss efforts. She has agreed to the [employer].   Exercise goals: No exercise has been prescribed at this time.  Behavioral modification strategies: increasing lean protein intake, no skipping  meals, and meal planning and cooking strategies.  Laela has agreed to follow-up with our clinic in 2 weeks. She was informed of the importance of frequent follow-up visits to maximize her success with intensive lifestyle modifications for her multiple health conditions.   Maria Quinn was informed we would discuss her lab results at her next visit unless there is a critical issue that needs to be addressed sooner. Moxie agreed to keep her next visit at the agreed upon time to discuss these results.  Objective:   Blood pressure 114/79, pulse 71, temperature 98 F (36.7 C), height 5\' 5"  (1.651 m), weight (!) 312 lb (141.5 kg), last menstrual period 10/19/2010, SpO2 97 %. Body mass index is 51.92 kg/m.  General: Cooperative, alert, well developed, in no acute distress. HEENT: Conjunctivae and lids unremarkable. Cardiovascular: Regular rhythm.  Lungs: Normal work of breathing. Neurologic: No focal deficits.   Lab Results  Component Value Date   CREATININE 0.74 04/13/2020   BUN 14 04/13/2020   NA 138 04/13/2020   K 4.5 04/13/2020   CL 98 04/13/2020   CO2 23 04/13/2020   Lab Results  Component Value Date   ALT 21 04/13/2020   AST 20 04/13/2020   ALKPHOS 59 04/13/2020   BILITOT 0.4 04/13/2020   Lab Results  Component Value Date   HGBA1C 7.7 (H) 04/13/2020   HGBA1C 6.4 01/29/2018   Lab Results  Component Value Date   INSULIN 24.1 04/13/2020   Lab Results  Component Value Date   TSH 1.490 04/13/2020   Lab Results  Component Value Date   CHOL 180 04/13/2020   HDL 52 04/13/2020   LDLCALC 101 (H) 04/13/2020   TRIG 152 (H) 04/13/2020   CHOLHDL 3 01/29/2018   Lab Results  Component Value Date   VD25OH 25.7 (L) 04/13/2020   Lab Results  Component Value Date   WBC 6.2 04/13/2020   HGB 12.5 04/13/2020   HCT 41.3 04/13/2020   MCV 76 (L) 04/13/2020   PLT 240 04/13/2020   Lab Results  Component Value Date   IRON 77 04/13/2020   TIBC 334 04/13/2020   FERRITIN 88 04/13/2020    Attestation Statements:   Reviewed by clinician on day of visit: allergies, medications, problem list, medical history, surgical history, family history, social history, and previous encounter notes.    06/13/2020, am acting as transcriptionist for Trude Mcburney, PA-C.  I have reviewed the above documentation for accuracy and completeness, and I agree with the above. [employer], PA-C

## 2020-09-15 LAB — COMPREHENSIVE METABOLIC PANEL
ALT: 14 IU/L (ref 0–32)
AST: 13 IU/L (ref 0–40)
Albumin/Globulin Ratio: 1.5 (ref 1.2–2.2)
Albumin: 4.4 g/dL (ref 3.8–4.8)
Alkaline Phosphatase: 51 IU/L (ref 44–121)
BUN/Creatinine Ratio: 16 (ref 9–23)
BUN: 14 mg/dL (ref 6–24)
Bilirubin Total: 0.4 mg/dL (ref 0.0–1.2)
CO2: 24 mmol/L (ref 20–29)
Calcium: 9.1 mg/dL (ref 8.7–10.2)
Chloride: 102 mmol/L (ref 96–106)
Creatinine, Ser: 0.89 mg/dL (ref 0.57–1.00)
Globulin, Total: 2.9 g/dL (ref 1.5–4.5)
Glucose: 106 mg/dL — ABNORMAL HIGH (ref 65–99)
Potassium: 3.9 mmol/L (ref 3.5–5.2)
Sodium: 142 mmol/L (ref 134–144)
Total Protein: 7.3 g/dL (ref 6.0–8.5)
eGFR: 83 mL/min/{1.73_m2} (ref >59)

## 2020-09-15 LAB — VITAMIN D 25 HYDROXY (VIT D DEFICIENCY, FRACTURES): Vit D, 25-Hydroxy: 41.9 ng/mL (ref 30.0–100.0)

## 2020-09-15 LAB — LIPID PANEL
Chol/HDL Ratio: 3.5 ratio (ref 0.0–4.4)
Cholesterol, Total: 179 mg/dL (ref 100–199)
HDL: 51 mg/dL (ref >39)
LDL Chol Calc (NIH): 106 mg/dL — ABNORMAL HIGH (ref 0–99)
Triglycerides: 122 mg/dL (ref 0–149)
VLDL Cholesterol Cal: 22 mg/dL (ref 5–40)

## 2020-09-15 LAB — HEMOGLOBIN A1C
Est. average glucose Bld gHb Est-mCnc: 146 mg/dL
Hgb A1c MFr Bld: 6.7 % — ABNORMAL HIGH (ref 4.8–5.6)

## 2020-09-15 LAB — INSULIN, RANDOM: INSULIN: 17.8 u[IU]/mL (ref 2.6–24.9)

## 2020-09-20 ENCOUNTER — Encounter: Payer: Self-pay | Admitting: Internal Medicine

## 2020-09-20 NOTE — Telephone Encounter (Signed)
Called to speak with [name]. Maria Quinn c/o hives along neck, lower jaw, and forehead. Pt states that they are small spots that itch and she rates them a 4-6 on pain/irritation. Pt states that she has not used any OTC medication to treat the itching. She is not having any difficulty breathing or swallowing. Pt states that she has been outside on 09/18/20 and that she she was around a lot of bugs. She is not sure if she has any bug bites and has not noticed any rash along her limbs, only on her face and neck. Pt states that she is not in any distress at this time and states that she will go to the urgent care to be evaluated if anything gets worse or if she feels any discomfort. Please advise.

## 2020-09-22 NOTE — Telephone Encounter (Signed)
Left a message to call back.

## 2020-09-23 NOTE — Telephone Encounter (Signed)
Called to speak with Maria Quinn. Maria Quinn states that she is still experiencing hives along her face, neck, shoulders, and nose. She states that they are along her eyes and that the hives are beginning to dry out and scab over. They are still itching at this time. Pt states that her friend died on 09/23/2020 and that she has been crying and rubbing at her eyes a lot. Pt states that the hives along her eyelids are dry and itching as well. Pt declined going to the urgent care as she feels that the hives are healing. Pt received margaret's instructions and verbalized understanding. She had no further questions.

## 2020-10-01 ENCOUNTER — Ambulatory Visit (INDEPENDENT_AMBULATORY_CARE_PROVIDER_SITE_OTHER): Payer: 59 | Admitting: Physician Assistant

## 2020-10-01 ENCOUNTER — Other Ambulatory Visit: Payer: Self-pay

## 2020-10-01 ENCOUNTER — Encounter (INDEPENDENT_AMBULATORY_CARE_PROVIDER_SITE_OTHER): Payer: Self-pay | Admitting: Physician Assistant

## 2020-10-01 VITALS — BP 125/91 | HR 71 | Temp 97.7°F | Ht 65.0 in | Wt 320.0 lb

## 2020-10-01 DIAGNOSIS — Z9189 Other specified personal risk factors, not elsewhere classified: Secondary | ICD-10-CM

## 2020-10-01 DIAGNOSIS — Z6841 Body Mass Index (BMI) 40.0 and over, adult: Secondary | ICD-10-CM

## 2020-10-01 DIAGNOSIS — E559 Vitamin D deficiency, unspecified: Secondary | ICD-10-CM

## 2020-10-01 MED ORDER — VITAMIN D (ERGOCALCIFEROL) 1.25 MG (50000 UNIT) PO CAPS: 50000.0000 [IU] | ORAL_CAPSULE | ORAL | 0 refills | Status: DC

## 2020-10-04 NOTE — Progress Notes (Signed)
Chief Complaint:   OBESITY Maria Quinn is here to discuss her progress with her obesity treatment plan along with follow-up of her obesity related diagnoses. Maria Quinn is on the [employer] and states she is following her eating plan approximately 50% of the time. Maria Quinn states she is doing 0 minutes 0 times per week.  Today's visit was #: 8 Starting weight: 328 lbs Starting date: 04/13/2020 Today's weight: 320 lbs Today's date: 10/01/2020 Total lbs lost to date: 8 lbs Total lbs lost since last in-office visit: 0  Interim History: Maria Quinn has had multiple deaths in her family and friends groups recently. She was not eating much but when she ate, she ate food brought to her. She is attending a funeral today and tomorrow and needs to go grocery shopping. She will eat eggs, veggies, sausage, 45 calorie bread, Keto wraps and protein pasta and some meats. No dairy.   Subjective:   1. Vitamin D deficiency She is currently taking prescription vitamin D 50,000 IU each week. She denies nausea, vomiting or muscle weakness.  2. At risk for osteoporosis Maria Quinn is at risk for osteoporosis due to Vitamin D.  Assessment/Plan:   1. Vitamin D deficiency Low Vitamin D level contributes to fatigue and are associated with obesity, breast, and colon cancer. We will refill prescription Vitamin D 50,000 IU every week for 1 month with no refills and Maria Quinn will follow-up for routine testing of Vitamin D, at least 2-3 times per year to avoid over-replacement.  - Vitamin D, Ergocalciferol, (DRISDOL) 1.25 MG (50000 UNIT) CAPS capsule; Take 1 capsule (50,000 Units total) by mouth every 7 (seven) days.  Dispense: 4 capsule; Refill: 0  2. At risk for osteoporosis Maria Quinn was given approximately 15 minutes of osteoporosis prevention counseling today. Maria Quinn is at risk for osteopenia and osteoporosis due to her Vitamin D deficiency. She was encouraged to take her Vitamin D and follow her higher calcium diet and increase  strengthening exercise to help strengthen her bones and decrease her risk of osteopenia and osteoporosis.  Repetitive spaced learning was employed today to elicit superior memory formation and behavioral change.   3. Obesity with current BMI of 53.25 Maria Quinn is currently in the action stage of change. As such, her goal is to continue with weight loss efforts. She has agreed to the [employer].   Exercise goals: No exercise has been prescribed at this time.  Behavioral modification strategies: increasing lean protein intake and no skipping meals.  Maria Quinn has agreed to follow-up with our clinic in 2 weeks. She was informed of the importance of frequent follow-up visits to maximize her success with intensive lifestyle modifications for her multiple health conditions.   Objective:   Blood pressure (!) 125/91, pulse 71, temperature 97.7 F (36.5 C), height 5\' 5"  (1.651 m), weight (!) 320 lb (145.2 kg), last menstrual period 10/19/2010, SpO2 97 %. Body mass index is 53.25 kg/m.  General: Cooperative, alert, well developed, in no acute distress. HEENT: Conjunctivae and lids unremarkable. Cardiovascular: Regular rhythm.  Lungs: Normal work of breathing. Neurologic: No focal deficits.   Lab Results  Component Value Date   CREATININE 0.89 09/14/2020   BUN 14 09/14/2020   NA 142 09/14/2020   K 3.9 09/14/2020   CL 102 09/14/2020   CO2 24 09/14/2020   Lab Results  Component Value Date   ALT 14 09/14/2020   AST 13 09/14/2020   ALKPHOS 51 09/14/2020   BILITOT 0.4 09/14/2020   Lab Results  Component Value Date   HGBA1C 6.7 (H) 09/14/2020   HGBA1C 7.7 (H) 04/13/2020   HGBA1C 6.4 01/29/2018   Lab Results  Component Value Date   INSULIN 17.8 09/14/2020   INSULIN 24.1 04/13/2020   Lab Results  Component Value Date   TSH 1.490 04/13/2020   Lab Results  Component Value Date   CHOL 179 09/14/2020   HDL 51 09/14/2020   LDLCALC 106 (H) 09/14/2020   TRIG 122 09/14/2020    CHOLHDL 3.5 09/14/2020   Lab Results  Component Value Date   VD25OH 41.9 09/14/2020   VD25OH 25.7 (L) 04/13/2020   Lab Results  Component Value Date   WBC 6.2 04/13/2020   HGB 12.5 04/13/2020   HCT 41.3 04/13/2020   MCV 76 (L) 04/13/2020   PLT 240 04/13/2020   Lab Results  Component Value Date   IRON 77 04/13/2020   TIBC 334 04/13/2020   FERRITIN 88 04/13/2020    Attestation Statements:   Reviewed by clinician on day of visit: allergies, medications, problem list, medical history, surgical history, family history, social history, and previous encounter notes.  I, Sindy Messing, am acting as Energy manager for [employer], PA-C.   I have reviewed the above documentation for accuracy and completeness, and I agree with the above. Alois Cliche, PA-C

## 2020-10-19 ENCOUNTER — Encounter (INDEPENDENT_AMBULATORY_CARE_PROVIDER_SITE_OTHER): Payer: Self-pay | Admitting: Physician Assistant

## 2020-10-19 ENCOUNTER — Ambulatory Visit (INDEPENDENT_AMBULATORY_CARE_PROVIDER_SITE_OTHER): Payer: 59 | Admitting: Physician Assistant

## 2020-10-19 ENCOUNTER — Other Ambulatory Visit: Payer: Self-pay

## 2020-10-19 VITALS — BP 148/88 | HR 66 | Temp 98.1°F | Ht 65.0 in | Wt 321.0 lb

## 2020-10-19 DIAGNOSIS — Z9189 Other specified personal risk factors, not elsewhere classified: Secondary | ICD-10-CM | POA: Diagnosis not present

## 2020-10-19 DIAGNOSIS — E1165 Type 2 diabetes mellitus with hyperglycemia: Secondary | ICD-10-CM

## 2020-10-19 DIAGNOSIS — Z6841 Body Mass Index (BMI) 40.0 and over, adult: Secondary | ICD-10-CM

## 2020-10-19 DIAGNOSIS — I152 Hypertension secondary to endocrine disorders: Secondary | ICD-10-CM

## 2020-10-19 DIAGNOSIS — E1159 Type 2 diabetes mellitus with other circulatory complications: Secondary | ICD-10-CM | POA: Diagnosis not present

## 2020-10-19 MED ORDER — OZEMPIC (0.25 OR 0.5 MG/DOSE) 2 MG/1.5ML ~~LOC~~ SOPN: 0.5000 mg | PEN_INJECTOR | SUBCUTANEOUS | 0 refills | Status: DC

## 2020-10-19 NOTE — Progress Notes (Signed)
Chief Complaint:   OBESITY Maria Quinn is here to discuss her progress with her obesity treatment plan along with follow-up of her obesity related diagnoses. Maria Quinn is on the [employer] and states she is following her eating plan approximately 10% of the time. Maria Quinn states she is not currently exercising.  Today's visit was #: 9 Starting weight: 328 lbs Starting date: 04/13/2020 Today's weight: 321 lbs Today's date: 10/19/2020 Total lbs lost to date: 7 Total lbs lost since last in-office visit: 0  Interim History: Maria Quinn reports that she gave up on the plan over the last few weeks due to multiple deaths with family recently. She cooked her first meal in a month last night.  Subjective:   1. Type 2 diabetes mellitus with hyperglycemia, without long-term current use of insulin (HCC) Maria Quinn missed 1 dose of Ozempic recently. She is also on Metformin. Her last A1c was 6.7.  2. Hypertension associated with diabetes (HCC) Maria Quinn did not take her meds today. She denies headache or chest pain.  3. At risk for deficient intake of food The patient is at a higher than average risk of deficient intake of food due to inadequate intake.  Assessment/Plan:   1. Type 2 diabetes mellitus with hyperglycemia, without long-term current use of insulin (HCC) Good blood sugar control is important to decrease the likelihood of diabetic complications such as nephropathy, neuropathy, limb loss, blindness, coronary artery disease, and death. Intensive lifestyle modification including diet, exercise and weight loss are the first line of treatment for diabetes. Continue medications and plan.  Refill- Semaglutide,0.25 or 0.5MG /DOS, (OZEMPIC, 0.25 OR 0.5 MG/DOSE,) 2 MG/1.5ML SOPN; Inject 0.5 mg into the skin once a week.  Dispense: 1.5 mL; Refill: 0  2. Hypertension associated with diabetes (HCC) Maria Quinn is working on healthy weight loss and exercise to improve blood pressure control. We will watch for signs of  hypotension as she continues her lifestyle modifications. Continue with meds and weight loss.  3. At risk for deficient intake of food Maria Quinn was given approximately 15 minutes of deficit intake of food prevention counseling today. Maria Quinn is at risk for eating too few calories based on current food recall. She was encouraged to focus on meeting caloric and protein goals according to her recommended meal plan.    4. Obesity with current BMI of 53.42  Maria Quinn is currently in the action stage of change. As such, her goal is to continue with weight loss efforts. She has agreed to the [employer].   Exercise goals:  As is  Behavioral modification strategies: increasing lean protein intake, no skipping meals, and meal planning and cooking strategies.  Maria Quinn has agreed to follow-up with our clinic in 2 weeks. She was informed of the importance of frequent follow-up visits to maximize her success with intensive lifestyle modifications for her multiple health conditions.   Objective:   Blood pressure (!) 148/88, pulse 66, temperature 98.1 F (36.7 C), height 5\' 5"  (1.651 m), weight (!) 321 lb (145.6 kg), last menstrual period 10/19/2010, SpO2 98 %. Body mass index is 53.42 kg/m.  General: Cooperative, alert, well developed, in no acute distress. HEENT: Conjunctivae and lids unremarkable. Cardiovascular: Regular rhythm.  Lungs: Normal work of breathing. Neurologic: No focal deficits.   Lab Results  Component Value Date   CREATININE 0.89 09/14/2020   BUN 14 09/14/2020   NA 142 09/14/2020   K 3.9 09/14/2020   CL 102 09/14/2020   CO2 24 09/14/2020   Lab Results  Component  Value Date   ALT 14 09/14/2020   AST 13 09/14/2020   ALKPHOS 51 09/14/2020   BILITOT 0.4 09/14/2020   Lab Results  Component Value Date   HGBA1C 6.7 (H) 09/14/2020   HGBA1C 7.7 (H) 04/13/2020   HGBA1C 6.4 01/29/2018   Lab Results  Component Value Date   INSULIN 17.8 09/14/2020   INSULIN 24.1 04/13/2020    Lab Results  Component Value Date   TSH 1.490 04/13/2020   Lab Results  Component Value Date   CHOL 179 09/14/2020   HDL 51 09/14/2020   LDLCALC 106 (H) 09/14/2020   TRIG 122 09/14/2020   CHOLHDL 3.5 09/14/2020   Lab Results  Component Value Date   VD25OH 41.9 09/14/2020   VD25OH 25.7 (L) 04/13/2020   Lab Results  Component Value Date   WBC 6.2 04/13/2020   HGB 12.5 04/13/2020   HCT 41.3 04/13/2020   MCV 76 (L) 04/13/2020   PLT 240 04/13/2020   Lab Results  Component Value Date   IRON 77 04/13/2020   TIBC 334 04/13/2020   FERRITIN 88 04/13/2020    Attestation Statements:   Reviewed by clinician on day of visit: allergies, medications, problem list, medical history, surgical history, family history, social history, and previous encounter notes.  Edmund Hilda, CMA, am acting as transcriptionist for [employer], PA-C.  I have reviewed the above documentation for accuracy and completeness, and I agree with the above. Alois Cliche, PA-C

## 2020-11-03 ENCOUNTER — Ambulatory Visit (INDEPENDENT_AMBULATORY_CARE_PROVIDER_SITE_OTHER): Payer: 59 | Admitting: Physician Assistant

## 2020-11-03 ENCOUNTER — Encounter (INDEPENDENT_AMBULATORY_CARE_PROVIDER_SITE_OTHER): Payer: Self-pay

## 2020-11-05 ENCOUNTER — Ambulatory Visit: Payer: 59 | Admitting: Internal Medicine

## 2020-11-05 ENCOUNTER — Telehealth: Payer: Self-pay | Admitting: Internal Medicine

## 2020-11-05 NOTE — Telephone Encounter (Signed)
Patient no-showed today's appointment; appointment was for 11/05/20 at 7:30 am, provider notified for review of record. Letter sent for patient to call in and re-schedule.

## 2020-11-11 ENCOUNTER — Ambulatory Visit (INDEPENDENT_AMBULATORY_CARE_PROVIDER_SITE_OTHER): Payer: 59 | Admitting: Internal Medicine

## 2020-11-11 ENCOUNTER — Other Ambulatory Visit: Payer: Self-pay

## 2020-11-11 ENCOUNTER — Encounter (INDEPENDENT_AMBULATORY_CARE_PROVIDER_SITE_OTHER): Payer: Self-pay

## 2020-11-11 ENCOUNTER — Ambulatory Visit (INDEPENDENT_AMBULATORY_CARE_PROVIDER_SITE_OTHER): Payer: 59 | Admitting: Physician Assistant

## 2020-11-11 ENCOUNTER — Encounter: Payer: Self-pay | Admitting: Internal Medicine

## 2020-11-11 VITALS — BP 140/96 | Temp 97.7°F | Ht 65.0 in | Wt 320.1 lb

## 2020-11-11 DIAGNOSIS — Z0001 Encounter for general adult medical examination with abnormal findings: Secondary | ICD-10-CM

## 2020-11-11 DIAGNOSIS — H409 Unspecified glaucoma: Secondary | ICD-10-CM

## 2020-11-11 DIAGNOSIS — Z1231 Encounter for screening mammogram for malignant neoplasm of breast: Secondary | ICD-10-CM | POA: Diagnosis not present

## 2020-11-11 DIAGNOSIS — M79671 Pain in right foot: Secondary | ICD-10-CM | POA: Diagnosis not present

## 2020-11-11 DIAGNOSIS — M79672 Pain in left foot: Secondary | ICD-10-CM

## 2020-11-11 DIAGNOSIS — I10 Essential (primary) hypertension: Secondary | ICD-10-CM

## 2020-11-11 DIAGNOSIS — E1165 Type 2 diabetes mellitus with hyperglycemia: Secondary | ICD-10-CM | POA: Diagnosis not present

## 2020-11-11 MED ORDER — AMLODIPINE BESYLATE 10 MG PO TABS: 10.0000 mg | ORAL_TABLET | Freq: Every day | ORAL | 3 refills | Status: DC

## 2020-11-11 MED ORDER — SPIRONOLACTONE 50 MG PO TABS: 25.0000 mg | ORAL_TABLET | Freq: Every day | ORAL | 3 refills | Status: DC

## 2020-11-11 MED ORDER — LOSARTAN POTASSIUM 50 MG PO TABS: 50.0000 mg | ORAL_TABLET | Freq: Every day | ORAL | 3 refills | Status: DC

## 2020-11-11 MED ORDER — CHLORTHALIDONE 25 MG PO TABS: 25.0000 mg | ORAL_TABLET | Freq: Every day | ORAL | 3 refills | Status: DC

## 2020-11-11 MED ORDER — METFORMIN HCL 500 MG PO TABS: 500.0000 mg | ORAL_TABLET | Freq: Every day | ORAL | 3 refills | Status: DC

## 2020-11-11 NOTE — Progress Notes (Signed)
Chief Complaint  Patient presents with   Follow-up   Annual  1. 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 She feel down the stairs in 08/2020 and since been having issues with pain 3/10 today pain score but has been worse. Pain worse left lateral foot at times limping she had tried cbd topical cream w/o significant relief  2. Htn elevated today did not have meds has norvasc 10 mg qd, chlorthalidone 25 mg qd spironolactone 50 mg qd, losartan 50 mg qd Also she was upset turned away at wt loss clinic today and front desk staff in our office was rude to her  3. Obesity s/p wt loss surgery on ozempic 0.5 weekly but having diarrhea appt had to be rescheduled today 11/11/20 as she was 5 minutes late for appt and this appt resch 11/17/20 to disc possibly increasing dose to 1 mg weekly  4. Eye doctor oscar overthrope on [address] in [place] was worried about glaucoma will refer to ophthalmology  09/14/20 a1c 6.7 5. Grief still grieving dads death and recently performer she knew in her 15s died given # [facility] for therapy   Review of Systems  Constitutional:  Negative for weight loss.  HENT:  Negative for hearing loss.   Eyes:        Reduced vision  Respiratory:  Negative for shortness of breath.   Cardiovascular:  Negative for chest pain.  Gastrointestinal:  Negative for abdominal pain and blood in stool.  Genitourinary:  Negative for dysuria.  Musculoskeletal:  Positive for joint pain. Negative for falls.  Skin:  Negative for rash.  Neurological:  Negative for headaches.  Psychiatric/Behavioral:  Positive for depression.        +grief  Past Medical History:  Diagnosis Date   Anemia    Anxiety    Arthritis    Back pain    Chicken pox    Depression    Exocrine pancreatic insufficiency    Gastric bypass for obesity complicating pregnancy, birth, puerperium    Hyperlipidemia    Hypertension    Hypertension associated  with diabetes ([place])    OSA (obstructive sleep apnea)    Other fatigue    Pre-diabetes    Rheumatoid arthritis (HCC)    Sleep apnea    SOB (shortness of breath) on exertion    Swelling of both lower extremities    Vitamin D deficiency    Past Surgical History:  Procedure Laterality Date   ABDOMINAL HYSTERECTOMY     Dr. Daiva Huge 2012    COLONOSCOPY WITH PROPOFOL N/A 01/01/2019   Procedure: COLONOSCOPY WITH PROPOFOL;  Surgeon: Virgel Manifold, MD;  Location: [facility] ENDOSCOPY;  Service: Endoscopy;  Laterality: N/A;   ESOPHAGOGASTRODUODENOSCOPY (EGD) WITH PROPOFOL N/A 01/01/2019   Procedure: ESOPHAGOGASTRODUODENOSCOPY (EGD) WITH PROPOFOL;  Surgeon: Virgel Manifold, MD;  Location: [facility] ENDOSCOPY;  Service: Endoscopy;  Laterality: N/A;   OOPHORECTOMY     40 y.o 1 ovary out per pt, later in life 2nd ovary out    ROUX-EN-Y GASTRIC BYPASS     2010 losst down to 180s gained back    TONSILLECTOMY     1987   Family History  Problem Relation Age of Onset   Depression Mother    Diabetes Mother    Hypertension Mother    Miscarriages / [place] Mother    Cancer Father        [email or web address]   Diabetes  Father    Heart disease Father    Hyperlipidemia Father    Hypertension Father    Kidney disease Father    Stroke Father    Depression Sister    Alcohol abuse Brother    Cancer Brother        lung    Drug abuse Brother    Arthritis Maternal Grandmother    Diabetes Maternal Grandmother    Hypertension Maternal Grandmother    Stroke Maternal Grandmother    Alcohol abuse Maternal Grandfather    Stroke Paternal Grandmother    Cancer Paternal Grandfather        ? type   Alcohol abuse Brother    Early death Brother    Social History   Socioeconomic History   Marital status: Single    Spouse name: Not on file   Number of children: Not on file   Years of education: Not on file   Highest education level: Not on file  Occupational History   Occupation: Scientist, product/process development   Tobacco Use   Smoking status: Never   Smokeless tobacco: Never  Vaping Use   Vaping Use: Never used  Substance and Sexual Activity   Alcohol use: Yes    Alcohol/week: 2.0 - 3.0 standard drinks    Types: 2 - 3 Glasses of wine per week   Drug use: No   Sexual activity: Yes    Comment: men  Other Topics Concern   Not on file  Social History Narrative   Some college   Entertainer    Single    No kids    Wears seat belt, safe in relationship    Will start Job 09/16/2018 [employer] in [place] 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   blood glucose meter kit and supplies Dispense based on patient and insurance preference. Use up to two times daily as directed. (FOR ICD-10 E10.9, E11.9). 1 year supplies   Cholecalciferol (VITAMIN D) 50 MCG (2000 UT) CAPS Take by mouth daily.   Multiple Vitamins-Minerals (CENTRUM WOMEN PO) Take by mouth.   Semaglutide,0.25 or 0.5MG/DOS, (OZEMPIC, 0.25 OR 0.5 MG/DOSE,) 2 MG/1.5ML SOPN Inject 0.5 mg into the skin once a week.   [DISCONTINUED] amLODipine (NORVASC) 10 MG tablet Take 1 tablet by mouth once daily   [DISCONTINUED] chlorthalidone (HYGROTON) 25 MG tablet Take 1 tablet (25 mg total) by mouth daily.   [DISCONTINUED] losartan (COZAAR) 50 MG tablet Take 1 tablet (50 mg total) by mouth daily. At night   [DISCONTINUED] metFORMIN (GLUCOPHAGE) 500 MG tablet Take 1 tablet (500 mg total) by mouth daily with breakfast.   [DISCONTINUED] spironolactone (ALDACTONE) 50 MG tablet Take 0.5 tablets (25 mg total) by mouth daily.   Allergies  Allergen Reactions   Penicillins Rash    Recent Results (from the past 2160 hour(s))  Comprehensive metabolic panel     Status: Abnormal   Collection Time: 09/14/20  9:25 AM  Result Value Ref Range   Glucose 106 (H) 65 - 99 mg/dL   BUN 14 6 - 24  mg/dL   Creatinine, Ser 0.89 0.57 - 1.00 mg/dL   eGFR 83 >59 mL/min/1.73   BUN/Creatinine Ratio 16 9 - 23   Sodium 142 134 - 144 mmol/L   Potassium 3.9 3.5 - 5.2 mmol/L   Chloride 102 96 - 106 mmol/L   CO2 24 20 - 29 mmol/L   Calcium 9.1 8.7 - 10.2 mg/dL   Total Protein 7.3 6.0 - 8.5 g/dL   Albumin 4.4 3.8 - 4.8 g/dL   Globulin, Total 2.9 1.5 - 4.5 g/dL   Albumin/Globulin Ratio 1.5 1.2 - 2.2   Bilirubin Total 0.4 0.0 - 1.2 mg/dL   Alkaline Phosphatase 51 44 - 121 IU/L   AST 13 0 - 40 IU/L   ALT 14 0 - 32 IU/L  Hemoglobin A1c     Status: Abnormal   Collection Time: 09/14/20  9:25 AM  Result Value Ref Range   Hgb A1c MFr Bld 6.7 (H) 4.8 - 5.6 %    Comment:          Prediabetes: 5.7 - 6.4          Diabetes: >6.4          Glycemic control for adults with diabetes: <7.0    Est. average glucose Bld gHb Est-mCnc 146 mg/dL  Insulin, random     Status: None   Collection Time: 09/14/20  9:25 AM  Result Value Ref Range   INSULIN 17.8 2.6 - 24.9 uIU/mL  Lipid panel     Status: Abnormal   Collection Time: 09/14/20  9:25 AM  Result Value Ref Range   Cholesterol, Total 179 100 - 199 mg/dL   Triglycerides 122 0 - 149 mg/dL   HDL 51 >39 mg/dL   VLDL Cholesterol Cal 22 5 - 40 mg/dL   LDL Chol Calc (NIH) 106 (H) 0 - 99 mg/dL   Chol/HDL Ratio 3.5 0.0 - 4.4 ratio    Comment:                                   T. Chol/HDL Ratio                                             Men  Women                               1/2 Avg.Risk  3.4    3.3                                   Avg.Risk  5.0    4.4                                2X Avg.Risk  9.6    7.1                                3X Avg.Risk 23.4   11.0   VITAMIN D 25 Hydroxy (Vit-D Deficiency, Fractures)     Status: None   Collection  Time: 09/14/20  9:25 AM  Result Value Ref Range  Vit D, 25-Hydroxy 41.9 30.0 - 100.0 ng/mL    Comment: Vitamin D deficiency has been defined by the [place] practice guideline as a level of serum 25-OH vitamin D less than 20 ng/mL (1,2). The Endocrine Society went on to further define vitamin D insufficiency as a level between 21 and 29 ng/mL (2). 1. IOM (Institute of Medicine). 2010. Dietary reference    intakes for calcium and D. [place]: The    [employer]. 2. Holick MF, Binkley [place], Bischoff-Ferrari HA, et al.    Evaluation, treatment, and prevention of vitamin D    deficiency: an Endocrine Society clinical practice    guideline. JCEM. 2011 Jul; 96(7):1911-30.    Objective  Body mass index is 53.27 kg/m. Wt Readings from Last 3 Encounters:  11/11/20 (!) 320 lb 1.9 oz (145.2 kg)  10/19/20 (!) 321 lb (145.6 kg)  10/01/20 (!) 320 lb (145.2 kg)   Temp Readings from Last 3 Encounters:  11/11/20 97.7 F (36.5 C) (Temporal)  10/19/20 98.1 F (36.7 C)  10/01/20 97.7 F (36.5 C)   BP Readings from Last 3 Encounters:  11/11/20 (!) 140/96  10/19/20 (!) 148/88  10/01/20 (!) 125/91   Pulse Readings from Last 3 Encounters:  10/19/20 66  10/01/20 71  09/14/20 71    Physical Exam Vitals and nursing note reviewed.  Constitutional:      Appearance: Normal appearance. She is well-developed and well-groomed. She is morbidly obese.  HENT:     Head: Normocephalic and atraumatic.  Eyes:     Conjunctiva/sclera: Conjunctivae normal.     Pupils: Pupils are equal, round, and reactive to light.  Cardiovascular:     Rate and Rhythm: Normal rate and regular rhythm.     Heart sounds: Normal heart sounds. No murmur heard. Pulmonary:     Effort: Pulmonary effort is normal.     Breath sounds: Normal breath sounds.  Abdominal:     General: Abdomen is flat. Bowel sounds are normal.     Tenderness: There is no abdominal tenderness.   Musculoskeletal:        General: No tenderness.  Skin:    General: Skin is warm and dry.  Neurological:     General: No focal deficit present.     Mental Status: She is alert and oriented to person, place, and time. Mental status is at baseline.     Cranial Nerves: Cranial nerves 2-12 are intact.     Gait: Gait is intact.  Psychiatric:        Attention and Perception: Attention and perception normal.        Mood and Affect: Mood and affect normal.        Speech: Speech normal.        Behavior: Behavior normal. Behavior is cooperative.        Thought Content: Thought content normal.        Cognition and Memory: Cognition and memory normal.        Judgment: Judgment normal.    Assessment  Plan  Annual physical exam Declines flu shot  covid 3/3 consider boosters last 10/09/20 per pt Tdap in future at health dept Check hep B status in future currently pt w/o insurance    HIV neg 10/22/10 declines STD check   Hep C declines    LMP 2012 s/p hysterectomy both ovaries removed previously -CT ab/pelvis 10/19/10 c/w complex cystic mass in uterus with elevated CA 125 -per path report  removal uterus, b/l fallopian tubes and cervix neg malignancy or dysplasia 10/2010 Dr. Daiva Huge    Former smoker  Rec reduce etoh intake   rec take Mvt with iron    mammo due 02/25/2019 [facility] negative  Ordered 2022    Egd/colonoscopy 01/01/19 diverticulosis, EGD no bxs f/u in 10 years colonos. She also has    rec healthy diet and exercise      Pain in both feet - Plan: Ambulatory referral to Podiatry Dr. Laymond Purser    Essential hypertension w/o meds today elevated- Plan: spironolactone (ALDACTONE) 50 MG tablet, losartan (COZAAR) 50 MG tablet, chlorthalidone (HYGROTON) 25 MG tablet, amLODipine (NORVASC) 10 MG tablet Stress compliancewith meds   Type 2 diabetes mellitus with hyperglycemia, without long-term current use of insulin ([place]) 09/14/20 A1c 6.7 - Plan: metFORMIN (GLUCOPHAGE) 500 MG tablet On  ozempic 0.5 weekly consider increase if can tolerate to 1 mg via wt loss clinic appt 11/17/20   Glaucoma, unspecified glaucoma type, unspecified laterality - Plan: Ambulatory referral to Ophthalmology [facility] Dr. Gala Romney    Provider: Dr. Olivia Mackie McLean-Scocuzza-Internal Medicine

## 2020-11-11 NOTE — Patient Instructions (Addendum)
[facility]  [address]  [place] [address]  [phone]  Tdap consider at [facility] Department   [phone] Not available [address]   [address]   [place] [address]      Specialties     Podiatry

## 2020-11-15 DIAGNOSIS — M79671 Pain in right foot: Secondary | ICD-10-CM | POA: Insufficient documentation

## 2020-11-15 DIAGNOSIS — M79672 Pain in left foot: Secondary | ICD-10-CM | POA: Insufficient documentation

## 2020-11-15 HISTORY — DX: Pain in right foot: M79.671

## 2020-11-17 ENCOUNTER — Encounter (INDEPENDENT_AMBULATORY_CARE_PROVIDER_SITE_OTHER): Payer: Self-pay | Admitting: Physician Assistant

## 2020-11-17 ENCOUNTER — Ambulatory Visit (INDEPENDENT_AMBULATORY_CARE_PROVIDER_SITE_OTHER): Payer: 59 | Admitting: Physician Assistant

## 2020-11-17 ENCOUNTER — Other Ambulatory Visit: Payer: Self-pay

## 2020-11-17 VITALS — BP 120/80 | HR 71 | Temp 98.5°F | Ht 65.0 in | Wt 316.0 lb

## 2020-11-17 DIAGNOSIS — Z9189 Other specified personal risk factors, not elsewhere classified: Secondary | ICD-10-CM | POA: Diagnosis not present

## 2020-11-17 DIAGNOSIS — Z6841 Body Mass Index (BMI) 40.0 and over, adult: Secondary | ICD-10-CM | POA: Diagnosis not present

## 2020-11-17 DIAGNOSIS — E1165 Type 2 diabetes mellitus with hyperglycemia: Secondary | ICD-10-CM

## 2020-11-17 MED ORDER — SEMAGLUTIDE (1 MG/DOSE) 4 MG/3ML ~~LOC~~ SOPN: 1.0000 mg | PEN_INJECTOR | SUBCUTANEOUS | 0 refills | Status: DC

## 2020-11-17 NOTE — Progress Notes (Signed)
Chief Complaint:   OBESITY Maria Quinn is here to discuss her progress with her obesity treatment plan along with follow-up of her obesity related diagnoses. Maria Quinn is on the [employer] and states she is following her eating plan approximately 50% of the time. Maria Quinn states she is doing 0 minutes 0 times per week.  Today's visit was #: 10 Starting weight: 328 lbs Starting date: 04/13/2020 Today's weight: 316 lbs Today's date: 11/17/2020 Total lbs lost to date: 12 Total lbs lost since last in-office visit: 5  Interim History: Maria Quinn is back to eating 3 meals a day. She has been focusing on eating mainly protein and veggies. She is going on a cruise for a week in a few days.  Subjective:   1. Type 2 diabetes mellitus with hyperglycemia, without long-term current use of insulin (HCC) Maria Quinn is on Ozempic 0.5 mg, and her appetite is not fully controlled.  2. At risk for hypoglycemia Maria Quinn is at increased risk for hypoglycemia due to changes in diet, diagnosis of diabetes, and/or insulin use.   Assessment/Plan:   1. Type 2 diabetes mellitus with hyperglycemia, without long-term current use of insulin (HCC) Maria Quinn agreed to increase Ozempic to 1 mg q weekly, and we will refill for 1 month. Good blood sugar control is important to decrease the likelihood of diabetic complications such as nephropathy, neuropathy, limb loss, blindness, coronary artery disease, and death. Intensive lifestyle modification including diet, exercise and weight loss are the first line of treatment for diabetes.   2. At risk for hypoglycemia Maria Quinn was given approximately 15 minutes of counseling today regarding prevention of hypoglycemia. She was advised of symptoms of hypoglycemia. Maria Quinn was instructed to avoid skipping meals, eat regular protein rich meals and schedule low calorie snacks as needed.   Repetitive spaced learning was employed today to elicit superior memory formation and behavioral change  3. Obesity  with current BMI of 52.59 Maria Quinn is currently in the action stage of change. As such, her goal is to continue with weight loss efforts. She has agreed to the [employer].   Exercise goals: No exercise has been prescribed at this time.  Behavioral modification strategies: meal planning and cooking strategies and keeping healthy foods in the home.  Maria Quinn has agreed to follow-up with our clinic in 3 weeks. She was informed of the importance of frequent follow-up visits to maximize her success with intensive lifestyle modifications for her multiple health conditions.   Objective:   Blood pressure 120/80, pulse 71, temperature 98.5 F (36.9 C), height 5\' 5"  (1.651 m), weight (!) 316 lb (143.3 kg), last menstrual period 10/19/2010, SpO2 97 %. Body mass index is 52.59 kg/m.  General: Cooperative, alert, well developed, in no acute distress. HEENT: Conjunctivae and lids unremarkable. Cardiovascular: Regular rhythm.  Lungs: Normal work of breathing. Neurologic: No focal deficits.   Lab Results  Component Value Date   CREATININE 0.89 09/14/2020   BUN 14 09/14/2020   NA 142 09/14/2020   K 3.9 09/14/2020   CL 102 09/14/2020   CO2 24 09/14/2020   Lab Results  Component Value Date   ALT 14 09/14/2020   AST 13 09/14/2020   ALKPHOS 51 09/14/2020   BILITOT 0.4 09/14/2020   Lab Results  Component Value Date   HGBA1C 6.7 (H) 09/14/2020   HGBA1C 7.7 (H) 04/13/2020   HGBA1C 6.4 01/29/2018   Lab Results  Component Value Date   INSULIN 17.8 09/14/2020   INSULIN 24.1 04/13/2020   Lab  Results  Component Value Date   TSH 1.490 04/13/2020   Lab Results  Component Value Date   CHOL 179 09/14/2020   HDL 51 09/14/2020   LDLCALC 106 (H) 09/14/2020   TRIG 122 09/14/2020   CHOLHDL 3.5 09/14/2020   Lab Results  Component Value Date   VD25OH 41.9 09/14/2020   VD25OH 25.7 (L) 04/13/2020   Lab Results  Component Value Date   WBC 6.2 04/13/2020   HGB 12.5 04/13/2020   HCT 41.3  04/13/2020   MCV 76 (L) 04/13/2020   PLT 240 04/13/2020   Lab Results  Component Value Date   IRON 77 04/13/2020   TIBC 334 04/13/2020   FERRITIN 88 04/13/2020   Attestation Statements:   Reviewed by clinician on day of visit: allergies, medications, problem list, medical history, surgical history, family history, social history, and previous encounter notes.   Trude Mcburney, am acting as transcriptionist for [employer], PA-C.  I have reviewed the above documentation for accuracy and completeness, and I agree with the above. Alois Cliche, PA-C

## 2020-11-26 ENCOUNTER — Ambulatory Visit: Payer: 59 | Admitting: Podiatry

## 2020-12-01 ENCOUNTER — Other Ambulatory Visit: Payer: Self-pay | Admitting: Podiatry

## 2020-12-01 ENCOUNTER — Ambulatory Visit (INDEPENDENT_AMBULATORY_CARE_PROVIDER_SITE_OTHER): Payer: 59 | Admitting: Podiatry

## 2020-12-01 ENCOUNTER — Other Ambulatory Visit: Payer: Self-pay

## 2020-12-01 ENCOUNTER — Ambulatory Visit (INDEPENDENT_AMBULATORY_CARE_PROVIDER_SITE_OTHER): Payer: 59

## 2020-12-01 DIAGNOSIS — M722 Plantar fascial fibromatosis: Secondary | ICD-10-CM

## 2020-12-01 DIAGNOSIS — M79671 Pain in right foot: Secondary | ICD-10-CM

## 2020-12-07 ENCOUNTER — Encounter: Payer: Self-pay | Admitting: Podiatry

## 2020-12-07 NOTE — Progress Notes (Signed)
Subjective:  Patient ID: Maria Quinn, female    DOB: 11-16-1980,  MRN: [id number]  Chief Complaint  Patient presents with   Foot Pain    Bilateral foot pain left foot is worse     40 y.o. female presents with the above complaint.  Patient presents with complaint bilateral heel pain left slightly worse than right side.  Patient states painful to touch mostly in the heel first thing in the morning after sitting for long time and getting up.  She experiences post static dyskinesia type of symptoms.  She would like to discuss treatment options.  Pain scale 7 out of 10 sharp shooting in nature.  She has not seen MRIs prior to seeing me.  She has not tried any other treatment options.   Review of Systems: Negative except as noted in the HPI. Denies N/V/F/Ch.  Past Medical History:  Diagnosis Date   Anemia    Anxiety    Arthritis    Back pain    Chicken pox    Depression    Exocrine pancreatic insufficiency    Gastric bypass for obesity complicating pregnancy, birth, puerperium    Hyperlipidemia    Hypertension    Hypertension associated with diabetes (HCC)    OSA (obstructive sleep apnea)    Other fatigue    Pre-diabetes    Rheumatoid arthritis (HCC)    Sleep apnea    SOB (shortness of breath) on exertion    Swelling of both lower extremities    Vitamin D deficiency     Current Outpatient Medications:    amLODipine (NORVASC) 10 MG tablet, Take 1 tablet (10 mg total) by mouth daily., Disp: 90 tablet, Rfl: 3   Ascorbic Acid (VITAMIN C PO), Take 1,000 mg by mouth daily., Disp: , Rfl:    blood glucose meter kit and supplies, Dispense based on patient and insurance preference. Use up to two times daily as directed. (FOR ICD-10 E10.9, E11.9). 1 year supplies, Disp: 1 each, Rfl: 0   chlorthalidone (HYGROTON) 25 MG tablet, Take 1 tablet (25 mg total) by mouth daily., Disp: 90 tablet, Rfl: 3   Cholecalciferol (VITAMIN D) 50 MCG (2000 UT) CAPS, Take by mouth daily., Disp: , Rfl:     losartan (COZAAR) 50 MG tablet, Take 1 tablet (50 mg total) by mouth daily. At night, Disp: 90 tablet, Rfl: 3   metFORMIN (GLUCOPHAGE) 500 MG tablet, Take 1 tablet (500 mg total) by mouth daily with breakfast., Disp: 90 tablet, Rfl: 3   Multiple Vitamins-Minerals (CENTRUM WOMEN PO), Take by mouth., Disp: , Rfl:    Semaglutide, 1 MG/DOSE, 4 MG/3ML SOPN, Inject 1 mg as directed once a week., Disp: 3 mL, Rfl: 0   spironolactone (ALDACTONE) 50 MG tablet, Take 0.5 tablets (25 mg total) by mouth daily., Disp: 90 tablet, Rfl: 3   Vitamin D, Ergocalciferol, (DRISDOL) 1.25 MG (50000 UNIT) CAPS capsule, Take 1 capsule (50,000 Units total) by mouth every 7 (seven) days., Disp: 4 capsule, Rfl: 0  Social History   Tobacco Use  Smoking Status Never  Smokeless Tobacco Never    Allergies  Allergen Reactions   Penicillins Rash   Objective:  There were no vitals filed for this visit. There is no height or weight on file to calculate BMI. Constitutional Well developed. Well nourished.  Vascular Dorsalis pedis pulses palpable bilaterally. Posterior tibial pulses palpable bilaterally. Capillary refill normal to all digits.  No cyanosis or clubbing noted. Pedal hair growth normal.  Neurologic Normal speech.  Oriented to person, place, and time. Epicritic sensation to light touch grossly present bilaterally.  Dermatologic Nails well groomed and normal in appearance. No open wounds. No skin lesions.  Orthopedic: Normal joint ROM without pain or crepitus bilaterally. No visible deformities. Tender to palpation at the calcaneal tuber bilaterally. No pain with calcaneal squeeze bilaterally. Ankle ROM diminished range of motion bilaterally. Silfverskiold Test: positive bilaterally.   Radiographs: Taken and reviewed. No acute fractures or dislocations. No evidence of stress fracture.  Plantar heel spur present. Posterior heel spur present.   Assessment:   1. Plantar fasciitis of right foot   2.  Plantar fasciitis of left foot    Plan:  Patient was evaluated and treated and all questions answered.  Plantar Fasciitis, bilaterally - XR reviewed as above.  - Educated on icing and stretching. Instructions given.  - Injection delivered to the plantar fascia as below. - DME: Plantar Fascial Brace - Pharmacologic management: None  Procedure: Injection Tendon/Ligament Location: Bilateral plantar fascia at the glabrous junction; medial approach. Skin Prep: alcohol Injectate: 0.5 cc 0.5% marcaine plain, 0.5 cc of 1% Lidocaine, 0.5 cc kenalog 10. Disposition: Patient tolerated procedure well. Injection site dressed with a band-aid.  No follow-ups on file.

## 2020-12-15 ENCOUNTER — Ambulatory Visit (INDEPENDENT_AMBULATORY_CARE_PROVIDER_SITE_OTHER): Payer: 59 | Admitting: Physician Assistant

## 2020-12-21 ENCOUNTER — Other Ambulatory Visit: Payer: Self-pay

## 2020-12-21 ENCOUNTER — Encounter (INDEPENDENT_AMBULATORY_CARE_PROVIDER_SITE_OTHER): Payer: Self-pay | Admitting: Physician Assistant

## 2020-12-21 ENCOUNTER — Ambulatory Visit (INDEPENDENT_AMBULATORY_CARE_PROVIDER_SITE_OTHER): Payer: 59 | Admitting: Physician Assistant

## 2020-12-21 VITALS — BP 140/82 | HR 69 | Temp 97.5°F | Ht 65.0 in | Wt 307.0 lb

## 2020-12-21 DIAGNOSIS — Z6841 Body Mass Index (BMI) 40.0 and over, adult: Secondary | ICD-10-CM

## 2020-12-21 DIAGNOSIS — Z9189 Other specified personal risk factors, not elsewhere classified: Secondary | ICD-10-CM

## 2020-12-21 DIAGNOSIS — E1165 Type 2 diabetes mellitus with hyperglycemia: Secondary | ICD-10-CM | POA: Diagnosis not present

## 2020-12-21 MED ORDER — SEMAGLUTIDE (1 MG/DOSE) 4 MG/3ML ~~LOC~~ SOPN: 1.0000 mg | PEN_INJECTOR | SUBCUTANEOUS | 0 refills | Status: DC

## 2020-12-21 NOTE — Progress Notes (Signed)
Chief Complaint:   OBESITY Maria Quinn is here to discuss her progress with her obesity treatment plan along with follow-up of her obesity related diagnoses. Maria Quinn is on the [employer] and states she is following her eating plan approximately 50% of the time. Maria Quinn states she is doing 0 minutes 0 times per week.  Today's visit was #: 11 Starting weight: 328 lbs Starting date: 04/13/2020 Today's weight: 307 lbs Today's date: 12/21/2020 Total lbs lost to date: 21 Total lbs lost since last in-office visit: 9  Interim History: Maria Quinn did very well with weight loss. She was on a cruise and she made good choices. She is also making good choices if she has to eat fast food.  Subjective:   1. Type 2 diabetes mellitus with hyperglycemia, without long-term current use of insulin (HCC) Maria Quinn notes Ozempic controls her appetite. She is not excessively hungry.  2. At risk for activity intolerance Maria Quinn is at risk for exercise intolerance due to not exercising currently.  Assessment/Plan:   1. Type 2 diabetes mellitus with hyperglycemia, without long-term current use of insulin (HCC) Maria Quinn will continue Ozempic 1 mg and we will refill for 1 month. Good blood sugar control is important to decrease the likelihood of diabetic complications such as nephropathy, neuropathy, limb loss, blindness, coronary artery disease, and death. Intensive lifestyle modification including diet, exercise and weight loss are the first line of treatment for diabetes.   - Semaglutide, 1 MG/DOSE, 4 MG/3ML SOPN; Inject 1 mg as directed once a week.  Dispense: 3 mL; Refill: 0  2. At risk for activity intolerance Maria Quinn was given approximately 15 minutes of exercise intolerance counseling today. She is 40 y.o. female and has risk factors exercise intolerance including obesity. We discussed intensive lifestyle modifications today with an emphasis on specific weight loss instructions and strategies. Maria Quinn will slowly increase  activity as tolerated.  Repetitive spaced learning was employed today to elicit superior memory formation and behavioral change.  3. Obesity with current BMI of 51.2 Maria Quinn is currently in the action stage of change. As such, her goal is to continue with weight loss efforts. She has agreed to keeping a food journal and adhering to recommended goals of 1200-1500 calories and 90-100 grams of protein daily and the Pescatarian Plan.   Exercise goals: No exercise has been prescribed at this time.  Behavioral modification strategies: no skipping meals and meal planning and cooking strategies.  Maria Quinn has agreed to follow-up with our clinic in 3 weeks. She was informed of the importance of frequent follow-up visits to maximize her success with intensive lifestyle modifications for her multiple health conditions.   Objective:   Blood pressure 140/82, pulse 69, temperature (!) 97.5 F (36.4 C), height 5\' 5"  (1.651 m), weight (!) 307 lb (139.3 kg), last menstrual period 10/19/2010, SpO2 97 %. Body mass index is 51.09 kg/m.  General: Cooperative, alert, well developed, in no acute distress. HEENT: Conjunctivae and lids unremarkable. Cardiovascular: Regular rhythm.  Lungs: Normal work of breathing. Neurologic: No focal deficits.   Lab Results  Component Value Date   CREATININE 0.89 09/14/2020   BUN 14 09/14/2020   NA 142 09/14/2020   K 3.9 09/14/2020   CL 102 09/14/2020   CO2 24 09/14/2020   Lab Results  Component Value Date   ALT 14 09/14/2020   AST 13 09/14/2020   ALKPHOS 51 09/14/2020   BILITOT 0.4 09/14/2020   Lab Results  Component Value Date   HGBA1C 6.7 (H)  09/14/2020   HGBA1C 7.7 (H) 04/13/2020   HGBA1C 6.4 01/29/2018   Lab Results  Component Value Date   INSULIN 17.8 09/14/2020   INSULIN 24.1 04/13/2020   Lab Results  Component Value Date   TSH 1.490 04/13/2020   Lab Results  Component Value Date   CHOL 179 09/14/2020   HDL 51 09/14/2020   LDLCALC 106 (H)  09/14/2020   TRIG 122 09/14/2020   CHOLHDL 3.5 09/14/2020   Lab Results  Component Value Date   VD25OH 41.9 09/14/2020   VD25OH 25.7 (L) 04/13/2020   Lab Results  Component Value Date   WBC 6.2 04/13/2020   HGB 12.5 04/13/2020   HCT 41.3 04/13/2020   MCV 76 (L) 04/13/2020   PLT 240 04/13/2020   Lab Results  Component Value Date   IRON 77 04/13/2020   TIBC 334 04/13/2020   FERRITIN 88 04/13/2020   Attestation Statements:   Reviewed by clinician on day of visit: allergies, medications, problem list, medical history, surgical history, family history, social history, and previous encounter notes.   Trude Mcburney, am acting as transcriptionist for [employer], PA-C.  I have reviewed the above documentation for accuracy and completeness, and I agree with the above. Alois Cliche, PA-C

## 2020-12-29 ENCOUNTER — Other Ambulatory Visit: Payer: Self-pay

## 2020-12-29 ENCOUNTER — Ambulatory Visit (INDEPENDENT_AMBULATORY_CARE_PROVIDER_SITE_OTHER): Payer: 59 | Admitting: Podiatry

## 2020-12-29 DIAGNOSIS — M722 Plantar fascial fibromatosis: Secondary | ICD-10-CM | POA: Diagnosis not present

## 2020-12-29 DIAGNOSIS — Q666 Other congenital valgus deformities of feet: Secondary | ICD-10-CM

## 2020-12-30 ENCOUNTER — Encounter: Payer: Self-pay | Admitting: Podiatry

## 2020-12-30 NOTE — Progress Notes (Signed)
Subjective:  Patient ID: Maria Quinn, female    DOB: 1980-10-17,  MRN: [id number]  Chief Complaint  Patient presents with   Plantar Fasciitis    Pt stated that she is still having some pain    40 y.o. female presents with the above complaint.  Patient presents with follow-up of bilateral Planter fasciitis.  She states the pain is much better.  She is about 70 to 80% better.  She would like to discuss next treatment plans.  She would also like to discuss shoe gear modification and orthotics.   Review of Systems: Negative except as noted in the HPI. Denies N/V/F/Ch.  Past Medical History:  Diagnosis Date   Anemia    Anxiety    Arthritis    Back pain    Chicken pox    Depression    Exocrine pancreatic insufficiency    Gastric bypass for obesity complicating pregnancy, birth, puerperium    Hyperlipidemia    Hypertension    Hypertension associated with diabetes (HCC)    OSA (obstructive sleep apnea)    Other fatigue    Pre-diabetes    Rheumatoid arthritis (HCC)    Sleep apnea    SOB (shortness of breath) on exertion    Swelling of both lower extremities    Vitamin D deficiency     Current Outpatient Medications:    amLODipine (NORVASC) 10 MG tablet, Take 1 tablet (10 mg total) by mouth daily., Disp: 90 tablet, Rfl: 3   Ascorbic Acid (VITAMIN C PO), Take 1,000 mg by mouth daily., Disp: , Rfl:    blood glucose meter kit and supplies, Dispense based on patient and insurance preference. Use up to two times daily as directed. (FOR ICD-10 E10.9, E11.9). 1 year supplies, Disp: 1 each, Rfl: 0   chlorthalidone (HYGROTON) 25 MG tablet, Take 1 tablet (25 mg total) by mouth daily., Disp: 90 tablet, Rfl: 3   Cholecalciferol (VITAMIN D) 50 MCG (2000 UT) CAPS, Take by mouth daily., Disp: , Rfl:    losartan (COZAAR) 50 MG tablet, Take 1 tablet (50 mg total) by mouth daily. At night, Disp: 90 tablet, Rfl: 3   metFORMIN (GLUCOPHAGE) 500 MG tablet, Take 1 tablet (500 mg total) by mouth daily  with breakfast., Disp: 90 tablet, Rfl: 3   Multiple Vitamins-Minerals (CENTRUM WOMEN PO), Take by mouth., Disp: , Rfl:    Semaglutide, 1 MG/DOSE, 4 MG/3ML SOPN, Inject 1 mg as directed once a week., Disp: 3 mL, Rfl: 0   spironolactone (ALDACTONE) 50 MG tablet, Take 0.5 tablets (25 mg total) by mouth daily., Disp: 90 tablet, Rfl: 3   Vitamin D, Ergocalciferol, (DRISDOL) 1.25 MG (50000 UNIT) CAPS capsule, Take 1 capsule (50,000 Units total) by mouth every 7 (seven) days., Disp: 4 capsule, Rfl: 0  Social History   Tobacco Use  Smoking Status Never  Smokeless Tobacco Never    Allergies  Allergen Reactions   Penicillins Rash   Objective:  There were no vitals filed for this visit. There is no height or weight on file to calculate BMI. Constitutional Well developed. Well nourished.  Vascular Dorsalis pedis pulses palpable bilaterally. Posterior tibial pulses palpable bilaterally. Capillary refill normal to all digits.  No cyanosis or clubbing noted. Pedal hair growth normal.  Neurologic Normal speech. Oriented to person, place, and time. Epicritic sensation to light touch grossly present bilaterally.  Dermatologic Nails well groomed and normal in appearance. No open wounds. No skin lesions.  Orthopedic: Normal joint ROM without pain or  crepitus bilaterally. No visible deformities. Tender to palpation at the calcaneal tuber bilaterally. No pain with calcaneal squeeze bilaterally. Ankle ROM diminished range of motion bilaterally. Silfverskiold Test: positive bilaterally.   Radiographs: Taken and reviewed. No acute fractures or dislocations. No evidence of stress fracture.  Plantar heel spur present. Posterior heel spur present.   Assessment:   1. Pes planovalgus   2. Plantar fasciitis of right foot   3. Plantar fasciitis of left foot     Plan:  Patient was evaluated and treated and all questions answered.  Plantar Fasciitis, bilaterally - XR reviewed as above.  -  Educated on icing and stretching. Instructions given.  -Second injection delivered to the plantar fascia as below. - DME: Plantar Fascial Brace - Pharmacologic management: None  Pes planovalgus -I explained the patient the etiology of pes planovalgus and various treatment options were discussed.  Given the amount of pain that she is having in setting of Planter fasciitis she would technically benefit from custom-made orthotics however she would like to try over-the-counter for now.  I discussed with her that given her foot structure she would benefit from custom-made orthotics.  However I discussed with her we can try power steps over-the-counter to see if she gets some relief.  She states understanding and would like to proceed with that.  Procedure: Injection Tendon/Ligament Location: Bilateral plantar fascia at the glabrous junction; medial approach. Skin Prep: alcohol Injectate: 0.5 cc 0.5% marcaine plain, 0.5 cc of 1% Lidocaine, 0.5 cc kenalog 10. Disposition: Patient tolerated procedure well. Injection site dressed with a band-aid.  No follow-ups on file.

## 2021-01-13 ENCOUNTER — Encounter (INDEPENDENT_AMBULATORY_CARE_PROVIDER_SITE_OTHER): Payer: Self-pay

## 2021-01-17 ENCOUNTER — Encounter (INDEPENDENT_AMBULATORY_CARE_PROVIDER_SITE_OTHER): Payer: Self-pay | Admitting: Physician Assistant

## 2021-01-17 ENCOUNTER — Ambulatory Visit (INDEPENDENT_AMBULATORY_CARE_PROVIDER_SITE_OTHER): Payer: 59 | Admitting: Physician Assistant

## 2021-01-17 ENCOUNTER — Other Ambulatory Visit: Payer: Self-pay

## 2021-01-17 VITALS — BP 132/84 | HR 73 | Temp 98.1°F | Ht 65.0 in | Wt 306.0 lb

## 2021-01-17 DIAGNOSIS — Z9189 Other specified personal risk factors, not elsewhere classified: Secondary | ICD-10-CM

## 2021-01-17 DIAGNOSIS — E559 Vitamin D deficiency, unspecified: Secondary | ICD-10-CM

## 2021-01-17 DIAGNOSIS — E669 Obesity, unspecified: Secondary | ICD-10-CM | POA: Diagnosis not present

## 2021-01-17 DIAGNOSIS — E1165 Type 2 diabetes mellitus with hyperglycemia: Secondary | ICD-10-CM | POA: Diagnosis not present

## 2021-01-17 DIAGNOSIS — Z6841 Body Mass Index (BMI) 40.0 and over, adult: Secondary | ICD-10-CM | POA: Diagnosis not present

## 2021-01-17 MED ORDER — SEMAGLUTIDE (1 MG/DOSE) 4 MG/3ML ~~LOC~~ SOPN: 1.0000 mg | PEN_INJECTOR | SUBCUTANEOUS | 0 refills | Status: DC

## 2021-01-17 NOTE — Progress Notes (Signed)
Chief Complaint:   OBESITY Maria Quinn is here to discuss her progress with her obesity treatment plan along with follow-up of her obesity related diagnoses. [name] is on keeping a food journal and adhering to recommended goals of 1200-1500 calories and 100 grams of protein and the [employer] and states she is following her eating plan approximately 75% of the time. [name] states she is doing Arts administrator for 20 minutes 2-3 times per week.  Today's visit was #: 12 Starting weight: 328 lbs Starting date: 04/13/2020 Today's weight: 306 lbs Today's date: 01/17/2021 Total lbs lost to date: 22 lbs Total lbs lost since last in-office visit: 1 lb  Interim History: [name] has been drinking more water. She has been eating less sweets and more salads. She has not been tracking her food.  Subjective:   1. Type 2 diabetes mellitus with hyperglycemia, without long-term current use of insulin (HCC) [name] is currently on Ozempic 1 mg and she is tolerating it well.  2. Vitamin D deficiency [name] is on OTC Vitamin D. She is not taking 50,000 units.   3. At risk for impaired metabolic function [name] is at increased risk for impaired metabolic function due to not eating enough calories.   Assessment/Plan:   1. Type 2 diabetes mellitus with hyperglycemia, without long-term current use of insulin (HCC) We will refill Ozempic 1 mg with no refills. Good blood sugar control is important to decrease the likelihood of diabetic complications such as nephropathy, neuropathy, limb loss, blindness, coronary artery disease, and death. Intensive lifestyle modification including diet, exercise and weight loss are the first line of treatment for diabetes.   - Semaglutide, 1 MG/DOSE, 4 MG/3ML SOPN; Inject 1 mg as directed once a week.  Dispense: 3 mL; Refill: 0  2. Vitamin D deficiency Low Vitamin D level contributes to fatigue and are associated with obesity, breast, and colon cancer. We will check labs today.  [name] will continue OTC Vitamin D and she will follow-up for routine testing of Vitamin D, at least 2-3 times per year to avoid over-replacement.  3. At risk for impaired metabolic function [name] was given approximately 15 minutes of impaired  metabolic function prevention counseling today. We discussed intensive lifestyle modifications today with an emphasis on specific nutrition and exercise instructions and strategies.   Repetitive spaced learning was employed today to elicit superior memory formation and behavioral change.   4. Obesity with current BMI of 50.92 [name] is currently in the action stage of change. As such, her goal is to continue with weight loss efforts. She has agreed to keeping a food journal and adhering to recommended goals of 1500 calories and 100 grams of protein daily.  We will get labs at next office visit.  Exercise goals:  As is.  Behavioral modification strategies: meal planning and cooking strategies and planning for success.  [name] has agreed to follow-up with our clinic in 3 weeks. She was informed of the importance of frequent follow-up visits to maximize her success with intensive lifestyle modifications for her multiple health conditions.   Objective:   Blood pressure 132/84, pulse 73, temperature 98.1 F (36.7 C), height 5\' 5"  (1.651 m), weight (!) 306 lb (138.8 kg), last menstrual period 10/19/2010, SpO2 96 %. Body mass index is 50.92 kg/m.  General: Cooperative, alert, well developed, in no acute distress. HEENT: Conjunctivae and lids unremarkable. Cardiovascular: Regular rhythm.  Lungs: Normal work of breathing. Neurologic: No focal deficits.   Lab Results  Component Value  Date   CREATININE 0.89 09/14/2020   BUN 14 09/14/2020   NA 142 09/14/2020   K 3.9 09/14/2020   CL 102 09/14/2020   CO2 24 09/14/2020   Lab Results  Component Value Date   ALT 14 09/14/2020   AST 13 09/14/2020   ALKPHOS 51 09/14/2020   BILITOT 0.4 09/14/2020   Lab  Results  Component Value Date   HGBA1C 6.7 (H) 09/14/2020   HGBA1C 7.7 (H) 04/13/2020   HGBA1C 6.4 01/29/2018   Lab Results  Component Value Date   INSULIN 17.8 09/14/2020   INSULIN 24.1 04/13/2020   Lab Results  Component Value Date   TSH 1.490 04/13/2020   Lab Results  Component Value Date   CHOL 179 09/14/2020   HDL 51 09/14/2020   LDLCALC 106 (H) 09/14/2020   TRIG 122 09/14/2020   CHOLHDL 3.5 09/14/2020   Lab Results  Component Value Date   VD25OH 41.9 09/14/2020   VD25OH 25.7 (L) 04/13/2020   Lab Results  Component Value Date   WBC 6.2 04/13/2020   HGB 12.5 04/13/2020   HCT 41.3 04/13/2020   MCV 76 (L) 04/13/2020   PLT 240 04/13/2020   Lab Results  Component Value Date   IRON 77 04/13/2020   TIBC 334 04/13/2020   FERRITIN 88 04/13/2020   Attestation Statements:   Reviewed by clinician on day of visit: allergies, medications, problem list, medical history, surgical history, family history, social history, and previous encounter notes.  I, Tonye Pearson, am acting as Location manager for [employer], PA-C.  I have reviewed the above documentation for accuracy and completeness, and I agree with the above. Abby Potash, PA-C

## 2021-02-02 ENCOUNTER — Telehealth (INDEPENDENT_AMBULATORY_CARE_PROVIDER_SITE_OTHER): Payer: Self-pay

## 2021-02-02 NOTE — Telephone Encounter (Signed)
Pt called in and stated that she was calling in asking the status of the PA for the ozempic. PT would like a call back. Please advise

## 2021-02-03 NOTE — Telephone Encounter (Signed)
Maria Quinn 

## 2021-02-03 NOTE — Telephone Encounter (Signed)
Prior Maria Quinn has been submitted via cover my meds.  Pt brought new insurance card today to be scanned.    [facility] has not yet replied to your PA request. You may close this dialog, return to your dashboard, and perform other tasks.  To check for an update later, open this request again from your dashboard.  If [facility] has not replied within 72 hours for urgent requests and up to 15 days for standard requests, please contact [facility] at [phone].

## 2021-02-07 ENCOUNTER — Other Ambulatory Visit: Payer: Self-pay

## 2021-02-07 ENCOUNTER — Ambulatory Visit (INDEPENDENT_AMBULATORY_CARE_PROVIDER_SITE_OTHER): Payer: 59 | Admitting: Physician Assistant

## 2021-02-07 ENCOUNTER — Encounter (INDEPENDENT_AMBULATORY_CARE_PROVIDER_SITE_OTHER): Payer: Self-pay | Admitting: Physician Assistant

## 2021-02-07 VITALS — BP 138/82 | HR 71 | Temp 98.3°F | Ht 65.0 in | Wt 303.0 lb

## 2021-02-07 DIAGNOSIS — E669 Obesity, unspecified: Secondary | ICD-10-CM | POA: Diagnosis not present

## 2021-02-07 DIAGNOSIS — Z6841 Body Mass Index (BMI) 40.0 and over, adult: Secondary | ICD-10-CM

## 2021-02-07 DIAGNOSIS — E1165 Type 2 diabetes mellitus with hyperglycemia: Secondary | ICD-10-CM | POA: Diagnosis not present

## 2021-02-07 DIAGNOSIS — Z9189 Other specified personal risk factors, not elsewhere classified: Secondary | ICD-10-CM

## 2021-02-07 MED ORDER — SEMAGLUTIDE (1 MG/DOSE) 4 MG/3ML ~~LOC~~ SOPN: 1.0000 mg | PEN_INJECTOR | SUBCUTANEOUS | 0 refills | Status: DC

## 2021-02-07 NOTE — Progress Notes (Signed)
Chief Complaint:   OBESITY Maria Quinn is here to discuss her progress with her obesity treatment plan along with follow-up of her obesity related diagnoses. Maria Quinn is on keeping a food journal and adhering to recommended goals of 1500 calories and 95 grams of protein daily and states she is following her eating plan approximately 70% of the time. Maria Quinn states she is doing zumb, YouTube, and exercising with a person trainer for 45-60 minutes 4 times per week.  Today's visit was #: 13 Starting weight: 328 lbs Starting date: 04/13/2020 Today's weight: 303 lbs Today's date: 02/07/2021 Total lbs lost to date: 25 Total lbs lost since last in-office visit: 3  Interim History: Maria Quinn started writing down her food and her feelings daily. She has been setting alarms to remind her to eat. She is weighing her protein to ensure that she is getting enough. She is not counting her calories or protein and she prefers not to.   Subjective:   1. Type 2 diabetes mellitus with hyperglycemia, without long-term current use of insulin (HCC) Maria Quinn's last A1c was 6.7. She is on Ozempic 1 mg. She denies an appetite, and she is tolerating Ozempic well.  2. At risk for hypoglycemia Maria Quinn is at increased risk for hypoglycemia due to changes in diet, diagnosis of diabetes, and/or insulin use.   Assessment/Plan:   1. Type 2 diabetes mellitus with hyperglycemia, without long-term current use of insulin (HCC) We will refill Ozempic 1 mg for 1 month. Good blood sugar control is important to decrease the likelihood of diabetic complications such as nephropathy, neuropathy, limb loss, blindness, coronary artery disease, and death. Intensive lifestyle modification including diet, exercise and weight loss are the first line of treatment for diabetes.   - Semaglutide, 1 MG/DOSE, 4 MG/3ML SOPN; Inject 1 mg as directed once a week.  Dispense: 3 mL; Refill: 0  2. At risk for hypoglycemia Maria Quinn was given approximately 15 minutes  of counseling today regarding prevention of hypoglycemia. She was advised of hypoglycemia causes and the fact hypoglycemia is often asymptomatic. Maria Quinn was instructed to avoid skipping meals, eat regular protein rich meals and schedule low calorie but protein rich snacks as needed.   Repetitive spaced learning was employed today to elicit superior memory formation and behavioral change  3. Morbid Obesity with current BMI of 50.42 Maria Quinn is currently in the action stage of change. As such, her goal is to continue with weight loss efforts. She has agreed to keeping a food journal and adhering to recommended goals of 1500 calories and 100 grams of protein daily.   Exercise goals: As is.  Behavioral modification strategies: no skipping meals and planning for success.  Maria Quinn has agreed to follow-up with our clinic in 4 weeks. She was informed of the importance of frequent follow-up visits to maximize her success with intensive lifestyle modifications for her multiple health conditions.   Objective:   Blood pressure 138/82, pulse 71, temperature 98.3 F (36.8 C), height 5\' 5"  (1.651 m), weight (!) 303 lb (137.4 kg), last menstrual period 10/19/2010, SpO2 97 %. Body mass index is 50.42 kg/m.  General: Cooperative, alert, well developed, in no acute distress. HEENT: Conjunctivae and lids unremarkable. Cardiovascular: Regular rhythm.  Lungs: Normal work of breathing. Neurologic: No focal deficits.   Lab Results  Component Value Date   CREATININE 0.89 09/14/2020   BUN 14 09/14/2020   NA 142 09/14/2020   K 3.9 09/14/2020   CL 102 09/14/2020   CO2 24 09/14/2020  Lab Results  Component Value Date   ALT 14 09/14/2020   AST 13 09/14/2020   ALKPHOS 51 09/14/2020   BILITOT 0.4 09/14/2020   Lab Results  Component Value Date   HGBA1C 6.7 (H) 09/14/2020   HGBA1C 7.7 (H) 04/13/2020   HGBA1C 6.4 01/29/2018   Lab Results  Component Value Date   INSULIN 17.8 09/14/2020   INSULIN 24.1  04/13/2020   Lab Results  Component Value Date   TSH 1.490 04/13/2020   Lab Results  Component Value Date   CHOL 179 09/14/2020   HDL 51 09/14/2020   LDLCALC 106 (H) 09/14/2020   TRIG 122 09/14/2020   CHOLHDL 3.5 09/14/2020   Lab Results  Component Value Date   VD25OH 41.9 09/14/2020   VD25OH 25.7 (L) 04/13/2020   Lab Results  Component Value Date   WBC 6.2 04/13/2020   HGB 12.5 04/13/2020   HCT 41.3 04/13/2020   MCV 76 (L) 04/13/2020   PLT 240 04/13/2020   Lab Results  Component Value Date   IRON 77 04/13/2020   TIBC 334 04/13/2020   FERRITIN 88 04/13/2020   Attestation Statements:   Reviewed by clinician on day of visit: allergies, medications, problem list, medical history, surgical history, family history, social history, and previous encounter notes.   Trude Mcburney, am acting as transcriptionist for [employer], PA-C.  I have reviewed the above documentation for accuracy and completeness, and I agree with the above. Alois Cliche, PA-C

## 2021-02-08 NOTE — Telephone Encounter (Signed)
Prior auth for Ozempic has been approved. Coverage dates:  02/03/21 until 02/03/2022  Pt notified via my chart

## 2021-02-26 ENCOUNTER — Other Ambulatory Visit: Payer: Self-pay | Admitting: Internal Medicine

## 2021-02-26 DIAGNOSIS — I10 Essential (primary) hypertension: Secondary | ICD-10-CM

## 2021-03-09 ENCOUNTER — Encounter (INDEPENDENT_AMBULATORY_CARE_PROVIDER_SITE_OTHER): Payer: Self-pay | Admitting: Physician Assistant

## 2021-03-09 ENCOUNTER — Ambulatory Visit (INDEPENDENT_AMBULATORY_CARE_PROVIDER_SITE_OTHER): Payer: 59 | Admitting: Physician Assistant

## 2021-03-09 ENCOUNTER — Other Ambulatory Visit: Payer: Self-pay

## 2021-03-09 VITALS — BP 138/86 | HR 68 | Temp 98.1°F | Ht 65.0 in | Wt 307.0 lb

## 2021-03-09 DIAGNOSIS — E1165 Type 2 diabetes mellitus with hyperglycemia: Secondary | ICD-10-CM | POA: Diagnosis not present

## 2021-03-09 DIAGNOSIS — E559 Vitamin D deficiency, unspecified: Secondary | ICD-10-CM

## 2021-03-09 DIAGNOSIS — E785 Hyperlipidemia, unspecified: Secondary | ICD-10-CM | POA: Diagnosis not present

## 2021-03-09 DIAGNOSIS — Z6841 Body Mass Index (BMI) 40.0 and over, adult: Secondary | ICD-10-CM

## 2021-03-09 DIAGNOSIS — E1169 Type 2 diabetes mellitus with other specified complication: Secondary | ICD-10-CM

## 2021-03-09 DIAGNOSIS — Z9189 Other specified personal risk factors, not elsewhere classified: Secondary | ICD-10-CM

## 2021-03-09 DIAGNOSIS — Z7985 Long-term (current) use of injectable non-insulin antidiabetic drugs: Secondary | ICD-10-CM

## 2021-03-09 DIAGNOSIS — E669 Obesity, unspecified: Secondary | ICD-10-CM

## 2021-03-09 MED ORDER — SEMAGLUTIDE (1 MG/DOSE) 4 MG/3ML ~~LOC~~ SOPN: 1.0000 mg | PEN_INJECTOR | SUBCUTANEOUS | 0 refills | Status: DC

## 2021-03-09 NOTE — Progress Notes (Signed)
? ? ? ?Chief Complaint:  ? ?OBESITY ?Maria Quinn is here to discuss her progress with her obesity treatment plan along with follow-up of her obesity related diagnoses. Envi is on keeping a food journal and adhering to recommended goals of 1500 calories and 100 grams of protein and the Pescatarian Plan and states she is following her eating plan approximately 60% of the time. Maria Quinn states she is doing [employer] and walking for 30 minutes 4 times per week. ? ?Today's visit was #: 14 ?Starting weight: 328 lbs ?Starting date: 04/13/2020 ?Today's weight: 307 lbs ?Today's date: 03/09/2021 ?Total lbs lost to date: 21 lbs ?Total lbs lost since last in-office visit: 0 ? ?Interim History: Maria Quinn reports that she fell which made her less active for a few days. She didn't cook as much and had more "cheat days". She has been eating [employer], [employer] and pizza.  ? ?Subjective:  ? ?1. Type 2 diabetes mellitus with hyperglycemia, without long-term current use of insulin (HCC) ?Maria Quinn states Ozempic 1 mg is helping to decrease appetite. She is also on Metformin.  ? ?2. Hyperlipidemia associated with type 2 diabetes mellitus (HCC) ?Maria Quinn is not on medication currently. She notes decreased in activity in the last few weeks due to falling and being sore.  ? ?3. Vitamin D deficiency ?Maria Quinn is on Vitamin D weekly. She is taking her medications as prescribed.  ? ?4. At risk for nausea ?Maria Quinn is at risk for nausea due to taking Ozempic. ? ?Assessment/Plan:  ? ?1. Type 2 diabetes mellitus with hyperglycemia, without long-term current use of insulin (HCC) ?We will refill Ozempic 1 mg for 1 month with no refills. We will check labs today. Good blood sugar control is important to decrease the likelihood of diabetic complications such as nephropathy, neuropathy, limb loss, blindness, coronary artery disease, and death. Intensive lifestyle modification including diet, exercise and weight loss are the first line of treatment for diabetes.  ? ?-  Semaglutide, 1 MG/DOSE, 4 MG/3ML SOPN; Inject 1 mg as directed once a week.  Dispense: 3 mL; Refill: 0 ?- Comprehensive metabolic panel ?- Hemoglobin A1c ?- Insulin, random ? ?2. Hyperlipidemia associated with type 2 diabetes mellitus (HCC) ?Cardiovascular risk and specific lipid/LDL goals reviewed.  We will check labs today. We discussed several lifestyle modifications today and [name] will continue to work on diet, exercise and weight loss efforts. Orders and follow up as documented in patient record.  ? ?Counseling ?Intensive lifestyle modifications are the first line treatment for this issue. ?Dietary changes: Increase soluble fiber. Decrease simple carbohydrates. ?Exercise changes: Moderate to vigorous-intensity aerobic activity 150 minutes per week if tolerated. ?Lipid-lowering medications: see documented in medical record. ?- Lipid panel ? ?3. Vitamin D deficiency ?Low Vitamin D level contributes to fatigue and are associated with obesity, breast, and colon cancer. We will check labs today and Jezebelle will follow-up for routine testing of Vitamin D, at least 2-3 times per year to avoid over-replacement. ?- VITAMIN D 25 Hydroxy (Vit-D Deficiency, Fractures) ? ?4. At risk for nausea ?Maria Quinn was given approximately 15 minutes of nausea prevention counseling today. Maria Quinn is at risk for nausea due to her new or current medication. She was encouraged to titrate her medication slowly, make sure to stay hydrated, eat smaller portions throughout the day, and avoid high fat meals.   ? ?5. Obesity, with current BMI of 51.2 ?Maria Quinn is currently in the action stage of change. As such, her goal is to continue with weight loss efforts.  She has agreed to the Category 3 Plan and keeping a food journal and adhering to recommended goals of 1500 calories and 100 grams of protein daily.  ? ?Exercise goals:  As is. ? ?Behavioral modification strategies: increasing lean protein intake and meal planning and cooking  strategies. ? ?Maria Quinn has agreed to follow-up with our clinic in 3 weeks. She was informed of the importance of frequent follow-up visits to maximize her success with intensive lifestyle modifications for her multiple health conditions.  ? ?Objective:  ? ?Blood pressure 138/86, pulse 68, temperature 98.1 ?F (36.7 ?C), height 5\' 5"  (1.651 m), weight (!) 307 lb (139.3 kg), last menstrual period 10/19/2010, SpO2 99 %. ?Body mass index is 51.09 kg/m?. ? ?General: Cooperative, alert, well developed, in no acute distress. ?HEENT: Conjunctivae and lids unremarkable. ?Cardiovascular: Regular rhythm.  ?Lungs: Normal work of breathing. ?Neurologic: No focal deficits.  ? ?Lab Results  ?Component Value Date  ? CREATININE 0.89 09/14/2020  ? BUN 14 09/14/2020  ? NA 142 09/14/2020  ? K 3.9 09/14/2020  ? CL 102 09/14/2020  ? CO2 24 09/14/2020  ? ?Lab Results  ?Component Value Date  ? ALT 14 09/14/2020  ? AST 13 09/14/2020  ? ALKPHOS 51 09/14/2020  ? BILITOT 0.4 09/14/2020  ? ?Lab Results  ?Component Value Date  ? HGBA1C 6.7 (H) 09/14/2020  ? HGBA1C 7.7 (H) 04/13/2020  ? HGBA1C 6.4 01/29/2018  ? ?Lab Results  ?Component Value Date  ? INSULIN 17.8 09/14/2020  ? INSULIN 24.1 04/13/2020  ? ?Lab Results  ?Component Value Date  ? TSH 1.490 04/13/2020  ? ?Lab Results  ?Component Value Date  ? CHOL 179 09/14/2020  ? HDL 51 09/14/2020  ? LDLCALC 106 (H) 09/14/2020  ? TRIG 122 09/14/2020  ? CHOLHDL 3.5 09/14/2020  ? ?Lab Results  ?Component Value Date  ? VD25OH 41.9 09/14/2020  ? VD25OH 25.7 (L) 04/13/2020  ? ?Lab Results  ?Component Value Date  ? WBC 6.2 04/13/2020  ? HGB 12.5 04/13/2020  ? HCT 41.3 04/13/2020  ? MCV 76 (L) 04/13/2020  ? PLT 240 04/13/2020  ? ?Lab Results  ?Component Value Date  ? IRON 77 04/13/2020  ? TIBC 334 04/13/2020  ? FERRITIN 88 04/13/2020  ? ?Attestation Statements:  ? ?Reviewed by clinician on day of visit: allergies, medications, problem list, medical history, surgical history, family history, social history, and  previous encounter notes. ? ?I, 06/13/2020, am acting as Sindy Messing for Energy manager, PA-C. ? ?I have reviewed the above documentation for accuracy and completeness, and I agree with the above. [employer], PA-C ? ?

## 2021-03-10 LAB — COMPREHENSIVE METABOLIC PANEL
ALT: 12 IU/L (ref 0–32)
AST: 10 IU/L (ref 0–40)
Albumin/Globulin Ratio: 1.7 (ref 1.2–2.2)
Albumin: 4.1 g/dL (ref 3.8–4.8)
Alkaline Phosphatase: 50 IU/L (ref 44–121)
BUN/Creatinine Ratio: 19 (ref 9–23)
BUN: 16 mg/dL (ref 6–24)
Bilirubin Total: 0.4 mg/dL (ref 0.0–1.2)
CO2: 26 mmol/L (ref 20–29)
Calcium: 8.9 mg/dL (ref 8.7–10.2)
Chloride: 105 mmol/L (ref 96–106)
Creatinine, Ser: 0.84 mg/dL (ref 0.57–1.00)
Globulin, Total: 2.4 g/dL (ref 1.5–4.5)
Glucose: 89 mg/dL (ref 70–99)
Potassium: 4 mmol/L (ref 3.5–5.2)
Sodium: 146 mmol/L — ABNORMAL HIGH (ref 134–144)
Total Protein: 6.5 g/dL (ref 6.0–8.5)
eGFR: 89 mL/min/{1.73_m2} (ref >59)

## 2021-03-10 LAB — LIPID PANEL
Chol/HDL Ratio: 2.6 ratio (ref 0.0–4.4)
Cholesterol, Total: 160 mg/dL (ref 100–199)
HDL: 61 mg/dL (ref >39)
LDL Chol Calc (NIH): 82 mg/dL (ref 0–99)
Triglycerides: 89 mg/dL (ref 0–149)
VLDL Cholesterol Cal: 17 mg/dL (ref 5–40)

## 2021-03-10 LAB — VITAMIN D 25 HYDROXY (VIT D DEFICIENCY, FRACTURES): Vit D, 25-Hydroxy: 30.4 ng/mL (ref 30.0–100.0)

## 2021-03-10 LAB — INSULIN, RANDOM: INSULIN: 13 u[IU]/mL (ref 2.6–24.9)

## 2021-03-10 LAB — HEMOGLOBIN A1C
Est. average glucose Bld gHb Est-mCnc: 128 mg/dL
Hgb A1c MFr Bld: 6.1 % — ABNORMAL HIGH (ref 4.8–5.6)

## 2021-03-18 ENCOUNTER — Telehealth: Payer: Self-pay | Admitting: Internal Medicine

## 2021-03-18 NOTE — Telephone Encounter (Signed)
Please have pt call insurance and see who is covered for eye doctor?  ? ?

## 2021-03-18 NOTE — Telephone Encounter (Signed)
[facility] does not except pt ins. What other facility would you like for pt to go. I can also have pt check with her ins to see who they recommend she can see. Please place a new referral with the facility.Thank you! ?

## 2021-03-22 ENCOUNTER — Encounter: Payer: Self-pay | Admitting: Internal Medicine

## 2021-03-22 NOTE — Telephone Encounter (Signed)
Patient informed and verbalized understanding.  ?She will call her insurance to see who is covered and will send [place] a Clinical cytogeneticist message.  ?

## 2021-03-22 NOTE — Telephone Encounter (Signed)
Sometimes medical insurance covers 1 eye exam can you check with them?  ?You have hypertension and diabetes so this should be covered under medical please call your insurance and see who is in network with her network  ? ?

## 2021-03-30 ENCOUNTER — Other Ambulatory Visit: Payer: Self-pay

## 2021-03-30 ENCOUNTER — Encounter (INDEPENDENT_AMBULATORY_CARE_PROVIDER_SITE_OTHER): Payer: Self-pay | Admitting: Physician Assistant

## 2021-03-30 ENCOUNTER — Ambulatory Visit (INDEPENDENT_AMBULATORY_CARE_PROVIDER_SITE_OTHER): Payer: 59 | Admitting: Physician Assistant

## 2021-03-30 VITALS — BP 124/83 | HR 77 | Temp 98.4°F | Ht 65.0 in | Wt 292.0 lb

## 2021-03-30 DIAGNOSIS — E669 Obesity, unspecified: Secondary | ICD-10-CM

## 2021-03-30 DIAGNOSIS — E559 Vitamin D deficiency, unspecified: Secondary | ICD-10-CM

## 2021-03-30 DIAGNOSIS — Z7985 Long-term (current) use of injectable non-insulin antidiabetic drugs: Secondary | ICD-10-CM

## 2021-03-30 DIAGNOSIS — E1165 Type 2 diabetes mellitus with hyperglycemia: Secondary | ICD-10-CM | POA: Diagnosis not present

## 2021-03-30 DIAGNOSIS — Z9189 Other specified personal risk factors, not elsewhere classified: Secondary | ICD-10-CM

## 2021-03-30 DIAGNOSIS — Z6841 Body Mass Index (BMI) 40.0 and over, adult: Secondary | ICD-10-CM

## 2021-03-30 MED ORDER — VITAMIN D (ERGOCALCIFEROL) 1.25 MG (50000 UNIT) PO CAPS: 50000.0000 [IU] | ORAL_CAPSULE | ORAL | 0 refills | Status: DC

## 2021-04-01 NOTE — Progress Notes (Signed)
? ? ? ?Chief Complaint:  ? ?OBESITY ?Maria Quinn is here to discuss her progress with her obesity treatment plan along with follow-up of her obesity related diagnoses. Maria Quinn is on the Category 3 Plan and Pescatarian and keeping a food journal and adhering to recommended goals of 1500 calories and 100 grams of protein and states she is following her eating plan approximately 70% of the time. Maria Quinn states she is exercising with [employer] 10-45 minutes 7 times per week. ? ?Today's visit was #: 14 ?Starting weight: 328 lbs ?Starting date: 04/13/2020 ?Today's weight: 292 lbs ?Today's date: 03/30/2021 ?Total lbs lost to date: 36 lbs ?Total lbs lost since last in-office visit: 15 lbs ? ?Interim History: Maria Quinn was discouraged after last visit, went home and decided to work out daily. She is eating protein and plants at every meal. She is not journaling.  ? ?Subjective:  ? ?1. Type 2 diabetes mellitus with hyperglycemia, without long-term current use of insulin (HCC) ?Maria Quinn still endorses cravings on 1 mg Ozempic. She denies excessive hunger.  ? ?2. Vitamin D deficiency ?Maria Quinn is on Vitamin D 1,000 units over the counter. She is not taking weekly 50,000 units.  ? ?3. At risk for osteoporosis ?Maria Quinn is at higher risk of osteopenia and osteoporosis due to Vitamin D deficiency.   ? ?Assessment/Plan:  ? ?1. Type 2 diabetes mellitus with hyperglycemia, without long-term current use of insulin (HCC) ?Maria Quinn will continue Ozempic 1 mg. Good blood sugar control is important to decrease the likelihood of diabetic complications such as nephropathy, neuropathy, limb loss, blindness, coronary artery disease, and death. Intensive lifestyle modification including diet, exercise and weight loss are the first line of treatment for diabetes.  ? ?2. Vitamin D deficiency ?Low Vitamin D level contributes to fatigue and are associated with obesity, breast, and colon cancer. We will refill prescription Vitamin D 50,000 IU every week for 1 month with  no refills and Maria Quinn will follow-up for routine testing of Vitamin D, at least 2-3 times per year to avoid over-replacement. ? ?- Vitamin D, Ergocalciferol, (DRISDOL) 1.25 MG (50000 UNIT) CAPS capsule; Take 1 capsule (50,000 Units total) by mouth every 7 (seven) days.  Dispense: 4 capsule; Refill: 0 ? ?3. At risk for osteoporosis ?Maria Quinn was given approximately 15 minutes of osteoporosis prevention counseling today. Maria Quinn is at risk for osteopenia and osteoporosis due to her Vitamin D deficiency. She was encouraged to take her Vit D and follow her higher calcium diet and increase strengthening exercise to help strengthen her bones and decrease her risk of osteopenia and osteoporosis.  ? ?4. Obesity, with current BMI of 51.2 ?Maria Quinn is currently in the action stage of change. As such, her goal is to continue with weight loss efforts. She has agreed to keeping a food journal and adhering to recommended goals of 1500 calories and 100 grams of protein daily.  ? ?Exercise goals:  As is.  ? ?Behavioral modification strategies: meal planning and cooking strategies and keeping healthy foods in the home. ? ?Maria Quinn has agreed to follow-up with our clinic in 3 weeks. She was informed of the importance of frequent follow-up visits to maximize her success with intensive lifestyle modifications for her multiple health conditions.  ? ?Objective:  ? ?Blood pressure 124/83, pulse 77, temperature 98.4 ?F (36.9 ?C), height 5\' 5"  (1.651 m), weight 292 lb (132.5 kg), last menstrual period 10/19/2010, SpO2 95 %. ?Body mass index is 48.59 kg/m?. ? ?General: Cooperative, alert, well developed, in no acute distress. ?  HEENT: Conjunctivae and lids unremarkable. ?Cardiovascular: Regular rhythm.  ?Lungs: Normal work of breathing. ?Neurologic: No focal deficits.  ? ?Lab Results  ?Component Value Date  ? CREATININE 0.84 03/09/2021  ? BUN 16 03/09/2021  ? NA 146 (H) 03/09/2021  ? K 4.0 03/09/2021  ? CL 105 03/09/2021  ? CO2 26 03/09/2021  ? ?Lab  Results  ?Component Value Date  ? ALT 12 03/09/2021  ? AST 10 03/09/2021  ? ALKPHOS 50 03/09/2021  ? BILITOT 0.4 03/09/2021  ? ?Lab Results  ?Component Value Date  ? HGBA1C 6.1 (H) 03/09/2021  ? HGBA1C 6.7 (H) 09/14/2020  ? HGBA1C 7.7 (H) 04/13/2020  ? HGBA1C 6.4 01/29/2018  ? ?Lab Results  ?Component Value Date  ? INSULIN 13.0 03/09/2021  ? INSULIN 17.8 09/14/2020  ? INSULIN 24.1 04/13/2020  ? ?Lab Results  ?Component Value Date  ? TSH 1.490 04/13/2020  ? ?Lab Results  ?Component Value Date  ? CHOL 160 03/09/2021  ? HDL 61 03/09/2021  ? LDLCALC 82 03/09/2021  ? TRIG 89 03/09/2021  ? CHOLHDL 2.6 03/09/2021  ? ?Lab Results  ?Component Value Date  ? VD25OH 30.4 03/09/2021  ? VD25OH 41.9 09/14/2020  ? VD25OH 25.7 (L) 04/13/2020  ? ?Lab Results  ?Component Value Date  ? WBC 6.2 04/13/2020  ? HGB 12.5 04/13/2020  ? HCT 41.3 04/13/2020  ? MCV 76 (L) 04/13/2020  ? PLT 240 04/13/2020  ? ?Lab Results  ?Component Value Date  ? IRON 77 04/13/2020  ? TIBC 334 04/13/2020  ? FERRITIN 88 04/13/2020  ? ?Attestation Statements:  ? ?Reviewed by clinician on day of visit: allergies, medications, problem list, medical history, surgical history, family history, social history, and previous encounter notes. ? ?I, Sindy Messing, am acting as Energy manager for [employer], PA-C. ? ?I have reviewed the above documentation for accuracy and completeness, and I agree with the above. Alois Cliche, PA-C ? ?

## 2021-04-14 ENCOUNTER — Other Ambulatory Visit (INDEPENDENT_AMBULATORY_CARE_PROVIDER_SITE_OTHER): Payer: Self-pay | Admitting: Physician Assistant

## 2021-04-14 DIAGNOSIS — E1165 Type 2 diabetes mellitus with hyperglycemia: Secondary | ICD-10-CM

## 2021-04-14 NOTE — Telephone Encounter (Signed)
Maria Quinn 

## 2021-04-18 ENCOUNTER — Encounter (INDEPENDENT_AMBULATORY_CARE_PROVIDER_SITE_OTHER): Payer: Self-pay | Admitting: Physician Assistant

## 2021-04-20 ENCOUNTER — Ambulatory Visit (INDEPENDENT_AMBULATORY_CARE_PROVIDER_SITE_OTHER): Payer: 59 | Admitting: Physician Assistant

## 2021-04-20 ENCOUNTER — Encounter (INDEPENDENT_AMBULATORY_CARE_PROVIDER_SITE_OTHER): Payer: Self-pay | Admitting: Physician Assistant

## 2021-04-20 VITALS — BP 100/67 | HR 70 | Temp 98.2°F | Ht 65.0 in | Wt 292.0 lb

## 2021-04-20 DIAGNOSIS — Z6841 Body Mass Index (BMI) 40.0 and over, adult: Secondary | ICD-10-CM | POA: Diagnosis not present

## 2021-04-20 DIAGNOSIS — E669 Obesity, unspecified: Secondary | ICD-10-CM

## 2021-04-20 DIAGNOSIS — E1165 Type 2 diabetes mellitus with hyperglycemia: Secondary | ICD-10-CM

## 2021-04-20 DIAGNOSIS — Z9189 Other specified personal risk factors, not elsewhere classified: Secondary | ICD-10-CM

## 2021-04-20 DIAGNOSIS — E559 Vitamin D deficiency, unspecified: Secondary | ICD-10-CM | POA: Diagnosis not present

## 2021-04-20 MED ORDER — SEMAGLUTIDE (1 MG/DOSE) 4 MG/3ML ~~LOC~~ SOPN: 1.0000 mg | PEN_INJECTOR | SUBCUTANEOUS | 0 refills | Status: DC

## 2021-04-20 MED ORDER — VITAMIN D (ERGOCALCIFEROL) 1.25 MG (50000 UNIT) PO CAPS: 50000.0000 [IU] | ORAL_CAPSULE | ORAL | 0 refills | Status: DC

## 2021-04-26 NOTE — Progress Notes (Signed)
? ? ? ?Chief Complaint:  ? ?OBESITY ?Maria Quinn is here to discuss her progress with her obesity treatment plan along with follow-up of her obesity related diagnoses. [name] is on keeping a food journal and adhering to recommended goals of 1500 calories and 100 grams of protein and states she is following her eating plan approximately 75% of the time. Maria Quinn states she is doing Hospital doctor 15-30 minutes 3-4 times per week. ? ?Today's visit was #: 15 ?Starting weight: 328 lbs ?Starting date: 04/13/2020 ?Today's weight: 292 lbs ?Today's date: 04/20/2021 ?Total lbs lost to date: 36 lbs ?Total lbs lost since last in-office visit: 0 ? ?Interim History: [name] did well with maintenance. She is getting vegetables and protein 3 meals a day and she is not snacking. She isn't journaling. She is doing [employer] double shot espressos.  ? ?Subjective:  ? ?1. Type 2 diabetes mellitus with hyperglycemia, without long-term current use of insulin (HCC) ?Maria Quinn is currently on Ozempic 1 mg weekly. Her last A1C was 6.1. She denies nausea, vomiting or diarrhea. She notes decreased appetite.  ? ?2. Vitamin D deficiency ?Maria Quinn's last Vitamin D was 30.4, therefore Vitamin D 50,000 units weekly was continued.  ? ?3. At risk for impaired metabolic function ?Comfort is at risk for impaired metabolic function due to not eating enough.  ? ?Assessment/Plan:  ? ?1. Type 2 diabetes mellitus with hyperglycemia, without long-term current use of insulin (HCC) ?We will refill Ozempic for 1 month with no refills. Good blood sugar control is important to decrease the likelihood of diabetic complications such as nephropathy, neuropathy, limb loss, blindness, coronary artery disease, and death. Intensive lifestyle modification including diet, exercise and weight loss are the first line of treatment for diabetes.  ? ?- Semaglutide, 1 MG/DOSE, 4 MG/3ML SOPN; Inject 1 mg as directed once a week.  Dispense: 3 mL; Refill: 0 ? ?2. Vitamin D deficiency ?Low Vitamin D  level contributes to fatigue and are associated with obesity, breast, and colon cancer. We will refill prescription Vitamin D 50,000 IU every week for 1 month with no refills and Barney will follow-up for routine testing of Vitamin D, at least 2-3 times per year to avoid over-replacement. ? ?- Vitamin D, Ergocalciferol, (DRISDOL) 1.25 MG (50000 UNIT) CAPS capsule; Take 1 capsule (50,000 Units total) by mouth every 7 (seven) days.  Dispense: 4 capsule; Refill: 0 ? ?3. At risk for impaired metabolic function ?Maria Quinn was given approximately 15 minutes of impaired  metabolic function prevention counseling today. We discussed intensive lifestyle modifications today with an emphasis on specific nutrition and exercise instructions and strategies.  ? ?Repetitive spaced learning was employed today to elicit superior memory formation and behavioral change.  ? ?4. Obesity, with current BMI of 48.59 ?[name] is currently in the action stage of change. As such, her goal is to continue with weight loss efforts. She has agreed to keeping a food journal and adhering to recommended goals of 1500 calories and 100 grams of protein daily.  ? ?Exercise goals:  As is.  ? ?Behavioral modification strategies: meal planning and cooking strategies, better snacking choices, planning for success, and keeping a strict food journal. ? ?Maria Quinn has agreed to follow-up with our clinic in 4 weeks. She was informed of the importance of frequent follow-up visits to maximize her success with intensive lifestyle modifications for her multiple health conditions.  ? ?Objective:  ? ?Blood pressure 100/67, pulse 70, temperature 98.2 ?F (36.8 ?C), height 5\' 5"  (1.651 m), weight 292 lb (  132.5 kg), last menstrual period 10/19/2010, SpO2 98 %. ?Body mass index is 48.59 kg/m?. ? ?General: Cooperative, alert, well developed, in no acute distress. ?HEENT: Conjunctivae and lids unremarkable. ?Cardiovascular: Regular rhythm.  ?Lungs: Normal work of  breathing. ?Neurologic: No focal deficits.  ? ?Lab Results  ?Component Value Date  ? CREATININE 0.84 03/09/2021  ? BUN 16 03/09/2021  ? NA 146 (H) 03/09/2021  ? K 4.0 03/09/2021  ? CL 105 03/09/2021  ? CO2 26 03/09/2021  ? ?Lab Results  ?Component Value Date  ? ALT 12 03/09/2021  ? AST 10 03/09/2021  ? ALKPHOS 50 03/09/2021  ? BILITOT 0.4 03/09/2021  ? ?Lab Results  ?Component Value Date  ? HGBA1C 6.1 (H) 03/09/2021  ? HGBA1C 6.7 (H) 09/14/2020  ? HGBA1C 7.7 (H) 04/13/2020  ? HGBA1C 6.4 01/29/2018  ? ?Lab Results  ?Component Value Date  ? INSULIN 13.0 03/09/2021  ? INSULIN 17.8 09/14/2020  ? INSULIN 24.1 04/13/2020  ? ?Lab Results  ?Component Value Date  ? TSH 1.490 04/13/2020  ? ?Lab Results  ?Component Value Date  ? CHOL 160 03/09/2021  ? HDL 61 03/09/2021  ? LDLCALC 82 03/09/2021  ? TRIG 89 03/09/2021  ? CHOLHDL 2.6 03/09/2021  ? ?Lab Results  ?Component Value Date  ? VD25OH 30.4 03/09/2021  ? VD25OH 41.9 09/14/2020  ? VD25OH 25.7 (L) 04/13/2020  ? ?Lab Results  ?Component Value Date  ? WBC 6.2 04/13/2020  ? HGB 12.5 04/13/2020  ? HCT 41.3 04/13/2020  ? MCV 76 (L) 04/13/2020  ? PLT 240 04/13/2020  ? ?Lab Results  ?Component Value Date  ? IRON 77 04/13/2020  ? TIBC 334 04/13/2020  ? FERRITIN 88 04/13/2020  ? ?Attestation Statements:  ? ?Reviewed by clinician on day of visit: allergies, medications, problem list, medical history, surgical history, family history, social history, and previous encounter notes. ? ?I, Sindy Messing, am acting as Energy manager for [employer], PA-C. ? ?I have reviewed the above documentation for accuracy and completeness, and I agree with the above. Alois Cliche, PA-C ? ?

## 2021-05-12 ENCOUNTER — Ambulatory Visit: Payer: Self-pay | Admitting: Internal Medicine

## 2021-05-19 ENCOUNTER — Ambulatory Visit (INDEPENDENT_AMBULATORY_CARE_PROVIDER_SITE_OTHER): Payer: 59 | Admitting: Physician Assistant

## 2021-05-30 ENCOUNTER — Ambulatory Visit (INDEPENDENT_AMBULATORY_CARE_PROVIDER_SITE_OTHER): Payer: Self-pay | Admitting: Family Medicine

## 2021-05-30 ENCOUNTER — Encounter (INDEPENDENT_AMBULATORY_CARE_PROVIDER_SITE_OTHER): Payer: Self-pay | Admitting: Family Medicine

## 2021-05-30 VITALS — BP 112/78 | HR 71 | Temp 98.2°F | Ht 65.0 in | Wt 290.0 lb

## 2021-05-30 DIAGNOSIS — E1165 Type 2 diabetes mellitus with hyperglycemia: Secondary | ICD-10-CM

## 2021-05-30 DIAGNOSIS — Z7985 Long-term (current) use of injectable non-insulin antidiabetic drugs: Secondary | ICD-10-CM

## 2021-05-30 DIAGNOSIS — E669 Obesity, unspecified: Secondary | ICD-10-CM

## 2021-05-30 DIAGNOSIS — E559 Vitamin D deficiency, unspecified: Secondary | ICD-10-CM

## 2021-05-30 DIAGNOSIS — Z6841 Body Mass Index (BMI) 40.0 and over, adult: Secondary | ICD-10-CM

## 2021-05-30 MED ORDER — VITAMIN D (ERGOCALCIFEROL) 1.25 MG (50000 UNIT) PO CAPS: 50000.0000 [IU] | ORAL_CAPSULE | ORAL | 0 refills | Status: DC

## 2021-05-30 MED ORDER — SEMAGLUTIDE (1 MG/DOSE) 4 MG/3ML ~~LOC~~ SOPN: 1.0000 mg | PEN_INJECTOR | SUBCUTANEOUS | 0 refills | Status: DC

## 2021-05-31 ENCOUNTER — Ambulatory Visit: Payer: 59 | Admitting: Internal Medicine

## 2021-05-31 ENCOUNTER — Encounter (INDEPENDENT_AMBULATORY_CARE_PROVIDER_SITE_OTHER): Payer: Self-pay | Admitting: Family Medicine

## 2021-05-31 ENCOUNTER — Encounter: Payer: Self-pay | Admitting: Internal Medicine

## 2021-05-31 ENCOUNTER — Encounter (INDEPENDENT_AMBULATORY_CARE_PROVIDER_SITE_OTHER): Payer: Self-pay

## 2021-05-31 VITALS — BP 120/88 | HR 72 | Temp 97.8°F | Resp 16 | Ht 65.5 in | Wt 292.1 lb

## 2021-05-31 DIAGNOSIS — Z0000 Encounter for general adult medical examination without abnormal findings: Secondary | ICD-10-CM

## 2021-05-31 DIAGNOSIS — I10 Essential (primary) hypertension: Secondary | ICD-10-CM

## 2021-05-31 DIAGNOSIS — E1165 Type 2 diabetes mellitus with hyperglycemia: Secondary | ICD-10-CM | POA: Diagnosis not present

## 2021-05-31 DIAGNOSIS — Z1231 Encounter for screening mammogram for malignant neoplasm of breast: Secondary | ICD-10-CM

## 2021-05-31 MED ORDER — AMLODIPINE BESYLATE 5 MG PO TABS: 5.0000 mg | ORAL_TABLET | Freq: Every day | ORAL | 3 refills | Status: DC

## 2021-05-31 MED ORDER — SPIRONOLACTONE 50 MG PO TABS: 50.0000 mg | ORAL_TABLET | Freq: Every day | ORAL | 3 refills | Status: DC

## 2021-05-31 MED ORDER — CHLORTHALIDONE 25 MG PO TABS: 25.0000 mg | ORAL_TABLET | Freq: Every day | ORAL | 3 refills | Status: DC

## 2021-05-31 MED ORDER — METFORMIN HCL 500 MG PO TABS: 500.0000 mg | ORAL_TABLET | Freq: Every day | ORAL | 3 refills | Status: DC

## 2021-05-31 MED ORDER — AMLODIPINE BESYLATE 10 MG PO TABS: 10.0000 mg | ORAL_TABLET | Freq: Every day | ORAL | 3 refills | Status: DC

## 2021-05-31 MED ORDER — LOSARTAN POTASSIUM 50 MG PO TABS: 50.0000 mg | ORAL_TABLET | Freq: Every day | ORAL | 3 refills | Status: DC

## 2021-05-31 NOTE — Progress Notes (Signed)
Chief Complaint  Patient presents with   Follow-up   Diabetes   Hypertension   Annual Exam   Annual  1. Htn/dm 2 s/p wt loss surgery years ago controlled no meds today on norvasc 10, chlorthalidone 25, losartan 50 and spironolactone 50 and metformin 500 , ozempic 1 mg weekly  Review of Systems  Constitutional:  Negative for weight loss.  HENT:  Negative for hearing loss.   Eyes:  Negative for blurred vision.  Respiratory:  Negative for shortness of breath.   Cardiovascular:  Negative for chest pain.  Gastrointestinal:  Negative for abdominal pain and blood in stool.  Genitourinary:  Negative for dysuria.  Musculoskeletal:  Negative for falls and joint pain.  Skin:  Negative for rash.  Neurological:  Negative for headaches.  Psychiatric/Behavioral:  Negative for depression.   Past Medical History:  Diagnosis Date   Anemia    Anxiety    Arthritis    Back pain    Chicken pox    Depression    Exocrine pancreatic insufficiency    Gastric bypass for obesity complicating pregnancy, birth, puerperium    Hyperlipidemia    Hypertension    Hypertension associated with diabetes ([place])    OSA (obstructive sleep apnea)    Other fatigue    Pre-diabetes    Rheumatoid arthritis (HCC)    Sleep apnea    SOB (shortness of breath) on exertion    Swelling of both lower extremities    Vitamin D deficiency    Past Surgical History:  Procedure Laterality Date   ABDOMINAL HYSTERECTOMY     Dr. Daiva Huge 2012    COLONOSCOPY WITH PROPOFOL N/A 01/01/2019   Procedure: COLONOSCOPY WITH PROPOFOL;  Surgeon: Virgel Manifold, MD;  Location: [facility];  Service: Endoscopy;  Laterality: N/A;   ESOPHAGOGASTRODUODENOSCOPY (EGD) WITH PROPOFOL N/A 01/01/2019   Procedure: ESOPHAGOGASTRODUODENOSCOPY (EGD) WITH PROPOFOL;  Surgeon: Virgel Manifold, MD;  Location: [facility] ENDOSCOPY;  Service: Endoscopy;  Laterality: N/A;   OOPHORECTOMY     41 y.o 1 ovary out per pt, later in life 2nd ovary out     ROUX-EN-Y GASTRIC BYPASS     2010 losst down to 180s gained back    TONSILLECTOMY     1987   Family History  Problem Relation Age of Onset   Depression Mother    Diabetes Mother    Hypertension Mother    Miscarriages / [place] Mother    Cancer Father        [email or web address]   Diabetes Father    Heart disease Father    Hyperlipidemia Father    Hypertension Father    Kidney disease Father    Stroke Father    Depression Sister    Alcohol abuse Brother    Cancer Brother        lung    Drug abuse Brother    Arthritis Maternal Grandmother    Diabetes Maternal Grandmother    Hypertension Maternal Grandmother    Stroke Maternal Grandmother    Alcohol abuse Maternal Grandfather    Stroke Paternal Grandmother    Cancer Paternal Grandfather        ? type   Alcohol abuse Brother    Early death Brother    Social History   Socioeconomic History   Marital status: Single    Spouse name: Not on file   Number of children: Not on file   Years of education: Not on file   Highest education level: Not on file  Occupational History   Occupation: Scientist, product/process development  Tobacco Use   Smoking status: Never   Smokeless tobacco: Never  Vaping Use   Vaping Use: Never used  Substance and Sexual Activity   Alcohol use: Yes    Alcohol/week: 2.0 - 3.0 standard drinks    Types: 2 - 3 Glasses of wine per week   Drug use: No   Sexual activity: Yes    Comment: men  Other Topics Concern   Not on file  Social History Narrative   Some college   Entertainer    Single    No kids    Wears seat belt, safe in relationship    Will start Job 09/16/2018 [employer] in [place] 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   blood glucose meter kit and supplies Dispense based on patient and insurance preference. Use up to two times daily as directed. (FOR ICD-10 E10.9, E11.9). 1 year supplies   Cholecalciferol (VITAMIN D) 50 MCG (2000 UT) CAPS Take by mouth daily.   Multiple Vitamins-Minerals (CENTRUM WOMEN PO) Take by mouth.   Semaglutide, 1 MG/DOSE, 4 MG/3ML SOPN Inject 1 mg as directed once a week.   Vitamin D, Ergocalciferol, (DRISDOL) 1.25 MG (50000 UNIT) CAPS capsule Take 1 capsule (50,000 Units total) by mouth every 7 (seven) days.   [DISCONTINUED] amLODipine (NORVASC) 10 MG tablet Take 1 tablet (10 mg total) by mouth daily.   [DISCONTINUED] chlorthalidone (HYGROTON) 25 MG tablet Take 1 tablet by mouth once daily   [DISCONTINUED] losartan (COZAAR) 50 MG tablet Take 1 tablet (50 mg total) by mouth daily. At night   [DISCONTINUED] metFORMIN (GLUCOPHAGE) 500 MG tablet Take 1 tablet (500 mg total) by mouth daily with breakfast.   [DISCONTINUED] spironolactone (ALDACTONE) 50 MG tablet Take 1 tablet by mouth once daily   Allergies  Allergen Reactions   Penicillins Rash   Recent Results (from the past 2160 hour(s))  Comprehensive metabolic panel     Status: Abnormal   Collection Time: 03/09/21  8:02 AM  Result Value Ref Range   Glucose 89 70 - 99 mg/dL   BUN 16 6 - 24 mg/dL   Creatinine, Ser 0.84 0.57 - 1.00 mg/dL   eGFR 89 >59 mL/min/1.73   BUN/Creatinine Ratio 19 9 - 23   Sodium 146 (H) 134 - 144 mmol/L   Potassium 4.0 3.5 - 5.2 mmol/L   Chloride 105 96 - 106 mmol/L   CO2 26 20 - 29 mmol/L   Calcium 8.9 8.7 - 10.2 mg/dL   Total Protein 6.5 6.0 - 8.5 g/dL   Albumin 4.1 3.8 - 4.8 g/dL   Globulin, Total 2.4 1.5 - 4.5 g/dL   Albumin/Globulin Ratio 1.7 1.2 - 2.2   Bilirubin Total 0.4 0.0 - 1.2 mg/dL   Alkaline Phosphatase 50 44 - 121 IU/L   AST 10 0 - 40 IU/L   ALT 12 0 - 32 IU/L  Hemoglobin A1c      Status: Abnormal   Collection Time: 03/09/21  8:02 AM  Result Value  Ref Range   Hgb A1c MFr Bld 6.1 (H) 4.8 - 5.6 %    Comment:          Prediabetes: 5.7 - 6.4          Diabetes: >6.4          Glycemic control for adults with diabetes: <7.0    Est. average glucose Bld gHb Est-mCnc 128 mg/dL  Insulin, random     Status: None   Collection Time: 03/09/21  8:02 AM  Result Value Ref Range   INSULIN 13.0 2.6 - 24.9 uIU/mL  Lipid panel     Status: None   Collection Time: 03/09/21  8:02 AM  Result Value Ref Range   Cholesterol, Total 160 100 - 199 mg/dL   Triglycerides 89 0 - 149 mg/dL   HDL 61 >39 mg/dL   VLDL Cholesterol Cal 17 5 - 40 mg/dL   LDL Chol Calc (NIH) 82 0 - 99 mg/dL   Chol/HDL Ratio 2.6 0.0 - 4.4 ratio    Comment:                                   T. Chol/HDL Ratio                                             Men  Women                               1/2 Avg.Risk  3.4    3.3                                   Avg.Risk  5.0    4.4                                2X Avg.Risk  9.6    7.1                                3X Avg.Risk 23.4   11.0   VITAMIN D 25 Hydroxy (Vit-D Deficiency, Fractures)     Status: None   Collection Time: 03/09/21  8:02 AM  Result Value Ref Range   Vit D, 25-Hydroxy 30.4 30.0 - 100.0 ng/mL    Comment: Vitamin D deficiency has been defined by the [place] practice guideline as a level of serum 25-OH vitamin D less than 20 ng/mL (1,2). The Endocrine Society went on to further define vitamin D insufficiency as a level between 21 and 29 ng/mL (2). 1. IOM (Institute of Medicine). 2010. Dietary reference    intakes for calcium and D. [place]: The    [employer]. 2. Holick MF, Bink[place], Bischoff-Ferrari HA, et al.    Evaluation, treatment, and prevention of vitamin D    deficiency: an Endocrine Society clinical practice    guideline. JCEM. 2011 Jul; 96(7):1911-30.    Objective  Body mass index  is 47.87 kg/m. Wt Readings from Last 3 Encounters:  05/31/21 292 lb 2 oz (132.5 kg)  05/30/21 290 lb (  131.5 kg)  04/20/21 292 lb (132.5 kg)   Temp Readings from Last 3 Encounters:  05/31/21 97.8 F (36.6 C) (Oral)  05/30/21 98.2 F (36.8 C)  04/20/21 98.2 F (36.8 C)   BP Readings from Last 3 Encounters:  05/31/21 120/88  05/30/21 112/78  04/20/21 100/67   Pulse Readings from Last 3 Encounters:  05/31/21 72  05/30/21 71  04/20/21 70    Physical Exam Vitals and nursing note reviewed.  Constitutional:      Appearance: Normal appearance. She is well-developed and well-groomed.  HENT:     Head: Normocephalic and atraumatic.  Eyes:     Conjunctiva/sclera: Conjunctivae normal.     Pupils: Pupils are equal, round, and reactive to light.  Cardiovascular:     Rate and Rhythm: Normal rate and regular rhythm.     Heart sounds: Normal heart sounds. No murmur heard. Pulmonary:     Effort: Pulmonary effort is normal.     Breath sounds: Normal breath sounds.  Abdominal:     General: Abdomen is flat. Bowel sounds are normal.     Tenderness: There is no abdominal tenderness.  Musculoskeletal:        General: No tenderness.  Skin:    General: Skin is warm and dry.  Neurological:     General: No focal deficit present.     Mental Status: She is alert and oriented to person, place, and time. Mental status is at baseline.     Cranial Nerves: Cranial nerves 2-12 are intact.     Motor: Motor function is intact.     Coordination: Coordination is intact.     Gait: Gait is intact.  Psychiatric:        Attention and Perception: Attention and perception normal.        Mood and Affect: Mood and affect normal.        Speech: Speech normal.        Behavior: Behavior normal. Behavior is cooperative.        Thought Content: Thought content normal.        Cognition and Memory: Cognition and memory normal.        Judgment: Judgment normal.    Assessment  Plan  Annual physical exam See  below Essential hypertension - Plan: amLODipine (NORVASC) 5 MG tablet, spironolactone (ALDACTONE) 50 MG tablet, losartan (COZAAR) 50 MG tablet, chlorthalidone (HYGROTON) 25 MG tablet, DISCONTINUED: spironolactone (ALDACTONE) 50 MG tablet, DISCONTINUED: amLODipine (NORVASC) 10 MG tablet,  Type 2 diabetes mellitus with hyperglycemia, without long-term current use of insulin ([place]) - Plan: metFORMIN (GLUCOPHAGE) 500 MG tablet, ozempic 1 mg weekly  Screening mammogram, encounter for -   HM Declines flu shot  covid 3/3 consider boosters last 10/09/20 per pt Tdap in future at health dept Check hep B status in future currently pt w/o insurance    HIV neg 10/22/10 declines STD check   Hep C declines    LMP 2012 s/p hysterectomy both ovaries removed previously -CT ab/pelvis 10/19/10 c/w complex cystic mass in uterus with elevated CA 125 -per path report removal uterus, b/l fallopian tubes and cervix neg malignancy or dysplasia 10/2010 Dr. Daiva Huge    Former smoker  Rec reduce etoh intake    rec take Mvt with iron    mammo due 02/25/2019 [facility] negative  Ordered 2022 [facility] [place]   Egd/colonoscopy 01/01/19 diverticulosis, EGD no bxs f/u in 10 years colonos. She also has    rec healthy diet and exercise   Consider  check hep C in the future    Dr. Olivia Mackie McLean-Scocuz[facility]

## 2021-05-31 NOTE — Telephone Encounter (Signed)
Please advise 

## 2021-05-31 NOTE — Telephone Encounter (Signed)
Dr.Ukleja 

## 2021-05-31 NOTE — Patient Instructions (Signed)
Indigestion Indigestion is a feeling of pain, discomfort, burning, or fullness in the upper part of your abdomen. It can come and go. It may occur frequently or rarely. Indigestion tends to occur while you are eating or right after you have finished eating. It may be worse at night and while bending over or lying down. Indigestion may be a symptom of an underlying digestive condition. Follow these instructions at home: Eating and drinking  Follow an eating plan as recommended by your health care provider. Avoid certain foods and drinks as told by your health care provider. This may include: Chocolate and cocoa. Peppermint and mint flavorings. Garlic and onions. Horseradish. Spicy and acidic foods, including peppers, chili powder, curry powder, vinegar, hot sauces, and barbecue sauce. Citrus fruits, such as oranges, lemons, and limes. Tomato-based foods, such as red sauce, chili, salsa, and pizza with red sauce. Fried and fatty foods, such as donuts, french fries, potato chips, and high-fat dressings. High-fat meats, such as hot dogs and fatty cuts of red and white meats, such as rib eye steak, sausage, ham, and bacon. High-fat dairy items, such as whole milk, butter, and cream cheese. Coffee and tea, with or without caffeine. Drinks that contain alcohol. Energy drinks and sports drinks. Carbonated drinks or sodas. Citrus fruit juices. Eat small, frequent meals instead of large meals. Avoid drinking large amounts of liquid with your meals. Avoid eating meals during the 2-3 hours before bedtime. Avoid lying down right after you eat. Avoid exercise for 2 hours after you eat. Lifestyle     Maintain a healthy weight. Ask your health care provider what weight is healthy for you. If you need to lose weight, work with your health care provider to do so safely. Exercise for at least 30 minutes on 5 or more days each week, or as told by your health care provider. Avoid exercises that include  bending forward. This can make your symptoms worse. Wear loose-fitting clothing. Do not wear anything tight around your waist that causes pressure on your abdomen. Do not use any products that contain nicotine or tobacco. These products include cigarettes, chewing tobacco, and vaping devices, such as e-cigarettes. These can make symptoms worse. If you need help quitting, ask your health care provider. Raise (elevate) the head of your bed about 6 inches (15 cm) when you sleep. You can use a wedge to do this. Try to reduce your stress, such as with yoga or meditation. If you need help reducing stress, ask your health care provider. General instructions Take over-the-counter and prescription medicines only as told by your health care provider. Do not take aspirin or NSAIDs, such as ibuprofen, unless your health care provider told you to take them. Pay attention to any changes in your symptoms. Keep all follow-up visits. This is important. Contact a health care provider if: You have new symptoms. You have unexplained weight loss. You have difficulty swallowing, or it hurts to swallow. Your symptoms do not improve with treatment. Your symptoms last for more than 2 days. You have a fever. You vomit. Get help right away if: You suddenly have pain in your arms, neck, jaw, teeth, or back. You suddenly feel sweaty, dizzy, or light-headed. You faint. You have chest pain or shortness of breath. You cannot stop vomiting, or you vomit blood. Your stool is bloody or black. You have severe pain in your abdomen. These symptoms may represent a serious problem that is an emergency. Do not wait to see if the symptoms will  go away. Get medical help right away. Call your local emergency services (911 in the [place] Do not drive yourself to the hospital. Summary Indigestion is a feeling of pain, discomfort, burning, or fullness in the upper part of your abdomen. It tends to occur while you are eating or right  after you have finished eating. Follow an eating plan and other lifestyle changes as told by your health care provider. Take over-the-counter and prescription medicines only as told by your health care provider. Do not take aspirin or NSAIDs, such as ibuprofen, unless your health care provider told you to do so. Contact your health care provider if your symptoms do not get better or they get worse. Some symptoms may represent a serious problem that is an emergency. Do not wait to see if the symptoms will go away. Get medical help right away. This information is not intended to replace advice given to you by your health care provider. Make sure you discuss any questions you have with your health care provider. Document Revised: 07/02/2019 Document Reviewed: 07/02/2019 Elsevier Patient Education  2023 [employer].

## 2021-06-06 NOTE — Progress Notes (Signed)
Chief Complaint:   OBESITY Maria Quinn is here to discuss her progress with her obesity treatment plan along with follow-up of her obesity related diagnoses. [name] is on the [employer] and states she is following her eating plan approximately 50% of the time. [name] states she is walking and doing you tube video's 12-30 minutes 2 times per week.  Today's visit was #: 16 Starting weight: 328 lbs Starting date: 04/13/2020 Today's weight: 290 lbs Today's date: 06/06/2021 Total lbs lost to date: 38 lbs Total lbs lost since last in-office visit: 2 lbs  [name] has been seeing Alois Cliche, PA since August 2022.  Since her last appointment she has not been as focused as she was previously. 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  [name] recogizes she needs to meal prep and wants to get back on plan starting today.  Subjective:   1. Type 2 diabetes mellitus with hyperglycemia, without long-term current use of insulin (HCC) [name] is on Ozempic 1 mg.  [name] denies any significant GI side effects.  2. Vitamin D deficiency [name] is on prescription Vitamin D.  Her last vitamin d level was 30.  [name] denies any nausea, vomiting, or muscle weakness, but complains of fatigue.   Assessment/Plan:   1. Type 2 diabetes mellitus with hyperglycemia, without long-term current use of insulin (HCC) [name] has agreed to continue Ozempic 1 mg SQ every week 3mL, no refills, see below.  - Semaglutide, 1 MG/DOSE, 4 MG/3ML SOPN; Inject 1 mg as directed once a week.  Dispense: 3 mL; Refill: 0  2. Vitamin D deficiency [name] has agreed to continue prescription Vitamin D.  See below.   - Vitamin D, Ergocalciferol, (DRISDOL) 1.25 MG (50000 UNIT) CAPS capsule; Take 1 capsule (50,000 Units total) by mouth every 7 (seven) days.  Dispense: 4 capsule; Refill: 0  3. Obesity, with current BMI of 48.3 [name] is currently in the action stage of change. As such, her goal is to  continue with weight loss efforts. She has agreed to keeping a food journal and adhering to recommended goals of 1350-1500 calories and 100+ protein daily.   Exercise goals: All adults should avoid inactivity. Some physical activity is better than none, and adults who participate in any amount of physical activity gain some health benefits.  Behavioral modification strategies: increasing lean protein intake, meal planning and cooking strategies, planning for success, and keeping a strict food journal.  [name] has agreed to follow-up with our clinic in 3 weeks. She was informed of the importance of frequent follow-up visits to maximize her success with intensive lifestyle modifications for her multiple health conditions.   Objective:   Blood pressure 112/78, pulse 71, temperature 98.2 F (36.8 C), height 5\' 5"  (1.651 m), weight 290 lb (131.5 kg), last menstrual period 10/19/2010, SpO2 97 %. Body mass index is 48.26 kg/m.  General: Cooperative, alert, well developed, in no acute distress. HEENT: Conjunctivae and lids unremarkable. Cardiovascular: Regular rhythm.  Lungs: Normal work of breathing. Neurologic: No focal deficits.   Lab Results  Component Value Date   CREATININE 0.84 03/09/2021   BUN 16 03/09/2021   NA 146 (H) 03/09/2021   K 4.0 03/09/2021   CL 105 03/09/2021   CO2 26 03/09/2021   Lab Results  Component Value Date   ALT 12 03/09/2021   AST 10 03/09/2021   ALKPHOS 50 03/09/2021   BILITOT 0.4 03/09/2021   Lab Results  Component Value  Date   HGBA1C 6.1 (H) 03/09/2021   HGBA1C 6.7 (H) 09/14/2020   HGBA1C 7.7 (H) 04/13/2020   HGBA1C 6.4 01/29/2018   Lab Results  Component Value Date   INSULIN 13.0 03/09/2021   INSULIN 17.8 09/14/2020   INSULIN 24.1 04/13/2020   Lab Results  Component Value Date   TSH 1.490 04/13/2020   Lab Results  Component Value Date   CHOL 160 03/09/2021   HDL 61 03/09/2021   LDLCALC 82 03/09/2021   TRIG 89 03/09/2021   CHOLHDL 2.6  03/09/2021   Lab Results  Component Value Date   VD25OH 30.4 03/09/2021   VD25OH 41.9 09/14/2020   VD25OH 25.7 (L) 04/13/2020   Lab Results  Component Value Date   WBC 6.2 04/13/2020   HGB 12.5 04/13/2020   HCT 41.3 04/13/2020   MCV 76 (L) 04/13/2020   PLT 240 04/13/2020   Lab Results  Component Value Date   IRON 77 04/13/2020   TIBC 334 04/13/2020   FERRITIN 88 04/13/2020   Attestation Statements:   Reviewed by clinician on day of visit: allergies, medications, problem list, medical history, surgical history, family history, social history, and previous encounter notes.  I, Malcolm Metro, RMA, am acting as transcriptionist for Reuben Likes, MD.  I have reviewed the above documentation for accuracy and completeness, and I agree with the above. - Reuben Likes, MD

## 2021-06-20 ENCOUNTER — Ambulatory Visit (INDEPENDENT_AMBULATORY_CARE_PROVIDER_SITE_OTHER): Payer: 59 | Admitting: Family Medicine

## 2021-06-20 ENCOUNTER — Encounter (INDEPENDENT_AMBULATORY_CARE_PROVIDER_SITE_OTHER): Payer: Self-pay | Admitting: Family Medicine

## 2021-06-20 VITALS — BP 105/70 | HR 79 | Temp 98.2°F | Ht 65.0 in | Wt 292.0 lb

## 2021-06-20 DIAGNOSIS — Z7984 Long term (current) use of oral hypoglycemic drugs: Secondary | ICD-10-CM

## 2021-06-20 DIAGNOSIS — E1165 Type 2 diabetes mellitus with hyperglycemia: Secondary | ICD-10-CM | POA: Diagnosis not present

## 2021-06-20 DIAGNOSIS — Z7985 Long-term (current) use of injectable non-insulin antidiabetic drugs: Secondary | ICD-10-CM

## 2021-06-20 DIAGNOSIS — I152 Hypertension secondary to endocrine disorders: Secondary | ICD-10-CM | POA: Diagnosis not present

## 2021-06-20 DIAGNOSIS — E1159 Type 2 diabetes mellitus with other circulatory complications: Secondary | ICD-10-CM | POA: Diagnosis not present

## 2021-06-20 DIAGNOSIS — E669 Obesity, unspecified: Secondary | ICD-10-CM

## 2021-06-20 DIAGNOSIS — Z6841 Body Mass Index (BMI) 40.0 and over, adult: Secondary | ICD-10-CM

## 2021-06-20 MED ORDER — SEMAGLUTIDE (1 MG/DOSE) 4 MG/3ML ~~LOC~~ SOPN: 1.0000 mg | PEN_INJECTOR | SUBCUTANEOUS | 0 refills | Status: DC

## 2021-06-22 NOTE — Progress Notes (Unsigned)
Chief Complaint:   OBESITY Maria Quinn is here to discuss her progress with her obesity treatment plan along with follow-up of her obesity related diagnoses. Maria Quinn is on keeping a food journal and adhering to recommended goals of 1350-1500 calories and 100+ grams of protein and the Pescatarian Plan and states she is following her eating plan approximately 50% of the time. Maria Quinn states she is exercising (you tube) 10-30 minutes 4 times per week.  Today's visit was #: 17 Starting weight: 328 lbs Starting date: 04/13/2020 Today's weight: 292 lbs Today's date: 06/20/2021 Total lbs lost to date: 36 lbs Total lbs lost since last in-office visit: 0  Interim History: Maria Quinn has not been eating much over the last few weeks. She realizes she has not been as prepared as she was previously. The next few weeks she has performances scheduled.   Subjective:   1. Type 2 diabetes mellitus with hyperglycemia, without long-term current use of insulin (HCC) Maria Quinn is currently taking a Glucophage, Ozempic. Her last A1c at 6.1. Denies any GI side effects.  2. Hypertension associated with diabetes (HCC) Maria Quinn is currently taking Chlorthalidone, Amlodipine, Spironolactone and Cozaar. Her blood pressure is very well controlled today. Denies dizziness/lightheadedness.  Assessment/Plan:   1. Type 2 diabetes mellitus with hyperglycemia, without long-term current use of insulin (HCC) We will refill Ozempic 1 mg SubQ once weekly for 1 month with 0 refills.  -Refill Semaglutide, 1 MG/DOSE, 4 MG/3ML SOPN; Inject 1 mg as directed once a week.  Dispense: 3 mL; Refill: 0  2. Hypertension associated with diabetes (HCC) If blood pressure looks this controlled at next appointment will stop Spironolactone.  3. Obesity, with current BMI of 48.7 Maria Quinn is currently in the action stage of change. As such, her goal is to continue with weight loss efforts. She has agreed to keeping a food journal and adhering to recommended  goals of 1350-1500 calories and 100 + grams of protein and the Pescatarian Plan daily.  Exercise goals: As is.  Behavioral modification strategies: increasing lean protein intake, meal planning and cooking strategies, keeping healthy foods in the home, and planning for success.  Maria Quinn has agreed to follow-up with our clinic in 4 weeks. She was informed of the importance of frequent follow-up visits to maximize her success with intensive lifestyle modifications for her multiple health conditions.   Objective:   Blood pressure 105/70, pulse 79, temperature 98.2 F (36.8 C), height 5\' 5"  (1.651 m), weight 292 lb (132.5 kg), last menstrual period 10/19/2010, SpO2 96 %. Body mass index is 48.59 kg/m.  General: Cooperative, alert, well developed, in no acute distress. HEENT: Conjunctivae and lids unremarkable. Cardiovascular: Regular rhythm.  Lungs: Normal work of breathing. Neurologic: No focal deficits.   Lab Results  Component Value Date   CREATININE 0.84 03/09/2021   BUN 16 03/09/2021   NA 146 (H) 03/09/2021   K 4.0 03/09/2021   CL 105 03/09/2021   CO2 26 03/09/2021   Lab Results  Component Value Date   ALT 12 03/09/2021   AST 10 03/09/2021   ALKPHOS 50 03/09/2021   BILITOT 0.4 03/09/2021   Lab Results  Component Value Date   HGBA1C 6.1 (H) 03/09/2021   HGBA1C 6.7 (H) 09/14/2020   HGBA1C 7.7 (H) 04/13/2020   HGBA1C 6.4 01/29/2018   Lab Results  Component Value Date   INSULIN 13.0 03/09/2021   INSULIN 17.8 09/14/2020   INSULIN 24.1 04/13/2020   Lab Results  Component Value Date   TSH  1.490 04/13/2020   Lab Results  Component Value Date   CHOL 160 03/09/2021   HDL 61 03/09/2021   LDLCALC 82 03/09/2021   TRIG 89 03/09/2021   CHOLHDL 2.6 03/09/2021   Lab Results  Component Value Date   VD25OH 30.4 03/09/2021   VD25OH 41.9 09/14/2020   VD25OH 25.7 (L) 04/13/2020   Lab Results  Component Value Date   WBC 6.2 04/13/2020   HGB 12.5 04/13/2020   HCT 41.3  04/13/2020   MCV 76 (L) 04/13/2020   PLT 240 04/13/2020   Lab Results  Component Value Date   IRON 77 04/13/2020   TIBC 334 04/13/2020   FERRITIN 88 04/13/2020   Attestation Statements:   Reviewed by clinician on day of visit: allergies, medications, problem list, medical history, surgical history, family history, social history, and previous encounter notes.  I, Fortino Sic, RMA am acting as transcriptionist for Reuben Likes, MD.  I have reviewed the above documentation for accuracy and completeness, and I agree with the above. -  ***

## 2021-07-25 ENCOUNTER — Ambulatory Visit (INDEPENDENT_AMBULATORY_CARE_PROVIDER_SITE_OTHER): Payer: 59 | Admitting: Family Medicine

## 2021-07-25 ENCOUNTER — Encounter (INDEPENDENT_AMBULATORY_CARE_PROVIDER_SITE_OTHER): Payer: Self-pay | Admitting: Family Medicine

## 2021-07-25 VITALS — BP 106/75 | HR 77 | Temp 98.2°F | Ht 65.0 in | Wt 294.0 lb

## 2021-07-25 DIAGNOSIS — E669 Obesity, unspecified: Secondary | ICD-10-CM

## 2021-07-25 DIAGNOSIS — E559 Vitamin D deficiency, unspecified: Secondary | ICD-10-CM | POA: Diagnosis not present

## 2021-07-25 DIAGNOSIS — E1165 Type 2 diabetes mellitus with hyperglycemia: Secondary | ICD-10-CM

## 2021-07-25 DIAGNOSIS — Z6841 Body Mass Index (BMI) 40.0 and over, adult: Secondary | ICD-10-CM

## 2021-07-25 DIAGNOSIS — Z7985 Long-term (current) use of injectable non-insulin antidiabetic drugs: Secondary | ICD-10-CM

## 2021-07-25 MED ORDER — SEMAGLUTIDE (1 MG/DOSE) 4 MG/3ML ~~LOC~~ SOPN: 1.0000 mg | PEN_INJECTOR | SUBCUTANEOUS | 0 refills | Status: DC

## 2021-07-25 MED ORDER — VITAMIN D (ERGOCALCIFEROL) 1.25 MG (50000 UNIT) PO CAPS: 50000.0000 [IU] | ORAL_CAPSULE | ORAL | 0 refills | Status: DC

## 2021-07-27 NOTE — Progress Notes (Signed)
Chief Complaint:   OBESITY Maria Quinn is here to discuss her progress with her obesity treatment plan along with follow-up of her obesity related diagnoses. Maria Quinn is on keeping a food journal and adhering to recommended goals of 1350-1500 calories and 100+ grams of protein and states she is following her eating plan approximately 50% of the time. Maria Quinn states she is walking 30 minutes 2 times per week.  Today's visit was #: 18 Starting weight: 328 lbs Starting date: 04/13/2020 Today's weight: 294 lbs Today's date: 07/25/2021 Total lbs lost to date: 34 lbs Total lbs lost since last in-office visit: 0  Interim History: Maria Quinn has been experiencing really significant headaches--this has really limited the amount of activity she was able to do. She recognized headaches were due to elevated blood pressure. She was doing some emotional eating due to headaches. She restarted activity now that she is feeling better. No upcoming plans.  Subjective:   1. Vitamin D deficiency Maria Quinn is currently taking prescription Vit D 50,000 IU once a week. Denies any nausea, vomiting or muscle weakness. She notes fatigue.  2. Type 2 diabetes mellitus with hyperglycemia, without long-term current use of insulin (HCC) Maria Quinn is currently on Ozempic 1 mg and Metformin daily. Denies GI side effects.  Assessment/Plan:   1. Vitamin D deficiency We will refill Vit D 50,000 IU once a week for 1 month with 0 refills.  -Refill Vitamin D, Ergocalciferol, (DRISDOL) 1.25 MG (50000 UNIT) CAPS capsule; Take 1 capsule (50,000 Units total) by mouth every 7 (seven) days.  Dispense: 4 capsule; Refill: 0  2. Type 2 diabetes mellitus with hyperglycemia, without long-term current use of insulin (HCC) We will refill Ozempic 1 mg SubQ once a week for 1 month with 0 refills.  -Refill Semaglutide, 1 MG/DOSE, 4 MG/3ML SOPN; Inject 1 mg as directed once a week.  Dispense: 3 mL; Refill: 0  3. Obesity, with current BMI of 49.0 Maria Quinn is  currently in the action stage of change. As such, her goal is to continue with weight loss efforts. She has agreed to keeping a food journal and adhering to recommended goals of 1350-1500 calories and 100+ grams of protein daily.   Exercise goals: As is.  Behavioral modification strategies: increasing lean protein intake, no skipping meals, meal planning and cooking strategies, and keeping healthy foods in the home.  Maria Quinn has agreed to follow-up with our clinic in 4 weeks. She was informed of the importance of frequent follow-up visits to maximize her success with intensive lifestyle modifications for her multiple health conditions.   Objective:   Blood pressure 106/75, pulse 77, temperature 98.2 F (36.8 C), height 5\' 5"  (1.651 m), weight 294 lb (133.4 kg), last menstrual period 10/19/2010, SpO2 96 %. Body mass index is 48.92 kg/m.  General: Cooperative, alert, well developed, in no acute distress. HEENT: Conjunctivae and lids unremarkable. Cardiovascular: Regular rhythm.  Lungs: Normal work of breathing. Neurologic: No focal deficits.   Lab Results  Component Value Date   CREATININE 0.84 03/09/2021   BUN 16 03/09/2021   NA 146 (H) 03/09/2021   K 4.0 03/09/2021   CL 105 03/09/2021   CO2 26 03/09/2021   Lab Results  Component Value Date   ALT 12 03/09/2021   AST 10 03/09/2021   ALKPHOS 50 03/09/2021   BILITOT 0.4 03/09/2021   Lab Results  Component Value Date   HGBA1C 6.1 (H) 03/09/2021   HGBA1C 6.7 (H) 09/14/2020   HGBA1C 7.7 (H) 04/13/2020  HGBA1C 6.4 01/29/2018   Lab Results  Component Value Date   INSULIN 13.0 03/09/2021   INSULIN 17.8 09/14/2020   INSULIN 24.1 04/13/2020   Lab Results  Component Value Date   TSH 1.490 04/13/2020   Lab Results  Component Value Date   CHOL 160 03/09/2021   HDL 61 03/09/2021   LDLCALC 82 03/09/2021   TRIG 89 03/09/2021   CHOLHDL 2.6 03/09/2021   Lab Results  Component Value Date   VD25OH 30.4 03/09/2021   VD25OH  41.9 09/14/2020   VD25OH 25.7 (L) 04/13/2020   Lab Results  Component Value Date   WBC 6.2 04/13/2020   HGB 12.5 04/13/2020   HCT 41.3 04/13/2020   MCV 76 (L) 04/13/2020   PLT 240 04/13/2020   Lab Results  Component Value Date   IRON 77 04/13/2020   TIBC 334 04/13/2020   FERRITIN 88 04/13/2020    Attestation Statements:   Reviewed by clinician on day of visit: allergies, medications, problem list, medical history, surgical history, family history, social history, and previous encounter notes.  I, Fortino Sic, RMA am acting as transcriptionist for Reuben Likes, MD.  I have reviewed the above documentation for accuracy and completeness, and I agree with the above. - Reuben Likes, MD

## 2021-08-17 ENCOUNTER — Encounter (INDEPENDENT_AMBULATORY_CARE_PROVIDER_SITE_OTHER): Payer: Self-pay

## 2021-08-22 ENCOUNTER — Ambulatory Visit (INDEPENDENT_AMBULATORY_CARE_PROVIDER_SITE_OTHER): Payer: 59 | Admitting: Family Medicine

## 2021-08-22 ENCOUNTER — Ambulatory Visit (INDEPENDENT_AMBULATORY_CARE_PROVIDER_SITE_OTHER): Payer: Self-pay | Admitting: Family Medicine

## 2021-08-22 ENCOUNTER — Encounter (INDEPENDENT_AMBULATORY_CARE_PROVIDER_SITE_OTHER): Payer: Self-pay | Admitting: Family Medicine

## 2021-08-22 VITALS — BP 107/73 | HR 75 | Temp 98.2°F | Ht 65.0 in | Wt 294.0 lb

## 2021-08-22 DIAGNOSIS — E1165 Type 2 diabetes mellitus with hyperglycemia: Secondary | ICD-10-CM

## 2021-08-22 DIAGNOSIS — Z6841 Body Mass Index (BMI) 40.0 and over, adult: Secondary | ICD-10-CM

## 2021-08-22 DIAGNOSIS — Z7985 Long-term (current) use of injectable non-insulin antidiabetic drugs: Secondary | ICD-10-CM

## 2021-08-22 DIAGNOSIS — E559 Vitamin D deficiency, unspecified: Secondary | ICD-10-CM

## 2021-08-22 DIAGNOSIS — Z7984 Long term (current) use of oral hypoglycemic drugs: Secondary | ICD-10-CM

## 2021-08-22 DIAGNOSIS — I10 Essential (primary) hypertension: Secondary | ICD-10-CM

## 2021-08-22 DIAGNOSIS — E669 Obesity, unspecified: Secondary | ICD-10-CM

## 2021-08-22 MED ORDER — SEMAGLUTIDE (1 MG/DOSE) 4 MG/3ML ~~LOC~~ SOPN: 1.0000 mg | PEN_INJECTOR | SUBCUTANEOUS | 0 refills | Status: DC

## 2021-08-22 MED ORDER — SPIRONOLACTONE 50 MG PO TABS: 25.0000 mg | ORAL_TABLET | Freq: Every day | ORAL | 0 refills | Status: DC

## 2021-08-22 MED ORDER — VITAMIN D (ERGOCALCIFEROL) 1.25 MG (50000 UNIT) PO CAPS: 50000.0000 [IU] | ORAL_CAPSULE | ORAL | 0 refills | Status: DC

## 2021-08-22 MED ORDER — METFORMIN HCL 500 MG PO TABS: 500.0000 mg | ORAL_TABLET | Freq: Every day | ORAL | 0 refills | Status: DC

## 2021-08-29 NOTE — Progress Notes (Unsigned)
Chief Complaint:   OBESITY Maria Quinn is here to discuss her progress with her obesity treatment plan along with follow-up of her obesity related diagnoses. Maria Quinn is on keeping a food journal and adhering to recommended goals of 1350-1500 calories and 100+ grams of protein and states she is following her eating plan approximately 30% of the time. Maria Quinn states she is exercising 0 minutes 0 times per week.  Today's visit was #: 19 Starting weight: 328 lbs Starting date: 04/13/2020 Today's weight: 294 lbs Today's date: 08/22/2021 Total lbs lost to date: 34 lbs Total lbs lost since last in-office visit: 0  Interim History: Maria Quinn feels less motivated over the last few weeks. She did meal prep but struggled with exercise component. Previously did 2 videos a day from [employer] exercise instructor. She was journaling really well but then fell off doing that. She realizes she has not been as complainant with consistency of journaling.    Subjective:   1. Type 2 diabetes mellitus with hyperglycemia, without long-term current use of insulin (HCC) Maria Quinn is currently on Glucophage and Ozempic. Her last A1c was 6.1 in March 2023.  2. Vitamin D deficiency Maria Quinn is currently taking prescription Vit D 50,000 IU once a week. Denies any nausea, vomiting or muscle weakness. She notes fatigue.  3. Essential hypertension Maria Quinn denies chest pain, chest pressure and headache and dizziness/lightheadedness. She is currently on Norvasc, Cozaar, Aldactone and [place].  Assessment/Plan:   1. Type 2 diabetes mellitus with hyperglycemia, without long-term current use of insulin (HCC) We will refill Ozempic 1 mg SubQ once weekly AND Metformin 500 mg daily for 3 months with 0 refills.  -Refill Semaglutide, 1 MG/DOSE, 4 MG/3ML SOPN; Inject 1 mg as directed once a week.  Dispense: 9 mL; Refill: 0  -Refill metFORMIN (GLUCOPHAGE) 500 MG tablet; Take 1 tablet (500 mg total) by mouth daily with breakfast.  Dispense: 90  tablet; Refill: 0  2. Vitamin D deficiency We will refill Vit D 50,000 IU once a week for 3 months with 0 refills.  -Refill Vitamin D, Ergocalciferol, (DRISDOL) 1.25 MG (50000 UNIT) CAPS capsule; Take 1 capsule (50,000 Units total) by mouth every 7 (seven) days.  Dispense: 12 capsule; Refill: 0  3. Essential hypertension Decrease Spironolactone to 25 mg daily for 3 months with 0 refills.  -Decrease/refill spironolactone (ALDACTONE) 50 MG tablet; Take 0.5 tablets (25 mg total) by mouth daily.  Dispense: 90 tablet; Refill: 0  4. Obesity, with current BMI of 48.9 Maria Quinn is currently in the action stage of change. As such, her goal is to continue with weight loss efforts. She has agreed to keeping a food journal and adhering to recommended goals of 1350-1500 calories and 100+ grams of protein daily.  Exercise goals: All adults should avoid inactivity. Some physical activity is better than none, and adults who participate in any amount of physical activity gain some health benefits.  Behavioral modification strategies: increasing lean protein intake, meal planning and cooking strategies, keeping healthy foods in the home, planning for success, and keeping a strict food journal.  Maria Quinn has agreed to follow-up with our clinic in 4 weeks. She was informed of the importance of frequent follow-up visits to maximize her success with intensive lifestyle modifications for her multiple health conditions.   Objective:   Blood pressure 107/73, pulse 75, temperature 98.2 F (36.8 C), height 5\' 5"  (1.651 m), weight 294 lb (133.4 kg), last menstrual period 10/19/2010, SpO2 97 %. Body mass index is 48.92 kg/m.  General: Cooperative, alert, well developed, in no acute distress. HEENT: Conjunctivae and lids unremarkable. Cardiovascular: Regular rhythm.  Lungs: Normal work of breathing. Neurologic: No focal deficits.   Lab Results  Component Value Date   CREATININE 0.84 03/09/2021   BUN 16 03/09/2021    NA 146 (H) 03/09/2021   K 4.0 03/09/2021   CL 105 03/09/2021   CO2 26 03/09/2021   Lab Results  Component Value Date   ALT 12 03/09/2021   AST 10 03/09/2021   ALKPHOS 50 03/09/2021   BILITOT 0.4 03/09/2021   Lab Results  Component Value Date   HGBA1C 6.1 (H) 03/09/2021   HGBA1C 6.7 (H) 09/14/2020   HGBA1C 7.7 (H) 04/13/2020   HGBA1C 6.4 01/29/2018   Lab Results  Component Value Date   INSULIN 13.0 03/09/2021   INSULIN 17.8 09/14/2020   INSULIN 24.1 04/13/2020   Lab Results  Component Value Date   TSH 1.490 04/13/2020   Lab Results  Component Value Date   CHOL 160 03/09/2021   HDL 61 03/09/2021   LDLCALC 82 03/09/2021   TRIG 89 03/09/2021   CHOLHDL 2.6 03/09/2021   Lab Results  Component Value Date   VD25OH 30.4 03/09/2021   VD25OH 41.9 09/14/2020   VD25OH 25.7 (L) 04/13/2020   Lab Results  Component Value Date   WBC 6.2 04/13/2020   HGB 12.5 04/13/2020   HCT 41.3 04/13/2020   MCV 76 (L) 04/13/2020   PLT 240 04/13/2020   Lab Results  Component Value Date   IRON 77 04/13/2020   TIBC 334 04/13/2020   FERRITIN 88 04/13/2020   Attestation Statements:   Reviewed by clinician on day of visit: allergies, medications, problem list, medical history, surgical history, family history, social history, and previous encounter notes.  I, Fortino Sic, RMA am acting as transcriptionist for Reuben Likes, MD. I have reviewed the above documentation for accuracy and completeness, and I agree with the above. - Reuben Likes, MD

## 2021-09-21 ENCOUNTER — Ambulatory Visit (INDEPENDENT_AMBULATORY_CARE_PROVIDER_SITE_OTHER): Payer: Self-pay | Admitting: Family Medicine

## 2021-11-16 ENCOUNTER — Telehealth: Payer: Self-pay | Admitting: Internal Medicine

## 2021-11-16 NOTE — Telephone Encounter (Signed)
Pt of DR TMS.  Received notification that she is overdue mammogram.  Dr [employer] has ordered.  Need to schedule.

## 2021-11-16 NOTE — Telephone Encounter (Signed)
Pt has been notified.

## 2021-12-06 ENCOUNTER — Ambulatory Visit: Payer: 59 | Admitting: Internal Medicine

## 2022-01-17 ENCOUNTER — Encounter: Payer: Self-pay | Admitting: Nurse Practitioner

## 2022-01-17 ENCOUNTER — Ambulatory Visit: Payer: 59 | Admitting: Nurse Practitioner

## 2022-01-17 VITALS — BP 118/78 | HR 89 | Temp 98.1°F | Ht 66.25 in | Wt 302.2 lb

## 2022-01-17 DIAGNOSIS — I10 Essential (primary) hypertension: Secondary | ICD-10-CM

## 2022-01-17 DIAGNOSIS — D649 Anemia, unspecified: Secondary | ICD-10-CM | POA: Diagnosis not present

## 2022-01-17 DIAGNOSIS — G4733 Obstructive sleep apnea (adult) (pediatric): Secondary | ICD-10-CM | POA: Diagnosis not present

## 2022-01-17 DIAGNOSIS — Z6841 Body Mass Index (BMI) 40.0 and over, adult: Secondary | ICD-10-CM

## 2022-01-17 DIAGNOSIS — Z9884 Bariatric surgery status: Secondary | ICD-10-CM | POA: Insufficient documentation

## 2022-01-17 DIAGNOSIS — Z903 Acquired absence of stomach [part of]: Secondary | ICD-10-CM | POA: Diagnosis not present

## 2022-01-17 DIAGNOSIS — E1165 Type 2 diabetes mellitus with hyperglycemia: Secondary | ICD-10-CM

## 2022-01-17 DIAGNOSIS — Z980 Intestinal bypass and anastomosis status: Secondary | ICD-10-CM

## 2022-01-17 LAB — MICROALBUMIN / CREATININE URINE RATIO
Creatinine,U: 163.1 mg/dL
Microalb Creat Ratio: 7.7 mg/g (ref 0.0–30.0)
Microalb, Ur: 12.5 mg/dL — ABNORMAL HIGH (ref 0.0–1.9)

## 2022-01-17 LAB — RENAL FUNCTION PANEL
Albumin: 4.1 g/dL (ref 3.5–5.2)
BUN: 12 mg/dL (ref 6–23)
CO2: 33 mEq/L — ABNORMAL HIGH (ref 19–32)
Calcium: 8.7 mg/dL (ref 8.4–10.5)
Chloride: 102 mEq/L (ref 96–112)
Creatinine, Ser: 0.79 mg/dL (ref 0.40–1.20)
GFR: 91.88 mL/min (ref >60.00)
Glucose, Bld: 112 mg/dL — ABNORMAL HIGH (ref 70–99)
Phosphorus: 3.5 mg/dL (ref 2.3–4.6)
Potassium: 4 mEq/L (ref 3.5–5.1)
Sodium: 144 mEq/L (ref 135–145)

## 2022-01-17 LAB — CBC WITH DIFFERENTIAL/PLATELET
Basophils Absolute: 0 10*3/uL (ref 0.0–0.1)
Basophils Relative: 0.5 % (ref 0.0–3.0)
Eosinophils Absolute: 0 10*3/uL (ref 0.0–0.7)
Eosinophils Relative: 0.9 % (ref 0.0–5.0)
HCT: 39.6 % (ref 36.0–46.0)
Hemoglobin: 12.2 g/dL (ref 12.0–15.0)
Lymphocytes Relative: 37.7 % (ref 12.0–46.0)
Lymphs Abs: 1.8 10*3/uL (ref 0.7–4.0)
MCHC: 30.9 g/dL (ref 30.0–36.0)
MCV: 73.9 fl — ABNORMAL LOW (ref 78.0–100.0)
Monocytes Absolute: 0.6 10*3/uL (ref 0.1–1.0)
Monocytes Relative: 12.1 % — ABNORMAL HIGH (ref 3.0–12.0)
Neutro Abs: 2.3 10*3/uL (ref 1.4–7.7)
Neutrophils Relative %: 48.8 % (ref 43.0–77.0)
Platelets: 210 10*3/uL (ref 150.0–400.0)
RBC: 5.35 Mil/uL — ABNORMAL HIGH (ref 3.87–5.11)
RDW: 17.3 % — ABNORMAL HIGH (ref 11.5–15.5)
WBC: 4.8 10*3/uL (ref 4.0–10.5)

## 2022-01-17 LAB — HEMOGLOBIN A1C: Hgb A1c MFr Bld: 6.5 % (ref 4.6–6.5)

## 2022-01-17 MED ORDER — AMLODIPINE BESYLATE 5 MG PO TABS: 5.0000 mg | ORAL_TABLET | Freq: Every day | ORAL | 3 refills | Status: DC

## 2022-01-17 MED ORDER — CHLORTHALIDONE 25 MG PO TABS: 25.0000 mg | ORAL_TABLET | Freq: Every day | ORAL | 1 refills | Status: DC

## 2022-01-17 MED ORDER — METFORMIN HCL 500 MG PO TABS: 500.0000 mg | ORAL_TABLET | Freq: Every day | ORAL | 1 refills | Status: DC

## 2022-01-17 MED ORDER — SPIRONOLACTONE 25 MG PO TABS: 25.0000 mg | ORAL_TABLET | Freq: Every day | ORAL | 1 refills | Status: DC

## 2022-01-17 MED ORDER — SEMAGLUTIDE (1 MG/DOSE) 4 MG/3ML ~~LOC~~ SOPN: 1.0000 mg | PEN_INJECTOR | SUBCUTANEOUS | 1 refills | Status: DC

## 2022-01-17 NOTE — Patient Instructions (Addendum)
Go to lab Continue to hold losartan Schedule appt with ophthalmology Have eye exam report faxed to me. You will be contacted to schedule appt with sleep clinic.

## 2022-01-17 NOTE — Assessment & Plan Note (Addendum)
Diagnosed 2010, no CPAP used No AM AH, no daytime somnolence, no excessive fatigue. persistent snoring and daytime fatigue.  Entered referral to sleep clinic.

## 2022-01-17 NOTE — Assessment & Plan Note (Deleted)
Weight before surgery 380 Lowest weight 140lbs in 2012

## 2022-01-17 NOTE — Assessment & Plan Note (Addendum)
Off losartan x 23months BP controlled with amlodipine, spirolactone and chlorthialidone BP Readings from Last 3 Encounters:  01/17/22 118/78  08/22/21 107/73  07/25/21 106/75    Repeat BMP Maintain med doses Continue to hold losartan at this time. F/up in 76months

## 2022-01-17 NOTE — Progress Notes (Signed)
Established Patient Visit  Patient: Maria Quinn   DOB: Oct 01, 1980   42 y.o. Female  MRN: [id number] Visit Date: 01/17/2022  Subjective:    Chief Complaint  Patient presents with  . transfer of care    Medication refills. HTN and DM. Weight loss management. Prev seen at [facility].   HPI Transfer from [employer]   Essential hypertension Off losartan x 6months BP controlled with amlodipine, spirolactone and chlorthialidone BP Readings from Last 3 Encounters:  01/17/22 118/78  08/22/21 107/73  07/25/21 106/75    Repeat BMP Maintain med doses Continue to hold losartan at this time. F/up in 3months  OSA (obstructive sleep apnea) Diagnosed 2010, no CPAP used No AM AH, no daytime somnolence, no excessive fatigue. persistent snoring and daytime fatigue.  Entered referral to sleep clinic.  DM (diabetes mellitus) [facility]) Ophthalmology: Loann Quill. No retinopathy per patient, report requested no neuropathy. Fasting glucose: 80. Last hgbA1c at 6.1% LDL at goal Current use of metformin and ozempic.  Maintain med dose Repeat BMP, urine microalbumin F/up in 3months  Morbid obesity with BMI of 45.0-49.9, adult (HCC) S/p gastric Roux en Y2010, weight loss 380 to 140lbs. No exercise in last 2weeks Plans to resume exercise regimen: 4x/week Diet: pescaterean,counts calorie, maintain about 1500kcal daily. Unable to return to weight and wellness clinic due to change in insurance coverage. Current use of ozempic and metformin Wt Readings from Last 3 Encounters:  01/17/22 (!) 302 lb 3.2 oz (137.1 kg)  08/22/21 294 lb (133.4 kg)  07/25/21 294 lb (133.4 kg)     Anemia Normal anemia panel 2022: B12, iron and folate. Repeat cbc today  Reviewed medical, surgical, and social history today  Medications: Outpatient Medications Prior to Visit  Medication Sig  . Ascorbic Acid (VITAMIN C PO) Take 1,000 mg by mouth daily.  . blood glucose  meter kit and supplies Dispense based on patient and insurance preference. Use up to two times daily as directed. (FOR ICD-10 E10.9, E11.9). 1 year supplies  . Cholecalciferol (VITAMIN D) 50 MCG (2000 UT) CAPS Take by mouth daily.  . Multiple Vitamins-Minerals (CENTRUM WOMEN PO) Take by mouth.  . [DISCONTINUED] amLODipine (NORVASC) 5 MG tablet Take 1 tablet (5 mg total) by mouth daily. D/c 10 mg  . [DISCONTINUED] chlorthalidone (HYGROTON) 25 MG tablet Take 1 tablet (25 mg total) by mouth daily.  . [DISCONTINUED] metFORMIN (GLUCOPHAGE) 500 MG tablet Take 1 tablet (500 mg total) by mouth daily with breakfast.  . [DISCONTINUED] Semaglutide, 1 MG/DOSE, 4 MG/3ML SOPN Inject 1 mg as directed once a week.  . [DISCONTINUED] spironolactone (ALDACTONE) 50 MG tablet Take 0.5 tablets (25 mg total) by mouth daily.  . [DISCONTINUED] Vitamin D, Ergocalciferol, (DRISDOL) 1.25 MG (50000 UNIT) CAPS capsule Take 1 capsule (50,000 Units total) by mouth every 7 (seven) days.  . [DISCONTINUED] losartan (COZAAR) 50 MG tablet Take 1 tablet (50 mg total) by mouth daily. At night (Patient not taking: Reported on 01/17/2022)  . [DISCONTINUED] Sodium Fluoride (PREVIDENT 5000 BOOSTER PLUS) 1.1 % PSTE PreviDent 5000 Booster Plus 1.1 % dental paste (Patient not taking: Reported on 01/17/2022)   No facility-administered medications prior to visit.   Reviewed past medical and social history.   ROS per HPI above      Objective:  BP 118/78 (BP Location: Right Arm, Patient Position: Sitting)   Pulse 89   Temp 98.1 F (36.7 C) (  Oral)   Ht 5' 6.25" (1.683 m)   Wt (!) 302 lb 3.2 oz (137.1 kg)   LMP 10/19/2010 Comment: No periods   BMI 48.41 kg/m      Physical Exam Constitutional:      General: She is not in acute distress.    Appearance: She is obese.  Cardiovascular:     Rate and Rhythm: Normal rate and regular rhythm.     Pulses: Normal pulses.     Heart sounds: Normal heart sounds.  Pulmonary:     Effort:  Pulmonary effort is normal.     Breath sounds: Normal breath sounds.  Musculoskeletal:     Right lower leg: No edema.     Left lower leg: No edema.  Neurological:     Mental Status: She is alert and oriented to person, place, and time.  Psychiatric:        Mood and Affect: Mood normal.        Behavior: Behavior normal.        Thought Content: Thought content normal.    No results found for any visits on 01/17/22.    Assessment & Plan:    Problem List Items Addressed This Visit       Cardiovascular and Mediastinum   Essential hypertension - Primary    Off losartan x 6months BP controlled with amlodipine, spirolactone and chlorthialidone BP Readings from Last 3 Encounters:  01/17/22 118/78  08/22/21 107/73  07/25/21 106/75    Repeat BMP Maintain med doses Continue to hold losartan at this time. F/up in 3months      Relevant Medications   amLODipine (NORVASC) 5 MG tablet   chlorthalidone (HYGROTON) 25 MG tablet   spironolactone (ALDACTONE) 25 MG tablet   Other Relevant Orders   Renal Function Panel     Respiratory   OSA (obstructive sleep apnea)    Diagnosed 2010, no CPAP used No AM AH, no daytime somnolence, no excessive fatigue. persistent snoring and daytime fatigue.  Entered referral to sleep clinic.      Relevant Orders   Ambulatory referral to Sleep Studies     Endocrine   DM (diabetes mellitus) [facility])    Ophthalmology: Loann Quill. No retinopathy per patient, report requested no neuropathy. Fasting glucose: 80. Last hgbA1c at 6.1% LDL at goal Current use of metformin and ozempic.  Maintain med dose Repeat BMP, urine microalbumin F/up in 3months      Relevant Medications   metFORMIN (GLUCOPHAGE) 500 MG tablet   Semaglutide, 1 MG/DOSE, 4 MG/3ML SOPN   Other Relevant Orders   Hemoglobin A1c   Renal Function Panel   Microalbumin / creatinine urine ratio     Other   Anemia    Normal anemia panel 2022: B12, iron and folate. Repeat cbc  today      Relevant Orders   CBC with Differential/Platelet   Morbid obesity with BMI of 45.0-49.9, adult (HCC)    S/p gastric Roux en Y2010, weight loss 380 to 140lbs. No exercise in last 2weeks Plans to resume exercise regimen: 4x/week Diet: pescaterean,counts calorie, maintain about 1500kcal daily. Unable to return to weight and wellness clinic due to change in insurance coverage. Current use of ozempic and metformin Wt Readings from Last 3 Encounters:  01/17/22 (!) 302 lb 3.2 oz (137.1 kg)  08/22/21 294 lb (133.4 kg)  07/25/21 294 lb (133.4 kg)         Relevant Medications   metFORMIN (GLUCOPHAGE) 500 MG tablet   Semaglutide, 1  MG/DOSE, 4 MG/3ML SOPN   S/P total gastrectomy and Roux-en-Y esophagojejunal anastomosis   Return in about 3 months (around 04/18/2022) for HTN, DM, hyperlipidemia (fasting).     Alysia Penna, NP

## 2022-01-17 NOTE — Assessment & Plan Note (Addendum)
Normal anemia panel 2022: B12, iron and folate. Repeat cbc today

## 2022-01-17 NOTE — Assessment & Plan Note (Addendum)
S/p gastric Roux en Y2010, weight loss 380 to 140lbs. No exercise in last 2weeks Plans to resume exercise regimen: 4x/week Diet: pescaterean,counts calorie, maintain about 1500kcal daily. Unable to return to weight and wellness clinic due to change in insurance coverage. Current use of ozempic and metformin Wt Readings from Last 3 Encounters:  01/17/22 (!) 302 lb 3.2 oz (137.1 kg)  08/22/21 294 lb (133.4 kg)  07/25/21 294 lb (133.4 kg)

## 2022-01-17 NOTE — Assessment & Plan Note (Addendum)
Ophthalmology: Maria Quinn. No retinopathy per patient, report requested no neuropathy. Fasting glucose: 80. Last hgbA1c at 6.1% LDL at goal Current use of metformin and ozempic.  Maintain med dose Repeat BMP, urine microalbumin F/up in 28months

## 2022-01-24 ENCOUNTER — Telehealth: Payer: Self-pay

## 2022-01-24 NOTE — Telephone Encounter (Signed)
Patient needs to schedule an office visit with Dr. Brett Fairy - last seen in 2022 so does not need a referral to see Dr. Brett Fairy and discuss sleep concerns.

## 2022-04-13 ENCOUNTER — Ambulatory Visit: Payer: 59 | Admitting: Neurology

## 2022-04-18 ENCOUNTER — Ambulatory Visit: Payer: Self-pay | Admitting: Nurse Practitioner

## 2022-04-25 ENCOUNTER — Encounter: Payer: Self-pay | Admitting: Nurse Practitioner

## 2022-04-25 ENCOUNTER — Ambulatory Visit (INDEPENDENT_AMBULATORY_CARE_PROVIDER_SITE_OTHER): Payer: Self-pay | Admitting: Nurse Practitioner

## 2022-04-25 VITALS — BP 102/80 | HR 64 | Temp 98.3°F | Resp 16 | Ht 65.0 in | Wt 298.8 lb

## 2022-04-25 DIAGNOSIS — E1159 Type 2 diabetes mellitus with other circulatory complications: Secondary | ICD-10-CM

## 2022-04-25 DIAGNOSIS — E1169 Type 2 diabetes mellitus with other specified complication: Secondary | ICD-10-CM

## 2022-04-25 DIAGNOSIS — Z83511 Family history of glaucoma: Secondary | ICD-10-CM

## 2022-04-25 DIAGNOSIS — Z1231 Encounter for screening mammogram for malignant neoplasm of breast: Secondary | ICD-10-CM

## 2022-04-25 DIAGNOSIS — I152 Hypertension secondary to endocrine disorders: Secondary | ICD-10-CM

## 2022-04-25 NOTE — Assessment & Plan Note (Signed)
Off losartan x 6months BP controlled with amlodipine, spirolactone and chlorthialidone BP Readings from Last 3 Encounters:  04/25/22 102/80  01/17/22 118/78  08/22/21 107/73    Repeat BMP Maintain med doses F/up in 6months

## 2022-04-25 NOTE — Progress Notes (Signed)
Established Patient Visit  Patient: Maria Quinn   DOB: 1980/09/01   42 y.o. Female  MRN: [id number] Visit Date: 04/25/2022  Subjective:    Chief Complaint  Patient presents with   Medical Management of Chronic Issues    Fasting- Yes    HPI Essential hypertension Off losartan x 6months BP controlled with amlodipine, spirolactone and chlorthialidone BP Readings from Last 3 Encounters:  04/25/22 102/80  01/17/22 118/78  08/22/21 107/73    Repeat BMP Maintain med doses F/up in 6months  DM (diabetes mellitus) (HCC) Ophthalmology: Loann Quill. No retinopathy per patient, report requested no neuropathy. Fasting glucose: 98 Exercise: 3-4x/week, walking . Last hgbA1c at 6.6% LDL at goal Controlled with metformin and ozempic. No adverse effects  Maintain med dose Repeat CMP and lipid panel F/up in 6months  Wt Readings from Last 3 Encounters:  04/25/22 298 lb 12.8 oz (135.5 kg)  01/17/22 (!) 302 lb 3.2 oz (137.1 kg)  08/22/21 294 lb (133.4 kg)    Reviewed medical, surgical, and social history today  Medications: Outpatient Medications Prior to Visit  Medication Sig   amLODipine (NORVASC) 5 MG tablet Take 1 tablet (5 mg total) by mouth daily.   Ascorbic Acid (VITAMIN C PO) Take 1,000 mg by mouth daily.   blood glucose meter kit and supplies Dispense based on patient and insurance preference. Use up to two times daily as directed. (FOR ICD-10 E10.9, E11.9). 1 year supplies   chlorthalidone (HYGROTON) 25 MG tablet Take 1 tablet (25 mg total) by mouth daily.   Cholecalciferol (VITAMIN D) 50 MCG (2000 UT) CAPS Take by mouth daily.   metFORMIN (GLUCOPHAGE) 500 MG tablet Take 1 tablet (500 mg total) by mouth daily with breakfast.   Multiple Vitamins-Minerals (CENTRUM WOMEN PO) Take by mouth.   Semaglutide, 1 MG/DOSE, 4 MG/3ML SOPN Inject 1 mg as directed once a week.   spironolactone (ALDACTONE) 25 MG tablet Take 1 tablet (25 mg total) by mouth daily.    No facility-administered medications prior to visit.   Reviewed past medical and social history.   ROS per HPI above      Objective:  BP 102/80 (BP Location: Left Arm, Patient Position: Sitting, Cuff Size: Large)   Pulse 64   Temp 98.3 F (36.8 C) (Temporal)   Resp 16   Ht 5\' 5"  (1.651 m)   Wt 298 lb 12.8 oz (135.5 kg)   LMP 10/19/2010 Comment: No periods   SpO2 97%   BMI 49.72 kg/m      Physical Exam Cardiovascular:     Rate and Rhythm: Normal rate and regular rhythm.     Pulses: Normal pulses.     Heart sounds: Normal heart sounds.  Pulmonary:     Effort: Pulmonary effort is normal.     Breath sounds: Normal breath sounds.  Neurological:     Mental Status: She is alert and oriented to person, place, and time.     No results found for any visits on 04/25/22.    Assessment & Plan:    Problem List Items Addressed This Visit       Cardiovascular and Mediastinum   Essential hypertension    Off losartan x 6months BP controlled with amlodipine, spirolactone and chlorthialidone BP Readings from Last 3 Encounters:  04/25/22 102/80  01/17/22 118/78  08/22/21 107/73    Repeat BMP Maintain med doses F/up in 6months  Endocrine   DM (diabetes mellitus) - Primary    Ophthalmology: Loann Quill. No retinopathy per patient, report requested no neuropathy. Fasting glucose: 98 Exercise: 3-4x/week, walking . Last hgbA1c at 6.6% LDL at goal Controlled with metformin and ozempic. No adverse effects  Maintain med dose Repeat CMP and lipid panel F/up in 6months      Relevant Orders   Hemoglobin A1c   Basic metabolic panel   Lipid panel   Ambulatory referral to Ophthalmology   Hepatic function panel   Other Visit Diagnoses     Breast cancer screening by mammogram       Relevant Orders   MM 3D SCREENING MAMMOGRAM BILATERAL BREAST   Family history of glaucoma in father       Relevant Orders   Ambulatory referral to Ophthalmology       Return in about 6 months (around 10/25/2022) for HTN, DM, hyperlipidemia (fasting).     Alysia Penna, NP

## 2022-04-25 NOTE — Patient Instructions (Addendum)
Maintain current medications Go to lab  Diabetes Mellitus and Exercise Regular exercise is important for your health, especially if you have diabetes mellitus. Exercise is not just about losing weight. It can also help you increase muscle strength and bone density and reduce body fat and stress. This can help your level of endurance and make you more fit and flexible. Why should I exercise if I have diabetes? Exercise has many benefits for people with diabetes. It can: Help lower and control your blood sugar (glucose). Help your body respond better and become more sensitive to the hormone insulin. Reduce how much insulin your body needs. Lower your risk for heart disease by: Lowering how much "bad" cholesterol and triglycerides you have in your body. Increasing how much "good" cholesterol you have in your body. Lowering your blood pressure. Lowering your blood glucose levels. What is my activity plan? Your health care provider or an expert trained in diabetes care (certified diabetes educator) can help you make an activity plan. This plan can help you find the type of exercise that works for you. It may also tell you how often to exercise and for how long. Be sure to: Get at least 150 minutes of medium-intensity or high-intensity exercise each week. This may involve brisk walking, biking, or water aerobics. Do stretching and strengthening exercises at least 2 times a week. This may involve yoga or weight lifting. Spread out your activity over at least 3 days of the week. Get some form of physical activity each day. Do not go more than 2 days in a row without some kind of activity. Avoid being inactive for more than 30 minutes at a time. Take frequent breaks to walk or stretch. Choose activities that you enjoy. Set goals that you know you can accomplish. Start slowly and increase the intensity of your exercise over time. How do I manage my diabetes during exercise?  Monitor your blood  glucose Check your blood glucose before and after you exercise. If your blood glucose is 240 mg/dL (16.1 mmol/L) or higher before you exercise, check your urine for ketones. These are chemicals created by the liver. If you have ketones in your urine, do not exercise until your blood glucose returns to normal. If your blood glucose is 100 mg/dL (5.6 mmol/L) or lower, eat a snack that has 15-20 grams of carbohydrate in it. Check your blood glucose 15 minutes after the snack to make sure that your level is above 100 mg/dL (5.6 mmol/L) before you start to exercise. Your risk for low blood glucose (hypoglycemia) goes up during and after exercise. Know the symptoms of this condition and how to treat it. Follow these instructions at home: Keep a carbohydrate snack on hand for use before, during, and after exercise. This can help prevent or treat hypoglycemia. Avoid injecting insulin into parts of your body that are going to be used during exercise. This may include: Your arms, when you are going to play tennis. Your legs, when you are about to go jogging. Keep track of your exercise habits. This can help you and your health care provider watch and adjust your activity plan. Write down: What you eat before and after you exercise. Blood glucose levels before and after you exercise. The type and amount of exercise you do. Talk to your health care provider before you start a new activity. They may need to: Make sure that the activity is safe for you. Adjust your insulin, other medicines, and food that you eat.  Drink water while you exercise. This can stop you from losing too much water (dehydration). It can also prevent problems caused by having a lot of heat in your body (heat stroke). Where to find more information American Diabetes Association: diabetes.org Association of Diabetes Care & Education Specialists: diabeteseducator.org This information is not intended to replace advice given to you by your  health care provider. Make sure you discuss any questions you have with your health care provider. Document Revised: 06/15/2021 Document Reviewed: 06/15/2021 Elsevier Patient Education  2023 [employer].

## 2022-04-25 NOTE — Assessment & Plan Note (Addendum)
Ophthalmology: Maria Quinn. No retinopathy per patient, report requested no neuropathy. Fasting glucose: 98 Exercise: 3-4x/week, walking . Last hgbA1c at 6.6% LDL at goal Controlled with metformin and ozempic. No adverse effects  Maintain med dose Repeat CMP and lipid panel F/up in 6months

## 2022-04-26 ENCOUNTER — Other Ambulatory Visit (INDEPENDENT_AMBULATORY_CARE_PROVIDER_SITE_OTHER): Payer: 59

## 2022-04-26 DIAGNOSIS — E1165 Type 2 diabetes mellitus with hyperglycemia: Secondary | ICD-10-CM

## 2022-04-26 DIAGNOSIS — E1169 Type 2 diabetes mellitus with other specified complication: Secondary | ICD-10-CM

## 2022-04-26 DIAGNOSIS — I10 Essential (primary) hypertension: Secondary | ICD-10-CM

## 2022-04-26 LAB — BASIC METABOLIC PANEL
BUN: 12 mg/dL (ref 6–23)
CO2: 32 mEq/L (ref 19–32)
Calcium: 8.9 mg/dL (ref 8.4–10.5)
Chloride: 104 mEq/L (ref 96–112)
Creatinine, Ser: 0.81 mg/dL (ref 0.40–1.20)
GFR: 89 mL/min (ref >60.00)
Glucose, Bld: 87 mg/dL (ref 70–99)
Potassium: 3.6 mEq/L (ref 3.5–5.1)
Sodium: 145 mEq/L (ref 135–145)

## 2022-04-26 LAB — HEMOGLOBIN A1C: Hgb A1c MFr Bld: 5.9 % (ref 4.6–6.5)

## 2022-04-26 LAB — LIPID PANEL
Cholesterol: 163 mg/dL (ref 0–200)
HDL: 63.3 mg/dL (ref >39.00)
LDL Cholesterol: 77 mg/dL (ref 0–99)
NonHDL: 100.08
Total CHOL/HDL Ratio: 3
Triglycerides: 114 mg/dL (ref 0.0–149.0)
VLDL: 22.8 mg/dL (ref 0.0–40.0)

## 2022-04-26 MED ORDER — METFORMIN HCL 500 MG PO TABS: 500.0000 mg | ORAL_TABLET | Freq: Every day | ORAL | 1 refills | Status: DC

## 2022-04-26 MED ORDER — SPIRONOLACTONE 25 MG PO TABS
25.0000 mg | ORAL_TABLET | Freq: Every day | ORAL | 1 refills | Status: DC
Start: 2022-04-26 — End: 2023-02-07

## 2022-04-26 MED ORDER — CHLORTHALIDONE 25 MG PO TABS: 25.0000 mg | ORAL_TABLET | Freq: Every day | ORAL | 1 refills | Status: DC

## 2022-04-26 MED ORDER — SEMAGLUTIDE (1 MG/DOSE) 4 MG/3ML ~~LOC~~ SOPN
1.0000 mg | PEN_INJECTOR | SUBCUTANEOUS | 1 refills | Status: DC
Start: 2022-04-26 — End: 2022-04-27

## 2022-04-26 NOTE — Addendum Note (Signed)
Addended by: Michaela Corner on: 04/26/2022 02:37 PM   Modules accepted: Orders

## 2022-04-27 ENCOUNTER — Other Ambulatory Visit: Payer: 59 | Admitting: Nurse Practitioner

## 2022-04-27 DIAGNOSIS — E1165 Type 2 diabetes mellitus with hyperglycemia: Secondary | ICD-10-CM

## 2022-04-27 NOTE — Telephone Encounter (Signed)
Spoke w/ Pt- made aware of Rx change. She made me aware of insurance issues- she was under the impression that insurance hadn't been reactivated yet. Recommended she follow-up w/ insurance and pharmacy. Will also send to [facility] to see if she can see that insurance is activated or not.

## 2022-04-27 NOTE — Assessment & Plan Note (Signed)
Switch ozempic  to trulicity  1.5mg  weekly due to cost

## 2022-05-01 ENCOUNTER — Inpatient Hospital Stay: Admission: RE | Admit: 2022-05-01 | Payer: 59 | Source: Ambulatory Visit

## 2022-05-01 ENCOUNTER — Telehealth: Payer: Self-pay | Admitting: Nurse Practitioner

## 2022-05-01 NOTE — Telephone Encounter (Signed)
Referral moved to [facility] letter sent to patient via mychart

## 2022-05-01 NOTE — Telephone Encounter (Signed)
Maria Quinn placed a referral for pt to [facility] on 04/25/22. [facility] called stating this must be sent to a general ophthalmology office first and then if there is medical treatment needed, she could be referred back to them.

## 2022-05-18 ENCOUNTER — Telehealth: Payer: Self-pay

## 2022-05-18 NOTE — Telephone Encounter (Signed)
PA initiated via Covermymeds; KEY: [id number].  Awaiting determination.

## 2022-05-19 NOTE — Telephone Encounter (Signed)
PA approved. this request is approved from 05/19/2022 to 05/19/2023

## 2022-05-24 ENCOUNTER — Encounter: Payer: Self-pay | Admitting: Nurse Practitioner

## 2022-05-24 DIAGNOSIS — M5412 Radiculopathy, cervical region: Secondary | ICD-10-CM

## 2022-05-24 DIAGNOSIS — M5030 Other cervical disc degeneration, unspecified cervical region: Secondary | ICD-10-CM

## 2022-05-29 ENCOUNTER — Encounter: Payer: Self-pay | Admitting: Nurse Practitioner

## 2022-05-29 ENCOUNTER — Ambulatory Visit (INDEPENDENT_AMBULATORY_CARE_PROVIDER_SITE_OTHER)
Admission: RE | Admit: 2022-05-29 | Discharge: 2022-05-29 | Disposition: A | Discharge status: Home / Self Care | Payer: 59 | Source: Ambulatory Visit | Attending: Nurse Practitioner | Admitting: Nurse Practitioner

## 2022-05-29 ENCOUNTER — Ambulatory Visit
Admission: RE | Admit: 2022-05-29 | Discharge: 2022-05-29 | Disposition: A | Discharge status: Home / Self Care | Payer: 59 | Source: Ambulatory Visit | Attending: Nurse Practitioner | Admitting: Nurse Practitioner

## 2022-05-29 ENCOUNTER — Ambulatory Visit: Payer: 59 | Admitting: Nurse Practitioner

## 2022-05-29 VITALS — BP 122/80 | HR 77 | Temp 98.3°F | Resp 16 | Ht 65.0 in | Wt 293.0 lb

## 2022-05-29 DIAGNOSIS — M5412 Radiculopathy, cervical region: Secondary | ICD-10-CM

## 2022-05-29 DIAGNOSIS — M5030 Other cervical disc degeneration, unspecified cervical region: Secondary | ICD-10-CM

## 2022-05-29 DIAGNOSIS — M25512 Pain in left shoulder: Secondary | ICD-10-CM

## 2022-05-29 DIAGNOSIS — Z1231 Encounter for screening mammogram for malignant neoplasm of breast: Secondary | ICD-10-CM

## 2022-05-29 MED ORDER — TIZANIDINE HCL 4 MG PO TABS
4.0000 mg | ORAL_TABLET | Freq: Three times a day (TID) | ORAL | 0 refills | Status: DC | PRN
Start: 2022-05-29 — End: 2022-06-04

## 2022-05-29 MED ORDER — PREDNISONE 20 MG PO TABS
ORAL_TABLET | ORAL | 0 refills | Status: AC
Start: 2022-05-29 — End: 2022-06-06

## 2022-05-29 NOTE — Patient Instructions (Signed)
Go to [address] for neck x-ray Start neck and shoulder exercise. Call office if no improvement in 2weeks.  Shoulder Exercises Ask your health care provider which exercises are safe for you. Do exercises exactly as told by your health care provider and adjust them as directed. It is normal to feel mild stretching, pulling, tightness, or discomfort as you do these exercises. Stop right away if you feel sudden pain or your pain gets worse. Do not begin these exercises until told by your health care provider. Stretching exercises External rotation and abduction This exercise is sometimes called corner stretch. The exercise rotates your arm outward (external rotation) and moves your arm out from your body (abduction). Stand in a doorway with one of your feet slightly in front of the other. This is called a staggered stance. If you cannot reach your forearms to the door frame, stand facing a corner of a room. Choose one of the following positions as told by your health care provider: Place your hands and forearms on the door frame above your head. Place your hands and forearms on the door frame at the height of your head. Place your hands on the door frame at the height of your elbows. Slowly move your weight onto your front foot until you feel a stretch across your chest and in the front of your shoulders. Keep your head and chest upright and keep your abdominal muscles tight. Hold for __________ seconds. To release the stretch, shift your weight to your back foot. Repeat __________ times. Complete this exercise __________ times a day. Extension, standing  Stand and hold a broomstick, a cane, or a similar object behind your back. Your hands should be a little wider than shoulder-width apart. Your palms should face away from your back. Keeping your elbows straight and your shoulder muscles relaxed, move the stick away from your body until you feel a stretch in your shoulders  (extension). Avoid shrugging your shoulders while you move the stick. Keep your shoulder blades tucked down toward the middle of your back. Hold for __________ seconds. Slowly return to the starting position. Repeat __________ times. Complete this exercise __________ times a day. Range-of-motion exercises Pendulum  Stand near a wall or a surface that you can hold onto for balance. Bend at the waist and let your left / right arm hang straight down. Use your other arm to support you. Keep your back straight and do not lock your knees. Relax your left / right arm and shoulder muscles, and move your hips and your trunk so your left / right arm swings freely. Your arm should swing because of the motion of your body, not because you are using your arm or shoulder muscles. Keep moving your hips and trunk so your arm swings in the following directions, as told by your health care provider: Side to side. Forward and backward. In clockwise and counterclockwise circles. Continue each motion for __________ seconds, or for as long as told by your health care provider. Slowly return to the starting position. Repeat __________ times. Complete this exercise __________ times a day. Shoulder flexion, standing  Stand and hold a broomstick, a cane, or a similar object. Place your hands a little more than shoulder-width apart on the object. Your left / right hand should be palm-up, and your other hand should be palm-down. Keep your elbow straight and your shoulder muscles relaxed. Push the stick up with your healthy arm to raise your left / right arm in  front of your body, and then over your head until you feel a stretch in your shoulder (flexion). Avoid shrugging your shoulder while you raise your arm. Keep your shoulder blade tucked down toward the middle of your back. Hold for __________ seconds. Slowly return to the starting position. Repeat __________ times. Complete this exercise __________ times a  day. Shoulder abduction, standing  Stand and hold a broomstick, a cane, or a similar object. Place your hands a little more than shoulder-width apart on the object. Your left / right hand should be palm-up, and your other hand should be palm-down. Keep your elbow straight and your shoulder muscles relaxed. Push the object across your body toward your left / right side. Raise your left / right arm to the side of your body (abduction) until you feel a stretch in your shoulder. Do not raise your arm above shoulder height unless your health care provider tells you to do that. If directed, raise your arm over your head. Avoid shrugging your shoulder while you raise your arm. Keep your shoulder blade tucked down toward the middle of your back. Hold for __________ seconds. Slowly return to the starting position. Repeat __________ times. Complete this exercise __________ times a day. Internal rotation  Place your left / right hand behind your back, palm-up. Use your other hand to dangle an exercise band, a broomstick, or a similar object over your shoulder. Grasp the band with your left / right hand so you are holding on to both ends. Gently pull up on the band until you feel a stretch in the front of your left / right shoulder. The movement of your arm toward the center of your body is called internal rotation. Avoid shrugging your shoulder while you raise your arm. Keep your shoulder blade tucked down toward the middle of your back. Hold for __________ seconds. Release the stretch by letting go of the band and lowering your hands. Repeat __________ times. Complete this exercise __________ times a day. Strengthening exercises External rotation  Sit in a stable chair without armrests. Secure an exercise band to a stable object at elbow height on your left / right side. Place a soft object, such as a folded towel or a small pillow, between your left / right upper arm and your body to move your elbow  about 4 inches (10 cm) away from your side. Hold the end of the exercise band so it is tight and there is no slack. Keeping your elbow pressed against the soft object, slowly move your forearm out, away from your abdomen (external rotation). Keep your body steady so only your forearm moves. Hold for __________ seconds. Slowly return to the starting position. Repeat __________ times. Complete this exercise __________ times a day. Shoulder abduction  Sit in a stable chair without armrests, or stand up. Hold a __________ lb / kg weight in your left / right hand, or hold an exercise band with both hands. Start with your arms straight down and your left / right palm facing in, toward your body. Slowly lift your left / right hand out to your side (abduction). Do not lift your hand above shoulder height unless your health care provider tells you that this is safe. Keep your arms straight. Avoid shrugging your shoulder while you do this movement. Keep your shoulder blade tucked down toward the middle of your back. Hold for __________ seconds. Slowly lower your arm, and return to the starting position. Repeat __________ times. Complete this exercise __________  times a day. Shoulder extension  Sit in a stable chair without armrests, or stand up. Secure an exercise band to a stable object in front of you so it is at shoulder height. Hold one end of the exercise band in each hand. Straighten your elbows and lift your hands up to shoulder height. Squeeze your shoulder blades together as you pull your hands down to the sides of your thighs (extension). Stop when your hands are straight down by your sides. Do not let your hands go behind your body. Hold for __________ seconds. Slowly return to the starting position. Repeat __________ times. Complete this exercise __________ times a day. Shoulder row  Sit in a stable chair without armrests, or stand up. Secure an exercise band to a stable object in  front of you so it is at chest height. Hold one end of the exercise band in each hand. Position your palms so that your thumbs are facing the ceiling (neutral position). Bend each of your elbows to a 90-degree angle (right angle) and keep your upper arms at your sides. Step back or move the chair back until the band is tight and there is no slack. Slowly pull your elbows back behind you. Hold for __________ seconds. Slowly return to the starting position. Repeat __________ times. Complete this exercise __________ times a day. Shoulder press-ups  Sit in a stable chair that has armrests. Sit upright, with your feet flat on the floor. Put your hands on the armrests so your elbows are bent and your fingers are pointing forward. Your hands should be about even with the sides of your body. Push down on the armrests and use your arms to lift yourself off the chair. Straighten your elbows and lift yourself up as much as you comfortably can. Move your shoulder blades down, and avoid letting your shoulders move up toward your ears. Keep your feet on the ground. As you get stronger, your feet should support less of your body weight as you lift yourself up. Hold for __________ seconds. Slowly lower yourself back into the chair. Repeat __________ times. Complete this exercise __________ times a day. Wall push-ups  Stand so you are facing a stable wall. Your feet should be about one arm-length away from the wall. Lean forward and place your palms on the wall at shoulder height. Keep your feet flat on the floor as you bend your elbows and lean forward toward the wall. Hold for __________ seconds. Straighten your elbows to push yourself back to the starting position. Repeat __________ times. Complete this exercise __________ times a day. This information is not intended to replace advice given to you by your health care provider. Make sure you discuss any questions you have with your health care  provider. Document Revised: 02/15/2021 Document Reviewed: 02/15/2021 Elsevier Patient Education  2023 [employer]   Neck Exercises Ask your health care provider which exercises are safe for you. Do exercises exactly as told by your health care provider and adjust them as directed. It is normal to feel mild stretching, pulling, tightness, or discomfort as you do these exercises. Stop right away if you feel sudden pain or your pain gets worse. Do not begin these exercises until told by your health care provider. Neck exercises can be important for many reasons. They can improve strength and maintain flexibility in your neck, which will help your upper back and prevent neck pain. Stretching exercises Rotation neck stretching  Sit in a chair or stand up. Place  your feet flat on the floor, shoulder-width apart. Slowly turn your head (rotate) to the right until a slight stretch is felt. Turn it all the way to the right so you can look over your right shoulder. Do not tilt or tip your head. Hold this position for 10-30 seconds. Slowly turn your head (rotate) to the left until a slight stretch is felt. Turn it all the way to the left so you can look over your left shoulder. Do not tilt or tip your head. Hold this position for 10-30 seconds. Repeat __________ times. Complete this exercise __________ times a day. Neck retraction  Sit in a sturdy chair or stand up. Look straight ahead. Do not bend your neck. Use your fingers to push your chin backward (retraction). Do not bend your neck for this movement. Continue to face straight ahead. If you are doing the exercise properly, you will feel a slight sensation in your throat and a stretch at the back of your neck. Hold the stretch for 1-2 seconds. Repeat __________ times. Complete this exercise __________ times a day. Strengthening exercises Neck press  Lie on your back on a firm bed or on the floor with a pillow under your head. Use your neck  muscles to push your head down on the pillow and straighten your spine. Hold the position as well as you can. Keep your head facing up (in a neutral position) and your chin tucked. Slowly count to 5 while holding this position. Repeat __________ times. Complete this exercise __________ times a day. Isometrics These are exercises in which you strengthen the muscles in your neck while keeping your neck still (isometrics). Sit in a supportive chair and place your hand on your forehead. Keep your head and face facing straight ahead. Do not flex or extend your neck while doing isometrics. Push forward with your head and neck while pushing back with your hand. Hold for 10 seconds. Do the sequence again, this time putting your hand against the back of your head. Use your head and neck to push backward against the hand pressure. Finally, do the same exercise on either side of your head, pushing sideways against the pressure of your hand. Repeat __________ times. Complete this exercise __________ times a day. Prone head lifts  Lie face-down (prone position), resting on your elbows so that your chest and upper back are raised. Start with your head facing downward, near your chest. Position your chin either on or near your chest. Slowly lift your head upward. Lift until you are looking straight ahead. Then continue lifting your head as far back as you can comfortably stretch. Hold your head up for 5 seconds. Then slowly lower it to your starting position. Repeat __________ times. Complete this exercise __________ times a day. Supine head lifts  Lie on your back (supine position), bending your knees to point to the ceiling and keeping your feet flat on the floor. Lift your head slowly off the floor, raising your chin toward your chest. Hold for 5 seconds. Repeat __________ times. Complete this exercise __________ times a day. Scapular retraction  Stand with your arms at your sides. Look straight  ahead. Slowly pull both shoulders (scapulae) backward and downward (retraction) until you feel a stretch between your shoulder blades in your upper back. Hold for 10-30 seconds. Relax and repeat. Repeat __________ times. Complete this exercise __________ times a day. Contact a health care provider if: Your neck pain or discomfort gets worse when you do an exercise.  Your neck pain or discomfort does not improve within 2 hours after you exercise. If you have any of these problems, stop exercising right away. Do not do the exercises again unless your health care provider says that you can. Get help right away if: You develop sudden, severe neck pain. If this happens, stop exercising right away. Do not do the exercises again unless your health care provider says that you can. This information is not intended to replace advice given to you by your health care provider. Make sure you discuss any questions you have with your health care provider. Document Revised: 06/22/2020 Document Reviewed: 06/22/2020 Elsevier Patient Education  2023 [employer].

## 2022-05-29 NOTE — Progress Notes (Signed)
Acute Office Visit  Subjective:    Patient ID: Maria Quinn, female    DOB: 09-01-1980, 42 y.o.   MRN: [id number]  Chief Complaint  Patient presents with   Numbness    Pt c/o numbness, tingling and pain in left hand and some tingling from shoulder down to hand.    HPI Patient is in today for Left arm pain, numbness and tingling. Onset 2weeks ago, insidious onset but now constant, symptoms worse at night. She denies any arm or neck injury. No previous surgery. She is left hand dominant.  Outpatient Medications Prior to Visit  Medication Sig   amLODipine (NORVASC) 5 MG tablet Take 1 tablet (5 mg total) by mouth daily.   Ascorbic Acid (VITAMIN C PO) Take 1,000 mg by mouth daily.   blood glucose meter kit and supplies Dispense based on patient and insurance preference. Use up to two times daily as directed. (FOR ICD-10 E10.9, E11.9). 1 year supplies   chlorthalidone (HYGROTON) 25 MG tablet Take 1 tablet (25 mg total) by mouth daily.   Cholecalciferol (VITAMIN D) 50 MCG (2000 UT) CAPS Take by mouth daily.   Dulaglutide (TRULICITY) 1.5 MG/0.5ML SOPN Inject 1.5 mg into the skin once a week.   metFORMIN (GLUCOPHAGE) 500 MG tablet Take 1 tablet (500 mg total) by mouth daily with breakfast.   Multiple Vitamins-Minerals (CENTRUM WOMEN PO) Take by mouth.   spironolactone (ALDACTONE) 25 MG tablet Take 1 tablet (25 mg total) by mouth daily.   No facility-administered medications prior to visit.   Reviewed past medical and social history.  Review of Systems Per HPI     Objective:    Physical Exam Vitals and nursing note reviewed.  Constitutional:      General: She is not in acute distress. Musculoskeletal:     Right shoulder: Normal.     Left shoulder: Tenderness present. No swelling, deformity, effusion, laceration, bony tenderness or crepitus. Normal range of motion. Decreased strength. Normal pulse.     Right upper arm: Normal.     Left upper arm: Normal.     Right elbow: Normal.      Left elbow: Normal.     Right forearm: Normal.     Left forearm: Normal.     Right wrist: Normal.     Left wrist: Normal.     Right hand: Normal.     Left hand: No swelling, deformity, lacerations or tenderness. Normal range of motion. Decreased strength of finger abduction and thumb/finger opposition. Normal sensation. There is no disruption of two-point discrimination. Normal capillary refill. Normal pulse.     Cervical back: Normal.     Comments: Anterior shoulder pain  Neurological:     Mental Status: She is alert.    BP 122/80 (BP Location: Right Arm, Patient Position: Sitting, Cuff Size: Large)   Pulse 77   Temp 98.3 F (36.8 C) (Temporal)   Resp 16   Ht 5\' 5"  (1.651 m)   Wt 293 lb (132.9 kg)   LMP 10/19/2010 Comment: No periods   SpO2 97%   BMI 48.76 kg/m    No results found for any visits on 05/29/22.     Assessment & Plan:   Problem List Items Addressed This Visit   None Visit Diagnoses     Acute pain of left shoulder    -  Primary   Relevant Medications   predniSONE (DELTASONE) 20 MG tablet   tiZANidine (ZANAFLEX) 4 MG tablet   Other Relevant Orders  DG Cervical Spine Complete   Cervical radiculopathy       Relevant Medications   predniSONE (DELTASONE) 20 MG tablet   tiZANidine (ZANAFLEX) 4 MG tablet   Other Relevant Orders   DG Cervical Spine Complete      Meds ordered this encounter  Medications   predniSONE (DELTASONE) 20 MG tablet    Sig: Take 2 tablets (40 mg total) by mouth daily with breakfast for 2 days, THEN 1.5 tablets (30 mg total) daily with breakfast for 2 days, THEN 1 tablet (20 mg total) daily with breakfast for 2 days, THEN 0.5 tablets (10 mg total) daily with breakfast for 2 days.    Dispense:  10 tablet    Refill:  0    Order Specific Question:   Supervising Provider    Answer:   Nadene Rubins ALFRED [[id number]]   tiZANidine (ZANAFLEX) 4 MG tablet    Sig: Take 1 tablet (4 mg total) by mouth every 8 (eight) hours as needed for  muscle spasms.    Dispense:  14 tablet    Refill:  0    Order Specific Question:   Supervising Provider    Answer:   [name] Sax [[id number]]   Return if symptoms worsen or fail to improve.  Alysia Penna, NP

## 2022-06-04 MED ORDER — CYCLOBENZAPRINE HCL 5 MG PO TABS
5.0000 mg | ORAL_TABLET | Freq: Three times a day (TID) | ORAL | 0 refills | Status: DC | PRN
Start: 2022-06-04 — End: 2023-07-26

## 2022-06-04 NOTE — Addendum Note (Signed)
Addended by: Alysia Penna L on: 06/04/2022 11:45 PM   Modules accepted: Orders

## 2022-06-08 NOTE — Telephone Encounter (Signed)
-----   Message from Olga Coaster, [place] sent at 06/02/2022  2:34 PM EDT ----- Maria Quinn called for her X-ray results and she was made aware that you will return to the office Tuesday 5/28.  She said the muscle relaxer and Prednisone is not helping. She still has numbness and tingling.

## 2022-06-09 ENCOUNTER — Telehealth: Payer: Self-pay | Admitting: Nurse Practitioner

## 2022-06-09 DIAGNOSIS — M5412 Radiculopathy, cervical region: Secondary | ICD-10-CM

## 2022-06-09 DIAGNOSIS — M5030 Other cervical disc degeneration, unspecified cervical region: Secondary | ICD-10-CM

## 2022-06-09 NOTE — Telephone Encounter (Signed)
-----   Message from Olga Coaster, [place] sent at 06/09/2022 10:36 AM EDT ----- Regarding: MRI Are you able to change location?  I didn't see where I can change the location only.   Claris Gower placed a STAT MRI for [id number]   Estill Batten is not In network with the insurance  can you update the order for [place]. If approved after they review clinicals we can get her scheduled internally

## 2022-06-26 ENCOUNTER — Ambulatory Visit: Payer: 59 | Admitting: Neurology

## 2022-07-24 ENCOUNTER — Ambulatory Visit: Payer: 59 | Admitting: Nurse Practitioner

## 2022-07-24 ENCOUNTER — Other Ambulatory Visit (HOSPITAL_COMMUNITY): Payer: Self-pay

## 2022-07-24 ENCOUNTER — Telehealth: Payer: Self-pay | Admitting: Nurse Practitioner

## 2022-07-24 ENCOUNTER — Telehealth: Payer: Self-pay

## 2022-07-24 NOTE — Telephone Encounter (Signed)
.  opsPharmacy Patient Advocate Encounter   Received notification from Physician's Office that prior authorization for Trulicity 1.5mg /0.73ml is required/requested.   Insurance verification completed.   The patient is insured through  [employer]  .   Per test claim: prior authorization required  PA submitted to [facility] via Fax  Status is pending

## 2022-07-24 NOTE — Telephone Encounter (Signed)
Pt called back and states pharmacy told her last RX for Trulicity was filed to [facility] but they are denying due to needing prior auth.   Notes in system shows last auth from [place].    Copied from tel enc 05/18/2022... PA approved. this request is approved from 05/19/2022 to 05/19/2023

## 2022-07-24 NOTE — Telephone Encounter (Signed)
I called pt re: rescheduling appt today due to provider out unexpectedly. Pt noted she tried to fill Trulicity last week and was told insurance is requiring another prior [place]. Pt states there was a mix up with her insurance in April. She isn't sure if we got [place] from [place] or [place]. Pt to call pharmacy and see which insurance was billed in April. If it was Monia Pouch we will need new prior auth for [facility] on her Trulicity. Pt to call us back.

## 2022-07-26 ENCOUNTER — Other Ambulatory Visit (HOSPITAL_COMMUNITY): Payer: Self-pay

## 2022-07-27 NOTE — Telephone Encounter (Signed)
Pharmacy Patient Advocate Encounter  Received notification from  [employer]  that Prior Authorization for Trulicity 1.5mg /0.40m  has been APPROVED from 07/26/22 to 07/26/23.Marland Kitchen  PA #/Case ID/Reference #: [id number]

## 2022-08-04 ENCOUNTER — Ambulatory Visit: Payer: 59 | Admitting: Nurse Practitioner

## 2022-08-04 ENCOUNTER — Encounter: Payer: Self-pay | Admitting: Nurse Practitioner

## 2022-08-04 VITALS — BP 120/84 | HR 81 | Temp 98.3°F | Resp 16 | Ht 65.0 in | Wt 295.8 lb

## 2022-08-04 DIAGNOSIS — M542 Cervicalgia: Secondary | ICD-10-CM | POA: Diagnosis not present

## 2022-08-04 DIAGNOSIS — M25519 Pain in unspecified shoulder: Secondary | ICD-10-CM

## 2022-08-04 MED ORDER — MELOXICAM 7.5 MG PO TABS
7.5000 mg | ORAL_TABLET | Freq: Every day | ORAL | 0 refills | Status: DC
Start: 2022-08-04 — End: 2022-10-31

## 2022-08-04 NOTE — Patient Instructions (Addendum)
Shoulder pain due to overuse and athritis Start mobic for pain  You will be contacted to schedule appointment with PT. Contact [facility] for appointment [phone].  Shoulder Pain Many things can cause shoulder pain, including: An injury to the shoulder. Overuse of the shoulder. Arthritis. The source of the pain can be: Inflammation. An injury to the shoulder joint. An injury to a tendon, ligament, or bone. Follow these instructions at home: Pay attention to changes in your symptoms. Let your health care provider know about them. Follow these instructions to relieve your pain. If you have a removable sling: Wear the sling as told by your provider. Remove it only as told by your provider. Check the skin around the sling every day. Tell your provider about any concerns. Loosen the sling if your fingers tingle, become numb, or become cold. Keep the sling clean. If the sling is not waterproof: Do not let it get wet. Remove it to shower or bathe. Move your arm as little as possible, but keep your hand moving to prevent swelling. Managing pain, stiffness, and swelling  If told, put ice on the painful area. If you have a removable sling or immobilizer, remove it as told by your provider. Put ice in a plastic bag. Place a towel between your skin and the bag. Leave the ice on for 20 minutes, 2-3 times a day. If your skin turns bright red, remove the ice right away to prevent skin damage. The risk of damage is higher if you cannot feel pain, heat, or cold. Move your fingers often to reduce stiffness and swelling. Squeeze a soft ball or a foam pad as much as possible. This helps to keep the shoulder from swelling. It also helps to strengthen the arm. General instructions Take over-the-counter and prescription medicines only as told by your provider. Exercise may help with pain management. Perform exercises if told by your provider. You may be referred to a physical  therapist to help in your recovery process. Keep all follow-up visits in order to avoid any type of permanent shoulder disability or chronic pain problems. Contact a health care provider if: Your pain is not relieved with medicines. New pain develops in your arm, hand, or fingers. You loosen your sling and your arm, hand, or fingers remain tingly, numb, swollen, or painful. Get help right away if: Your arm, hand, or fingers turn white or blue. This information is not intended to replace advice given to you by your health care provider. Make sure you discuss any questions you have with your health care provider. Document Revised: 07/29/2021 Document Reviewed: 07/29/2021 Elsevier Patient Education  2024 [employer].

## 2022-08-04 NOTE — Assessment & Plan Note (Addendum)
Left , Subacute, onset 2months ago, anterior shoulder pain, secondary to overuse Improved with oral prednisone and muscle relaxant, but pain worsened in last 2days. Associate with tingling in left upper arm. Left hand dorminant.  Sent mobic 7.5mg  Entered referral to PT Ref to ortho if no improvement

## 2022-08-04 NOTE — Progress Notes (Signed)
Established Patient Visit  Patient: Maria Quinn   DOB: 29-Apr-1980   42 y.o. Female  MRN: [id number] Visit Date: 08/04/2022  Subjective:    Chief Complaint  Patient presents with   Follow-up    Neck to shoulder pain.  Hasn't been bothering her until today    HPI Anterior shoulder pain Left , Subacute, onset 2months ago, anterior shoulder pain, secondary to overuse Improved with oral prednisone and muscle relaxant, but pain worsened in last 2days. Associate with tingling in left upper arm. Left hand dorminant.  Sent mobic 7.5mg  Entered referral to PT Ref to ortho if no improvement  Reviewed medical, surgical, and social history today  Medications: Outpatient Medications Prior to Visit  Medication Sig   amLODipine (NORVASC) 5 MG tablet Take 1 tablet (5 mg total) by mouth daily.   Ascorbic Acid (VITAMIN C PO) Take 1,000 mg by mouth daily.   blood glucose meter kit and supplies Dispense based on patient and insurance preference. Use up to two times daily as directed. (FOR ICD-10 E10.9, E11.9). 1 year supplies   chlorthalidone (HYGROTON) 25 MG tablet Take 1 tablet (25 mg total) by mouth daily.   Cholecalciferol (VITAMIN D) 50 MCG (2000 UT) CAPS Take by mouth daily.   cyclobenzaprine (FLEXERIL) 5 MG tablet Take 1 tablet (5 mg total) by mouth 3 (three) times daily as needed for muscle spasms.   Dulaglutide (TRULICITY) 1.5 MG/0.5ML SOPN Inject 1.5 mg into the skin once a week.   metFORMIN (GLUCOPHAGE) 500 MG tablet Take 1 tablet (500 mg total) by mouth daily with breakfast.   Multiple Vitamins-Minerals (CENTRUM WOMEN PO) Take by mouth.   spironolactone (ALDACTONE) 25 MG tablet Take 1 tablet (25 mg total) by mouth daily.   No facility-administered medications prior to visit.   Reviewed past medical and social history.   ROS per HPI above      Objective:  BP 120/84 (BP Location: Left Arm, Patient Position: Sitting, Cuff Size: Large)   Pulse 81   Temp 98.3 F  (36.8 C) (Temporal)   Resp 16   Ht 5\' 5"  (1.651 m)   Wt 295 lb 12.8 oz (134.2 kg)   LMP 10/19/2010 Comment: No periods   SpO2 95%   BMI 49.22 kg/m      Physical Exam Vitals and nursing note reviewed.  Musculoskeletal:     Right shoulder: Normal.     Left shoulder: Tenderness and crepitus present. No swelling, deformity, effusion, laceration or bony tenderness. Normal range of motion. Decreased strength. Normal pulse.     Right upper arm: Normal.     Left upper arm: Normal.     Right elbow: Normal.     Left elbow: Normal.     Right forearm: Normal.     Left forearm: Normal.     Right wrist: Normal.     Left wrist: Normal.     Cervical back: Full passive range of motion without pain.  Lymphadenopathy:     Cervical: No cervical adenopathy.  Neurological:     Mental Status: She is oriented to person, place, and time.     No results found for any visits on 08/04/22.    Assessment & Plan:    Problem List Items Addressed This Visit       Other   Anterior shoulder pain    Left , Subacute, onset 2months ago, anterior shoulder pain, secondary to overuse  Improved with oral prednisone and muscle relaxant, but pain worsened in last 2days. Associate with tingling in left upper arm. Left hand dorminant.  Sent mobic 7.5mg  Entered referral to PT Ref to ortho if no improvement       Relevant Medications   meloxicam (MOBIC) 7.5 MG tablet   Other Relevant Orders   Ambulatory referral to Physical Therapy   Cervicalgia - Primary   Relevant Medications   meloxicam (MOBIC) 7.5 MG tablet   Other Relevant Orders   Ambulatory referral to Physical Therapy   Return in about 3 months (around 11/04/2022), or if symptoms worsen or fail to improve.     Alysia Penna, NP

## 2022-09-22 ENCOUNTER — Ambulatory Visit: Payer: 59

## 2022-09-28 ENCOUNTER — Telehealth: Payer: Self-pay | Admitting: Nurse Practitioner

## 2022-09-28 NOTE — Telephone Encounter (Signed)
I called and spoke with patient and she said that she has been back and forth with insurance company and [address]. She has paid $30.00 for her Rx back in July and now it is ringing up at $912.00. She said that it does not need prior authorization

## 2022-09-28 NOTE — Telephone Encounter (Signed)
Dawanda [phone]    Pt stated that she is having trouble getting her med refilled. She has spoken to her pharmacy and to her insurance and they have told her to call us.   Dulaglutide (TRULICITY) 1.5 MG/0.5ML [facility] [[id number]]    [facility] [address] [place] - [address] [place] [address] Phone: [phone]  Fax: [phone]

## 2022-10-02 ENCOUNTER — Telehealth: Payer: Self-pay

## 2022-10-02 DIAGNOSIS — M542 Cervicalgia: Secondary | ICD-10-CM

## 2022-10-02 DIAGNOSIS — M25519 Pain in unspecified shoulder: Secondary | ICD-10-CM

## 2022-10-02 NOTE — Progress Notes (Signed)
10/02/2022  Patient ID: Maria Quinn, female   DOB: 07-16-1980, 42 y.o.   MRN: [id number]  Clinic routed request from patient's PCP, Alysia Penna, NP, stating patient was having an issue with insurance not wanting to pay for Trulicity 1.5mg .  Patient was previously getting this prescription filled for $30/month.  Upon contacting insurance, it appears the pharmacy was billing the wrong insurance (former) plan.  Medication had also been transferred to another [facility] pharmacy at the patient's request, so I contacted them.  Pharmacy is processing current insurance, and the Trulicity is going through again at $30 copay.  Contacted patient to make her aware.  Lenna Gilford, PharmD, DPLA

## 2022-10-06 NOTE — Addendum Note (Signed)
Addended by: Alysia Penna L on: 10/06/2022 02:40 PM   Modules accepted: Orders

## 2022-10-25 ENCOUNTER — Telehealth: Payer: Self-pay | Admitting: Nurse Practitioner

## 2022-10-25 ENCOUNTER — Ambulatory Visit: Payer: 59 | Admitting: Nurse Practitioner

## 2022-10-25 NOTE — Telephone Encounter (Signed)
10/25/22 - Pt walked in at 8:14 am for her 8:00 am appt. She had to re-sch. No show letter sent.

## 2022-10-25 NOTE — Telephone Encounter (Signed)
late arrival, 1st missed visit

## 2022-10-26 ENCOUNTER — Encounter: Payer: Self-pay | Admitting: Nurse Practitioner

## 2022-10-26 ENCOUNTER — Ambulatory Visit: Payer: 59 | Admitting: Nurse Practitioner

## 2022-10-26 VITALS — BP 122/88 | HR 64 | Temp 98.2°F | Ht 65.0 in | Wt 288.8 lb

## 2022-10-26 DIAGNOSIS — E7800 Pure hypercholesterolemia, unspecified: Secondary | ICD-10-CM

## 2022-10-26 DIAGNOSIS — M542 Cervicalgia: Secondary | ICD-10-CM

## 2022-10-26 DIAGNOSIS — E1165 Type 2 diabetes mellitus with hyperglycemia: Secondary | ICD-10-CM

## 2022-10-26 DIAGNOSIS — I10 Essential (primary) hypertension: Secondary | ICD-10-CM

## 2022-10-26 DIAGNOSIS — N289 Disorder of kidney and ureter, unspecified: Secondary | ICD-10-CM

## 2022-10-26 LAB — COMPREHENSIVE METABOLIC PANEL
ALT: 22 U/L (ref 0–35)
AST: 25 U/L (ref 0–37)
Albumin: 4.4 g/dL (ref 3.5–5.2)
Alkaline Phosphatase: 93 U/L (ref 39–117)
BUN: 18 mg/dL (ref 6–23)
CO2: 30 meq/L (ref 19–32)
Calcium: 9.4 mg/dL (ref 8.4–10.5)
Chloride: 102 meq/L (ref 96–112)
Creatinine, Ser: 1.33 mg/dL — ABNORMAL HIGH (ref 0.40–1.20)
GFR: 48.91 mL/min — ABNORMAL LOW (ref >60.00)
Glucose, Bld: 97 mg/dL (ref 70–99)
Potassium: 4.4 meq/L (ref 3.5–5.1)
Sodium: 138 meq/L (ref 135–145)
Total Bilirubin: 1.2 mg/dL (ref 0.2–1.2)
Total Protein: 7.2 g/dL (ref 6.0–8.3)

## 2022-10-26 LAB — HEMOGLOBIN A1C: Hgb A1c MFr Bld: 6.2 % (ref 4.6–6.5)

## 2022-10-26 LAB — LDL CHOLESTEROL, DIRECT: Direct LDL: 46 mg/dL

## 2022-10-26 NOTE — Patient Instructions (Addendum)
Schedule appointment with Maria Quinn neurosurgery [phone]) and outpatient PT at [facility] [phone]). Go to lab Maintain current meds

## 2022-10-26 NOTE — Assessment & Plan Note (Addendum)
X-ray cervical spine: Mild degenerative changes in the cervical spine at C3-C4. Reports some improvement with mobic, muscle relaxant, and oral prednisone. Associate with tingling in left upper arm. Left hand dorminant.  Advised to schedule appointment with outpatient PT and spine specialist. Maintain mobic dose

## 2022-10-26 NOTE — Addendum Note (Signed)
Addended by: Alysia Penna L on: 10/26/2022 03:59 PM   Modules accepted: Orders

## 2022-10-26 NOTE — Progress Notes (Signed)
Established Patient Visit  Patient: Maria Quinn   DOB: 11-09-1980   42 y.o. Female  MRN: [id number] Visit Date: 10/26/2022  Subjective:    Chief Complaint  Patient presents with   Follow-up    Referral    HPI DM (diabetes mellitus) (HCC) Advised to schedule DIABETES eye exam Unable to complete eye exam with mobile clinic today No home glucose readings Denies any adverse effects with Trulicity and Metformin  Repeat CMP, HgbA1c, and LDL today Maintain med doses F/up in 3-2months  Essential hypertension BP controlled with amlodipine, spirolactone and chlorthialidone BP Readings from Last 3 Encounters:  10/26/22 122/88  08/04/22 120/84  05/29/22 122/80    Repeat CMP Maintain med doses F/up in 3-57months  Cervicalgia X-ray cervical spine: Mild degenerative changes in the cervical spine at C3-C4. Reports some improvement with mobic, muscle relaxant, and oral prednisone. Associate with tingling in left upper arm. Left hand dorminant.  Advised to schedule appointment with outpatient PT and spine specialist. Maintain mobic dose  Needs PAP-declined, advised to schedule with GYN  Reviewed medical, surgical, and social history today  Medications: Outpatient Medications Prior to Visit  Medication Sig   amLODipine (NORVASC) 5 MG tablet Take 1 tablet (5 mg total) by mouth daily.   Ascorbic Acid (VITAMIN C PO) Take 1,000 mg by mouth daily.   blood glucose meter kit and supplies Dispense based on patient and insurance preference. Use up to two times daily as directed. (FOR ICD-10 E10.9, E11.9). 1 year supplies   chlorthalidone (HYGROTON) 25 MG tablet Take 1 tablet (25 mg total) by mouth daily.   Cholecalciferol (VITAMIN D) 50 MCG (2000 UT) CAPS Take by mouth daily.   cyclobenzaprine (FLEXERIL) 5 MG tablet Take 1 tablet (5 mg total) by mouth 3 (three) times daily as needed for muscle spasms.   Dulaglutide (TRULICITY) 1.5 MG/0.5ML SOPN Inject 1.5 mg into the skin  once a week.   meloxicam (MOBIC) 7.5 MG tablet Take 1 tablet (7.5 mg total) by mouth daily. With food   metFORMIN (GLUCOPHAGE) 500 MG tablet Take 1 tablet (500 mg total) by mouth daily with breakfast.   Multiple Vitamins-Minerals (CENTRUM WOMEN PO) Take by mouth.   spironolactone (ALDACTONE) 25 MG tablet Take 1 tablet (25 mg total) by mouth daily.   No facility-administered medications prior to visit.   Reviewed past medical and social history.   ROS per HPI above      Objective:  BP 122/88   Pulse 64   Temp 98.2 F (36.8 C) (Temporal)   Ht 5\' 5"  (1.651 m)   Wt 288 lb 12.8 oz (131 kg)   LMP 10/19/2010 Comment: No periods   SpO2 95%   BMI 48.06 kg/m      Physical Exam Cardiovascular:     Rate and Rhythm: Normal rate and regular rhythm.     Pulses: Normal pulses.     Heart sounds: Normal heart sounds.  Pulmonary:     Effort: Pulmonary effort is normal.     Breath sounds: Normal breath sounds.  Musculoskeletal:     Right lower leg: No edema.     Left lower leg: No edema.  Neurological:     Mental Status: She is alert and oriented to person, place, and time.     No results found for any visits on 10/26/22.    Assessment & Plan:    Problem List Items Addressed This  Visit     Cervicalgia    X-ray cervical spine: Mild degenerative changes in the cervical spine at C3-C4. Reports some improvement with mobic, muscle relaxant, and oral prednisone. Associate with tingling in left upper arm. Left hand dorminant.  Advised to schedule appointment with outpatient PT and spine specialist. Maintain mobic dose      DM (diabetes mellitus) (HCC)    Advised to schedule DIABETES eye exam Unable to complete eye exam with mobile clinic today No home glucose readings Denies any adverse effects with Trulicity and Metformin  Repeat CMP, HgbA1c, and LDL today Maintain med doses F/up in 3-64months      Relevant Orders   Hemoglobin A1c   Comprehensive metabolic panel    Essential hypertension - Primary    BP controlled with amlodipine, spirolactone and chlorthialidone BP Readings from Last 3 Encounters:  10/26/22 122/88  08/04/22 120/84  05/29/22 122/80    Repeat CMP Maintain med doses F/up in 3-89months      Relevant Orders   Comprehensive metabolic panel   Other Visit Diagnoses     Elevated LDL cholesterol level       Relevant Orders   Direct LDL      Return in about 6 months (around 04/26/2023) for HTN, DM, hyperlipidemia (fasting).     Alysia Penna, NP

## 2022-10-26 NOTE — Assessment & Plan Note (Addendum)
BP controlled with amlodipine, spirolactone and chlorthialidone BP Readings from Last 3 Encounters:  10/26/22 122/88  08/04/22 120/84  05/29/22 122/80    Repeat CMP: acute renal insufficiency Hold chlorthialidone Repeat renal function in 66month Advised to monitor BP at home daily in AM, and call office if BP>140/80 F/up in 3-42months

## 2022-10-26 NOTE — Assessment & Plan Note (Addendum)
Advised to schedule DIABETES eye exam Unable to complete eye exam with mobile clinic today No home glucose readings Denies any adverse effects with Trulicity and Metformin  Repeat CMP, HgbA1c, and LDL today Maintain med doses F/up in 3-33months

## 2022-10-30 ENCOUNTER — Other Ambulatory Visit: Payer: Self-pay | Admitting: Nurse Practitioner

## 2022-10-30 DIAGNOSIS — M542 Cervicalgia: Secondary | ICD-10-CM

## 2022-10-30 DIAGNOSIS — M25519 Pain in unspecified shoulder: Secondary | ICD-10-CM

## 2022-11-03 ENCOUNTER — Telehealth: Payer: Self-pay | Admitting: Nurse Practitioner

## 2022-11-03 NOTE — Telephone Encounter (Signed)
Pt would like for you to give her a call in reference to her lab results from her last visit

## 2022-11-14 ENCOUNTER — Telehealth: Payer: Self-pay | Admitting: Nurse Practitioner

## 2022-11-14 NOTE — Telephone Encounter (Signed)
Pt called and said she heard [facility] is no longer accepting her insurance [employer] ) pt want to know what insurance [facility] accept so she can try to switch it to what she accept for early enrollment

## 2022-11-16 NOTE — Telephone Encounter (Signed)
Pt says she read on her mychart, she is having declining renal functions. She is very worried. Please advise at   [phone]

## 2022-11-16 NOTE — Telephone Encounter (Signed)
Spoke to PT and went over questions and concerns. PT was scheduled for lab visit at 11:00 am on 11/17/2022 (fasting) to repeat renal function. Patient verbalized understanding.

## 2022-11-16 NOTE — Telephone Encounter (Signed)
I spoke with pt, she Is going to send a screen shot of the letter she got from [place], stating Claris Gower is no longer taking [employer].

## 2022-11-17 ENCOUNTER — Other Ambulatory Visit (INDEPENDENT_AMBULATORY_CARE_PROVIDER_SITE_OTHER): Payer: 59

## 2022-11-17 DIAGNOSIS — I10 Essential (primary) hypertension: Secondary | ICD-10-CM

## 2022-11-17 DIAGNOSIS — N289 Disorder of kidney and ureter, unspecified: Secondary | ICD-10-CM

## 2022-11-17 LAB — RENAL FUNCTION PANEL
Albumin: 4 g/dL (ref 3.5–5.2)
BUN: 16 mg/dL (ref 6–23)
CO2: 29 meq/L (ref 19–32)
Calcium: 8.8 mg/dL (ref 8.4–10.5)
Chloride: 106 meq/L (ref 96–112)
Creatinine, Ser: 0.78 mg/dL (ref 0.40–1.20)
GFR: 92.75 mL/min (ref >60.00)
Glucose, Bld: 89 mg/dL (ref 70–99)
Phosphorus: 4.1 mg/dL (ref 2.3–4.6)
Potassium: 4 meq/L (ref 3.5–5.1)
Sodium: 142 meq/L (ref 135–145)

## 2022-11-18 ENCOUNTER — Encounter: Payer: Self-pay | Admitting: Nurse Practitioner

## 2022-11-20 NOTE — Telephone Encounter (Signed)
Spoke to patient and she states that she stopped taking all of her BP meds.  She was trying to ask about should she continue to not take them all.  She was advised only to hold the Chlorthalidone.   She got upset stating she didn't like the way the conversation was going, and asked if she could get an appointment with Claris Gower.  While getting into that screen patient started to get louder with me, advised that I was trying to get into the scheduling screen and then I scheduled her for 11/22/22 @ 10:00 am.  Dm/cma

## 2022-11-22 ENCOUNTER — Ambulatory Visit: Payer: 59 | Admitting: Nurse Practitioner

## 2022-12-27 LAB — HM DIABETES EYE EXAM

## 2023-01-04 ENCOUNTER — Ambulatory Visit: Payer: Self-pay | Admitting: Nurse Practitioner

## 2023-01-04 NOTE — Telephone Encounter (Signed)
  Chief Complaint: pain and weakness in knees and left lower leg  Symptoms: swelling, pain in knees, left leg pain and weakness with walking  Frequency: x 2 days Pertinent Negatives: Patient denies chest pain, SOB, fever, rash, cold/pale/blue extremities, hx of dvt  Disposition: [] ED /[] Urgent Care (no appt availability in office) / [x] Appointment(In office/virtual)/ []  [facility]/ [] Home Care/ [] Refused Recommended Disposition /[] [facility]/ []  Follow-up with PCP Additional Notes: Pt c/o pain, swelling in knees and pain and weakness in left leg. Pt states she has a hx of arthritis in her knees and is a Horticulturist, commercial; increased exercise and dancing during the past 2 months. Pt states she has to "shake out the left leg before putting it down to stop the pain and weakness".  Pt states the weakness is after 4-5 steps her left knee feels like it is giving out for her leg. Pt denies any known injury. Pt states she is taking Tylenol PM which alleviates the pain and she is able to sleep. Pt states she has not been treating the pain during the day. Pt states she is out of town today, unable to make any sooner appt. Pt scheduled and verbalizes understanding of home care advice and concerning symptoms to call back.    Copied from CRM [id number]. Topic: Clinical - Red Word Triage >> Jan 04, 2023 10:53 AM Corin V wrote: Red Word that prompted transfer to Nurse Triage: Issue with both legs- weakness and shooting pain below knees. Reason for Disposition  Numbness in a leg or foot (i.e., loss of sensation)  Answer Assessment - Initial Assessment Questions 1. ONSET: "When did the pain start?"      2 days ago.  2. LOCATION: "Where is the pain located?"      Both knees and left lower leg.  3. PAIN: "How bad is the pain?"    (Scale 1-10; or mild, moderate, severe)   -  MILD (1-3): doesn't interfere with normal activities    -  MODERATE (4-7): interferes with normal activities (e.g., work or  school) or awakens from sleep, limping    -  SEVERE (8-10): excruciating pain, unable to do any normal activities, unable to walk     11/10.  4. 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 Dancing and exercise.  5. CAUSE: "What do you think is causing the leg pain?"     Pt states known hx of arthritis in knees. Pt states she has seen an orthopedist in the past but has not seen the MD in a while.  6. OTHER SYMPTOMS: "Do you have any other symptoms?" (e.g., chest pain, back pain, breathing difficulty, swelling, rash, fever, numbness, weakness)     Numbness sometimes in feet, pt states she is a diabetic and is baseline for her. Back pain in the morning when waking up. Large bruise on right knee, pt denies any injury to the limb.  7. PREGNANCY: "Is there any chance you are pregnant?" "When was your last menstrual period?"     Hysterectomy.  Protocols used: Leg Pain-A-AH

## 2023-01-05 ENCOUNTER — Ambulatory Visit: Payer: 59 | Admitting: Family Medicine

## 2023-01-05 ENCOUNTER — Encounter: Payer: Self-pay | Admitting: Family Medicine

## 2023-01-05 VITALS — BP 156/110 | HR 77 | Temp 98.7°F | Ht 65.0 in | Wt 310.4 lb

## 2023-01-05 DIAGNOSIS — M170 Bilateral primary osteoarthritis of knee: Secondary | ICD-10-CM

## 2023-01-05 DIAGNOSIS — I10 Essential (primary) hypertension: Secondary | ICD-10-CM | POA: Diagnosis not present

## 2023-01-05 MED ORDER — AMLODIPINE BESYLATE 10 MG PO TABS
10.0000 mg | ORAL_TABLET | Freq: Every day | ORAL | 1 refills | Status: DC
Start: 2023-01-05 — End: 2023-02-07

## 2023-01-05 MED ORDER — DICLOFENAC SODIUM 1 % EX GEL: CUTANEOUS | 1 refills | Status: DC

## 2023-01-05 MED ORDER — TRAMADOL HCL 50 MG PO TABS: 50.0000 mg | ORAL_TABLET | Freq: Two times a day (BID) | ORAL | 0 refills | Status: AC | PRN

## 2023-01-05 NOTE — Telephone Encounter (Signed)
Spoke with pt during her OV with Dr. Doreene Burke on 01/05/23 regarding her concerns. Pt was concerned about previously reported decline in kidney failure and sent a [facility] message asking about continuing BP medications, which she wanted to speak with Claris Gower about. She was not please with with conversation she had on the phone with Angelique Blonder, CMA on 11/20/22. Pt thought she would have to interact with Angelique Blonder during her OV scheduled for 11/22/22. So she called and cancelled on 11/21/22. Advised the pt in the future, do not cancel a appointment with her provider to for this reason, we are here for her and we want to ensure she receives the care she needs. Advised pt to ask to speak with a supervisor if she feels her concerns are not being heard and need to be escalated. Pt states she understands and thanked me for taking the time to come in and speak with her.

## 2023-01-05 NOTE — Progress Notes (Signed)
Established Patient Office Visit   Subjective:  Patient ID: Maria Quinn, female    DOB: November 19, 1980  Age: 42 y.o. MRN: [id number]  Chief Complaint  Patient presents with   Leg Swelling    Bilateral knee pain left over right x 3 days. Pt also complains of edema weakness and tingling. Numbness in hands but pt states she has already had the numbness in her hands.     HPI Encounter Diagnoses  Name Primary?   Essential hypertension Yes   Primary osteoarthritis of both knees    For follow-up of hypertension and left greater than right knee pain.  She had discontinued Aldactone after a decline in her GFR and has not restarted it after GFR returned to normal.  Presents with bilateral chronic knee pain.  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   Review of Systems  Constitutional: Negative.   HENT: Negative.    Eyes:  Negative for blurred vision, discharge and redness.  Respiratory: Negative.    Cardiovascular: Negative.   Gastrointestinal:  Negative for abdominal pain.  Genitourinary: Negative.   Musculoskeletal:  Positive for joint pain. Negative for myalgias.  Skin:  Negative for rash.  Neurological:  Negative for tingling, loss of consciousness and weakness.  Endo/Heme/Allergies:  Negative for polydipsia.     Current Outpatient Medications:    amLODipine (NORVASC) 10 MG tablet, Take 1 tablet (10 mg total) by mouth daily., Disp: 90 tablet, Rfl: 1   Ascorbic Acid (VITAMIN C PO), Take 1,000 mg by mouth daily., Disp: , Rfl:    blood glucose meter kit and supplies, Dispense based on patient and insurance preference. Use up to two times daily as directed. (FOR ICD-10 E10.9, E11.9). 1 year supplies, Disp: 1 each, Rfl: 0   Cholecalciferol (VITAMIN D) 50 MCG (2000 UT) CAPS, Take by mouth daily., Disp: , Rfl:    cyclobenzaprine (FLEXERIL) 5 MG tablet, Take 1 tablet (5 mg total) by mouth 3 (three) times daily as needed for muscle spasms., Disp:  30 tablet, Rfl: 0   diclofenac Sodium (VOLTAREN ARTHRITIS PAIN) 1 % GEL, Apply a small grape sized dollop to tender area of knees 3-4 times daily, Disp: 150 g, Rfl: 1   Dulaglutide (TRULICITY) 1.5 MG/0.5ML SOPN, Inject 1.5 mg into the skin once a week., Disp: 6 mL, Rfl: 1   metFORMIN (GLUCOPHAGE) 500 MG tablet, Take 1 tablet (500 mg total) by mouth daily with breakfast., Disp: 90 tablet, Rfl: 1   Multiple Vitamins-Minerals (CENTRUM WOMEN PO), Take by mouth., Disp: , Rfl:    traMADol (ULTRAM) 50 MG tablet, Take 1 tablet (50 mg total) by mouth every 12 (twelve) hours as needed for up to 8 days., Disp: 15 tablet, Rfl: 0   spironolactone (ALDACTONE) 25 MG tablet, Take 1 tablet (25 mg total) by mouth daily. (Patient not taking: Reported on 01/05/2023), Disp: 90 tablet, Rfl: 1   Objective:     BP (!) 156/110   Pulse 77   Temp 98.7 F (37.1 C)   Ht 5\' 5"  (1.651 m)   Wt (!) 310 lb 6.4 oz (140.8 kg)   LMP 10/19/2010 Comment: No periods   BMI 51.65 kg/m    Physical Exam Constitutional:      General: She is not in acute distress.    Appearance: Normal appearance. She is not ill-appearing, toxic-appearing or diaphoretic.  HENT:     Head: Normocephalic and atraumatic.     Right Ear: External ear  normal.     Left Ear: External ear normal.  Eyes:     General: No scleral icterus.       Right eye: No discharge.        Left eye: No discharge.     Extraocular Movements: Extraocular movements intact.     Conjunctiva/sclera: Conjunctivae normal.  Pulmonary:     Effort: Pulmonary effort is normal. No respiratory distress.  Musculoskeletal:     Right knee: Bony tenderness present. No effusion. Tenderness present.     Left knee: Bony tenderness present. No effusion. Tenderness present.     Right lower leg: No tenderness. No edema.     Left lower leg: No tenderness. No edema.       Legs:     Comments: Tenderness to palpation of the bilateral medial joint compartments.  Skin:    General: Skin is  warm and dry.  Neurological:     Mental Status: She is alert and oriented to person, place, and time.  Psychiatric:        Mood and Affect: Mood normal.        Behavior: Behavior normal.      No results found for any visits on 01/05/23.    The 10-year ASCVD risk score (Arnett DK, et al., 2019) is: 7.6%    Assessment & Plan:   Essential hypertension -     amLODIPine Besylate; Take 1 tablet (10 mg total) by mouth daily.  Dispense: 90 tablet; Refill: 1  Primary osteoarthritis of both knees -     traMADol HCl; Take 1 tablet (50 mg total) by mouth every 12 (twelve) hours as needed for up to 8 days.  Dispense: 15 tablet; Refill: 0 -     Diclofenac Sodium; Apply a small grape sized dollop to tender area of knees 3-4 times daily  Dispense: 150 g; Refill: 1    Return Schedule follow-up with Claris Gower..  Have increased amlodipine to 10 mg daily.  Patient reluctant to restart Aldactone.  Short course of tramadol lateral knee pain.  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  [name] Sax, MD

## 2023-01-23 ENCOUNTER — Ambulatory Visit: Payer: 59 | Admitting: Nurse Practitioner

## 2023-01-25 ENCOUNTER — Ambulatory Visit: Payer: 59 | Admitting: Nurse Practitioner

## 2023-02-07 ENCOUNTER — Encounter: Payer: Self-pay | Admitting: Nurse Practitioner

## 2023-02-07 ENCOUNTER — Ambulatory Visit: Payer: 59 | Admitting: Nurse Practitioner

## 2023-02-07 VITALS — BP 150/100 | HR 79 | Temp 97.8°F | Wt 304.8 lb

## 2023-02-07 DIAGNOSIS — M170 Bilateral primary osteoarthritis of knee: Secondary | ICD-10-CM

## 2023-02-07 DIAGNOSIS — R233 Spontaneous ecchymoses: Secondary | ICD-10-CM

## 2023-02-07 DIAGNOSIS — E1169 Type 2 diabetes mellitus with other specified complication: Secondary | ICD-10-CM | POA: Diagnosis not present

## 2023-02-07 DIAGNOSIS — Z7984 Long term (current) use of oral hypoglycemic drugs: Secondary | ICD-10-CM

## 2023-02-07 DIAGNOSIS — Z7985 Long-term (current) use of injectable non-insulin antidiabetic drugs: Secondary | ICD-10-CM | POA: Diagnosis not present

## 2023-02-07 DIAGNOSIS — I10 Essential (primary) hypertension: Secondary | ICD-10-CM | POA: Diagnosis not present

## 2023-02-07 LAB — CBC WITH DIFFERENTIAL/PLATELET
Basophils Absolute: 0 10*3/uL (ref 0.0–0.1)
Basophils Relative: 0.3 % (ref 0.0–3.0)
Eosinophils Absolute: 0 10*3/uL (ref 0.0–0.7)
Eosinophils Relative: 0.4 % (ref 0.0–5.0)
HCT: 38 % (ref 36.0–46.0)
Hemoglobin: 11.9 g/dL — ABNORMAL LOW (ref 12.0–15.0)
Lymphocytes Relative: 25.4 % (ref 12.0–46.0)
Lymphs Abs: 1.7 10*3/uL (ref 0.7–4.0)
MCHC: 31.4 g/dL (ref 30.0–36.0)
MCV: 71.6 fL — ABNORMAL LOW (ref 78.0–100.0)
Monocytes Absolute: 0.9 10*3/uL (ref 0.1–1.0)
Monocytes Relative: 13.1 % — ABNORMAL HIGH (ref 3.0–12.0)
Neutro Abs: 4.1 10*3/uL (ref 1.4–7.7)
Neutrophils Relative %: 60.8 % (ref 43.0–77.0)
Platelets: 263 10*3/uL (ref 150.0–400.0)
RBC: 5.31 Mil/uL — ABNORMAL HIGH (ref 3.87–5.11)
RDW: 18.5 % — ABNORMAL HIGH (ref 11.5–15.5)
WBC: 6.8 10*3/uL (ref 4.0–10.5)

## 2023-02-07 LAB — COMPREHENSIVE METABOLIC PANEL
ALT: 11 U/L (ref 0–35)
AST: 12 U/L (ref 0–37)
Albumin: 4.3 g/dL (ref 3.5–5.2)
Alkaline Phosphatase: 63 U/L (ref 39–117)
BUN: 16 mg/dL (ref 6–23)
CO2: 31 meq/L (ref 19–32)
Calcium: 9.2 mg/dL (ref 8.4–10.5)
Chloride: 102 meq/L (ref 96–112)
Creatinine, Ser: 0.77 mg/dL (ref 0.40–1.20)
GFR: 94.05 mL/min (ref >60.00)
Glucose, Bld: 81 mg/dL (ref 70–99)
Potassium: 3.8 meq/L (ref 3.5–5.1)
Sodium: 141 meq/L (ref 135–145)
Total Bilirubin: 0.5 mg/dL (ref 0.2–1.2)
Total Protein: 7.6 g/dL (ref 6.0–8.3)

## 2023-02-07 LAB — LIPID PANEL
Cholesterol: 167 mg/dL (ref 0–200)
HDL: 67.8 mg/dL (ref >39.00)
LDL Cholesterol: 85 mg/dL (ref 0–99)
NonHDL: 98.85
Total CHOL/HDL Ratio: 2
Triglycerides: 68 mg/dL (ref 0.0–149.0)
VLDL: 13.6 mg/dL (ref 0.0–40.0)

## 2023-02-07 LAB — MICROALBUMIN / CREATININE URINE RATIO
Creatinine,U: 92.8 mg/dL
Microalb Creat Ratio: 1.6 mg/g (ref 0.0–30.0)
Microalb, Ur: 1.5 mg/dL (ref 0.0–1.9)

## 2023-02-07 LAB — TSH: TSH: 0.85 u[IU]/mL (ref 0.35–5.50)

## 2023-02-07 LAB — HEMOGLOBIN A1C: Hgb A1c MFr Bld: 6.2 % (ref 4.6–6.5)

## 2023-02-07 MED ORDER — VALSARTAN 80 MG PO TABS: 80.0000 mg | ORAL_TABLET | Freq: Every morning | ORAL | 5 refills | Status: DC

## 2023-02-07 MED ORDER — AMLODIPINE BESYLATE 10 MG PO TABS: 10.0000 mg | ORAL_TABLET | Freq: Every evening | ORAL | 1 refills | Status: DC

## 2023-02-07 MED ORDER — TRAMADOL HCL 50 MG PO TABS: 50.0000 mg | ORAL_TABLET | Freq: Three times a day (TID) | ORAL | 0 refills | Status: AC | PRN

## 2023-02-07 MED ORDER — MELOXICAM 7.5 MG PO TABS: 7.5000 mg | ORAL_TABLET | Freq: Every day | ORAL | 2 refills | Status: DC

## 2023-02-07 NOTE — Assessment & Plan Note (Addendum)
Requested eye exam report No home glucose readings Denies any adverse effects with Trulicity and Metformin  Repeat CMP, HgbA1c, and lipid panel today Maintain med doses F/up in 3months

## 2023-02-07 NOTE — Assessment & Plan Note (Signed)
On L. Calf, starts as nodule then bruise appears. no swelling or erythema or pain Denies any known injury. No OVER THE COUNTER supplement  Check CBC, CMP today Advised to avoid ASA and NSAIDs

## 2023-02-07 NOTE — Patient Instructions (Addendum)
Start valsartan 80mg  in AM and amlodipine 10mg  in PM Maintain DASH diet Continue tramadol 50mg  every 8hrs for severe pain and tylenol 650mg  daily for mild pain Entered ortho referral Use knee high compression stocking during the day Go to lab  DASH Eating Plan DASH stands for Dietary Approaches to Stop Hypertension. The DASH eating plan is a healthy eating plan that has been shown to: Lower high blood pressure (hypertension). Reduce your risk for type 2 diabetes, heart disease, and stroke. Help with weight loss. What are tips for following this plan? Reading food labels Check food labels for the amount of salt (sodium) per serving. Choose foods with less than 5 percent of the Daily Value (DV) of sodium. In general, foods with less than 300 milligrams (mg) of sodium per serving fit into this eating plan. To find whole grains, look for the word "whole" as the first word in the ingredient list. Shopping Buy products labeled as "low-sodium" or "no salt added." Buy fresh foods. Avoid canned foods and pre-made or frozen meals. Cooking Try not to add salt when you cook. Use salt-free seasonings or herbs instead of table salt or sea salt. Check with your health care provider or pharmacist before using salt substitutes. Do not fry foods. Cook foods in healthy ways, such as baking, boiling, grilling, roasting, or broiling. Cook using oils that are good for your heart. These include olive, canola, avocado, soybean, and sunflower oil. Meal planning  Eat a balanced diet. This should include: 4 or more servings of fruits and 4 or more servings of vegetables each day. Try to fill half of your plate with fruits and vegetables. 6-8 servings of whole grains each day. 6 or less servings of lean meat, poultry, or fish each day. 1 oz is 1 serving. A 3 oz (85 g) serving of meat is about the same size as the palm of your hand. One egg is 1 oz (28 g). 2-3 servings of low-fat dairy each day. One serving is 1 cup  (237 mL). 1 serving of nuts, seeds, or beans 5 times each week. 2-3 servings of heart-healthy fats. Healthy fats called omega-3 fatty acids are found in foods such as walnuts, flaxseeds, fortified milks, and eggs. These fats are also found in cold-water fish, such as sardines, salmon, and mackerel. Limit how much you eat of: Canned or prepackaged foods. Food that is high in trans fat, such as fried foods. Food that is high in saturated fat, such as fatty meat. Desserts and other sweets, sugary drinks, and other foods with added sugar. Full-fat dairy products. Do not salt foods before eating. Do not eat more than 4 egg yolks a week. Try to eat at least 2 vegetarian meals a week. Eat more home-cooked food and less restaurant, buffet, and fast food. Lifestyle When eating at a restaurant, ask if your food can be made with less salt or no salt. If you drink alcohol: Limit how much you have to: 0-1 drink a day if you are female. 0-2 drinks a day if you are female. Know how much alcohol is in your drink. In the [place] one drink is one 12 oz bottle of beer (355 mL), one 5 oz glass of wine (148 mL), or one 1 oz glass of hard liquor (44 mL). General information Avoid eating more than 2,300 mg of salt a day. If you have hypertension, you may need to reduce your sodium intake to 1,500 mg a day. Work with your provider to stay  at a healthy body weight or lose weight. Ask what the best weight range is for you. On most days of the week, get at least 30 minutes of exercise that causes your heart to beat faster. This may include walking, swimming, or biking. Work with your provider or dietitian to adjust your eating plan to meet your specific calorie needs. What foods should I eat? Fruits All fresh, dried, or frozen fruit. Canned fruits that are in their natural juice and do not have sugar added to them. Vegetables Fresh or frozen vegetables that are raw, steamed, roasted, or grilled. Low-sodium or  reduced-sodium tomato and vegetable juice. Low-sodium or reduced-sodium tomato sauce and tomato paste. Low-sodium or reduced-sodium canned vegetables. Grains Whole-grain or whole-wheat bread. Whole-grain or whole-wheat pasta. Brown rice. Orpah Cobb. Bulgur. Whole-grain and low-sodium cereals. Pita bread. Low-fat, low-sodium crackers. Whole-wheat flour tortillas. Meats and other proteins Skinless chicken or [place]. Ground chicken or [place]. Pork with fat trimmed off. Fish and seafood. Egg whites. Dried beans, peas, or lentils. Unsalted nuts, nut butters, and seeds. Unsalted canned beans. Lean cuts of beef with fat trimmed off. Low-sodium, lean precooked or cured meat, such as sausages or meat loaves. Dairy Low-fat (1%) or fat-free (skim) milk. Reduced-fat, low-fat, or fat-free cheeses. Nonfat, low-sodium ricotta or cottage cheese. Low-fat or nonfat yogurt. Low-fat, low-sodium cheese. Fats and oils Soft margarine without trans fats. Vegetable oil. Reduced-fat, low-fat, or light mayonnaise and salad dressings (reduced-sodium). Canola, safflower, olive, avocado, soybean, and sunflower oils. Avocado. Seasonings and condiments Herbs. Spices. Seasoning mixes without salt. Other foods Unsalted popcorn and pretzels. Fat-free sweets. The items listed above may not be all the foods and drinks you can have. Talk to a dietitian to learn more. What foods should I avoid? Fruits Canned fruit in a light or heavy syrup. Fried fruit. Fruit in cream or butter sauce. Vegetables Creamed or fried vegetables. Vegetables in a cheese sauce. Regular canned vegetables that are not marked as low-sodium or reduced-sodium. Regular canned tomato sauce and paste that are not marked as low-sodium or reduced-sodium. Regular tomato and vegetable juices that are not marked as low-sodium or reduced-sodium. Rosita Fire. Olives. Grains Baked goods made with fat, such as croissants, muffins, or some breads. Dry pasta or rice meal  packs. Meats and other proteins Fatty cuts of meat. Ribs. Fried meat. Tomasa Blase. Bologna, salami, and other precooked or cured meats, such as sausages or meat loaves, that are not lean and low in sodium. Fat from the back of a pig (fatback). Bratwurst. Salted nuts and seeds. Canned beans with added salt. Canned or smoked fish. Whole eggs or egg yolks. Chicken or [place] with skin. Dairy Whole or 2% milk, cream, and half-and-half. Whole or full-fat cream cheese. Whole-fat or sweetened yogurt. Full-fat cheese. Nondairy creamers. Whipped toppings. Processed cheese and cheese spreads. Fats and oils Butter. Stick margarine. Lard. Shortening. Ghee. Bacon fat. Tropical oils, such as coconut, palm kernel, or palm oil. Seasonings and condiments Onion salt, garlic salt, seasoned salt, table salt, and sea salt. Worcestershire sauce. Tartar sauce. Barbecue sauce. Teriyaki sauce. Soy sauce, including reduced-sodium soy sauce. Steak sauce. Canned and packaged gravies. Fish sauce. Oyster sauce. Cocktail sauce. Store-bought horseradish. Ketchup. Mustard. Meat flavorings and tenderizers. Bouillon cubes. Hot sauces. Pre-made or packaged marinades. Pre-made or packaged taco seasonings. Relishes. Regular salad dressings. Other foods Salted popcorn and pretzels. The items listed above may not be all the foods and drinks you should avoid. Talk to a dietitian to learn more. Where to find more  information National Heart, Lung, and Blood Institute (NHLBI): BuffaloDryCleaner.gl American Heart Association (AHA): heart.org Academy of Nutrition and Dietetics: eatright.org National Kidney Foundation (NKF): kidney.org This information is not intended to replace advice given to you by your health care provider. Make sure you discuss any questions you have with your health care provider. Document Revised: 01/12/2022 Document Reviewed: 01/12/2022 Elsevier Patient Education  2024 [employer].

## 2023-02-07 NOTE — Assessment & Plan Note (Addendum)
Acute on chronic knee pain, associated with swelling, Worse with weight bearing activities DG left knee 10/2019:Moderate tricompartmental osteoarthritis, most prominent in the medial compartment. Degenerative changes have progressed from 2017. No improvement with mobic 7.5mg , tylenol, voltaren gel and tramadol Unable to increase mobic dose due to risk of marginal ulcer s/p Roux en Y  Continue tramadol 50mg  every 8hrs for severe pain and tylenol 650mg  daily for mild pain Entered ortho referral F/up in 84month

## 2023-02-07 NOTE — Progress Notes (Signed)
Established Patient Visit  Patient: Maria Quinn   DOB: [birth year]   43 y.o. Female  MRN: [id number] Visit Date: 02/07/2023  Subjective:    Chief Complaint  Patient presents with   Knee Pain    2 week f/u for knee pain. Bruising on left lower leg. Started out as a knot. Denies injury. Left knee swelling. Dm eye exam request from [facility] office   Essential hypertension BP not at goal with amlodipine 10mg  No Home BP checks She declined to take any diuretic due to risk of renal insufficiency. BP Readings from Last 3 Encounters:  02/07/23 (!) 150/100  01/05/23 (!) 156/110  10/26/22 122/88    Start valsartan 80mg  in AM and maintain amlodipine 10mg  in PM Advised to monitor BP at home in AM Maintain DASH diet Use knee high compression stocking during the day F/up in 67month  DM (diabetes mellitus) (HCC) Requested eye exam report No home glucose readings Denies any adverse effects with Trulicity and Metformin  Repeat CMP, HgbA1c, and lipid panel today Maintain med doses F/up in 3months  Primary osteoarthritis of both knees Acute on chronic knee pain, associated with swelling, Worse with weight bearing activities DG left knee 10/2019:Moderate tricompartmental osteoarthritis, most prominent in the medial compartment. Degenerative changes have progressed from 2017. No improvement with mobic 7.5mg , tylenol, voltaren gel and tramadol Unable to increase mobic dose due to risk of marginal ulcer s/p Roux en Y  Continue tramadol 50mg  every 8hrs for severe pain and tylenol 650mg  daily for mild pain Entered ortho referral F/up in 67month  Spontaneous bruising On L. Calf, starts as nodule then bruise appears. no swelling or erythema or pain Denies any known injury. No OVER THE COUNTER supplement  Check CBC, CMP today Advised to avoid ASA and NSAIDs  Reviewed medical, surgical, and social history today  Medications: Outpatient Medications Prior to Visit   Medication Sig   blood glucose meter kit and supplies Dispense based on patient and insurance preference. Use up to two times daily as directed. (FOR ICD-10 E10.9, E11.9). 1 year supplies   Cholecalciferol (VITAMIN D) 50 MCG (2000 UT) CAPS Take by mouth daily.   cyclobenzaprine (FLEXERIL) 5 MG tablet Take 1 tablet (5 mg total) by mouth 3 (three) times daily as needed for muscle spasms.   Dulaglutide (TRULICITY) 1.5 MG/0.5ML SOPN Inject 1.5 mg into the skin once a week.   metFORMIN (GLUCOPHAGE) 500 MG tablet Take 1 tablet (500 mg total) by mouth daily with breakfast.   [DISCONTINUED] amLODipine (NORVASC) 10 MG tablet Take 1 tablet (10 mg total) by mouth daily.   [DISCONTINUED] Ascorbic Acid (VITAMIN C PO) Take 1,000 mg by mouth daily.   [DISCONTINUED] diclofenac Sodium (VOLTAREN ARTHRITIS PAIN) 1 % GEL Apply a small grape sized dollop to tender area of knees 3-4 times daily   [DISCONTINUED] Multiple Vitamins-Minerals (CENTRUM WOMEN PO) Take by mouth. (Patient not taking: Reported on 02/07/2023)   [DISCONTINUED] spironolactone (ALDACTONE) 25 MG tablet Take 1 tablet (25 mg total) by mouth daily. (Patient not taking: Reported on 02/07/2023)   No facility-administered medications prior to visit.   Reviewed past medical and social history.   ROS per HPI above      Objective:  BP (!) 150/100 (BP Location: Left Arm, Patient Position: Sitting, Cuff Size: Normal)   Pulse 79   Temp 97.8 F (36.6 C) (Oral)   Wt (!) 304 lb 12.8 oz (  138.3 kg)   LMP 10/19/2010 Comment: No periods   SpO2 97%   BMI 50.72 kg/m      Physical Exam Vitals and nursing note reviewed.  Constitutional:      Appearance: She is obese.  Cardiovascular:     Rate and Rhythm: Normal rate and regular rhythm.     Pulses: Normal pulses.     Heart sounds: Normal heart sounds.  Pulmonary:     Effort: Pulmonary effort is normal.     Breath sounds: Normal breath sounds.  Musculoskeletal:        General: Tenderness present.      Right knee: Effusion and crepitus present. No erythema. Normal range of motion.     Left knee: Effusion and crepitus present. No erythema. Normal range of motion.     Right lower leg: Edema present.     Left lower leg: Edema present.  Skin:    Findings: Bruising present. No erythema.       Neurological:     Mental Status: She is alert and oriented to person, place, and time.     No results found for any visits on 02/07/23.    Assessment & Plan:    Problem List Items Addressed This Visit     DM (diabetes mellitus) (HCC)   Requested eye exam report No home glucose readings Denies any adverse effects with Trulicity and Metformin  Repeat CMP, HgbA1c, and lipid panel today Maintain med doses F/up in 3months      Relevant Medications   valsartan (DIOVAN) 80 MG tablet   Other Relevant Orders   Hemoglobin A1c   Lipid panel   Urine microalbumin-creatinine with uACR   Comprehensive metabolic panel   Essential hypertension - Primary   BP not at goal with amlodipine 10mg  No Home BP checks She declined to take any diuretic due to risk of renal insufficiency. BP Readings from Last 3 Encounters:  02/07/23 (!) 150/100  01/05/23 (!) 156/110  10/26/22 122/88    Start valsartan 80mg  in AM and maintain amlodipine 10mg  in PM Advised to monitor BP at home in AM Maintain DASH diet Use knee high compression stocking during the day F/up in 30month      Relevant Medications   valsartan (DIOVAN) 80 MG tablet   amLODipine (NORVASC) 10 MG tablet   Other Relevant Orders   Comprehensive metabolic panel   TSH   Primary osteoarthritis of both knees   Acute on chronic knee pain, associated with swelling, Worse with weight bearing activities DG left knee 10/2019:Moderate tricompartmental osteoarthritis, most prominent in the medial compartment. Degenerative changes have progressed from 2017. No improvement with mobic 7.5mg , tylenol, voltaren gel and tramadol Unable to increase mobic dose  due to risk of marginal ulcer s/p Roux en Y  Continue tramadol 50mg  every 8hrs for severe pain and tylenol 650mg  daily for mild pain Entered ortho referral F/up in 30month      Relevant Medications   traMADol (ULTRAM) 50 MG tablet   Other Relevant Orders   AMB referral to orthopedics   Spontaneous bruising   On L. Calf, starts as nodule then bruise appears. no swelling or erythema or pain Denies any known injury. No OVER THE COUNTER supplement  Check CBC, CMP today Advised to avoid ASA and NSAIDs      Relevant Orders   Comprehensive metabolic panel   CBC with Differential/Platelet   TSH   Return in about 4 weeks (around 03/07/2023) for HTN and knee pain.  Alysia Penna, NP

## 2023-02-07 NOTE — Assessment & Plan Note (Signed)
BP not at goal with amlodipine 10mg  No Home BP checks She declined to take any diuretic due to risk of renal insufficiency. BP Readings from Last 3 Encounters:  02/07/23 (!) 150/100  01/05/23 (!) 156/110  10/26/22 122/88    Start valsartan 80mg  in AM and maintain amlodipine 10mg  in PM Advised to monitor BP at home in AM Maintain DASH diet Use knee high compression stocking during the day F/up in 52month

## 2023-02-09 ENCOUNTER — Encounter: Payer: Self-pay | Admitting: Nurse Practitioner

## 2023-02-12 ENCOUNTER — Ambulatory Visit (INDEPENDENT_AMBULATORY_CARE_PROVIDER_SITE_OTHER): Payer: 59 | Admitting: Physician Assistant

## 2023-02-12 ENCOUNTER — Other Ambulatory Visit (INDEPENDENT_AMBULATORY_CARE_PROVIDER_SITE_OTHER): Payer: Self-pay

## 2023-02-12 DIAGNOSIS — M170 Bilateral primary osteoarthritis of knee: Secondary | ICD-10-CM

## 2023-02-12 DIAGNOSIS — M1711 Unilateral primary osteoarthritis, right knee: Secondary | ICD-10-CM | POA: Diagnosis not present

## 2023-02-12 DIAGNOSIS — M1712 Unilateral primary osteoarthritis, left knee: Secondary | ICD-10-CM

## 2023-02-12 NOTE — Progress Notes (Signed)
Office Visit Note   Patient: Maria Quinn           Date of Birth: Oct 05, 1980           MRN: [id number] Visit Date: 02/12/2023              Requested by: Anne Ng, NP [address] [place],  [place] [address] PCP: Anne Ng, NP   Assessment & Plan: Visit Diagnoses:  1. Unilateral primary osteoarthritis, left knee   2. Unilateral primary osteoarthritis, right knee     Plan: Patient is a pleasant active 43 year old woman with a history of osteoarthritis of her knees.  She runs in her family.  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  It actually is doing little bit better today.  She been taking meloxicam.  She thinks this is helping her quite a bit she is also using CBD oil which she finds helpful.  We talked about all the different options for therapy.  She could do more PT but she would like to try some exercises on her home will continue with topical treatment and the meloxicam she just needs to make sure that she periodically gets some kidney function test from her primary care.  Was not interested in injections today  Follow-Up Instructions: No follow-ups on file.   Orders:  No orders of the defined types were placed in this encounter.  No orders of the defined types were placed in this encounter.     Procedures: No procedures performed   Clinical Data: No additional findings.   Subjective: No chief complaint on file.   HPI patient is a pleasant 43 year old woman with a chief complaint of bilateral knee pain.  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  She was taking meloxicam and states with tramadol the pain was gone.   Review of Systems  All other systems reviewed and are negative.   Objective: Vital Signs: LMP 10/19/2010 Comment: No periods   Physical Exam Constitutional:      Appearance: Normal appearance.   Pulmonary:     Effort: Pulmonary effort is normal.  Skin:    General: Skin is warm and dry.  Neurological:     General: No focal deficit present.     Mental Status: She is alert and oriented to person, place, and time.  Psychiatric:        Mood and Affect: Mood normal.        Behavior: Behavior normal.   Ortho Exam Bilateral knees no effusion she does have some ecchymosis from recent history of bumping a church pew.  On the right.  She has good flexion extension but does have positive grinding.  Compartments are soft and tender she is neurovascularly intact no evidence of any infection or erythema Specialty Comments:  No specialty comments available.  Imaging: No results found.   PMFS History: Patient Active Problem List   Diagnosis Date Noted   Spontaneous bruising 02/07/2023   Anterior shoulder pain 08/04/2022   S/P total gastrectomy and Roux-en-Y esophagojejunal anastomosis 01/17/2022   LVH (left ventricular hypertrophy) 04/30/2020   Abnormal EKG 04/30/2020   Cervicalgia 01/17/2019   Chronic low back pain 01/17/2019   Depression 08/13/2017   Anxiety 05/01/2017   OSA (obstructive sleep apnea) 04/23/2017   Fatty liver 04/23/2017   Anemia 04/23/2017  Essential hypertension 07/28/2015   DM (diabetes mellitus) (HCC) 07/28/2015   Primary osteoarthritis of both knees 07/28/2015   Past Medical History:  Diagnosis Date   Anemia    Anxiety    Arthritis    Back pain    Chicken pox    Depression    Exocrine pancreatic insufficiency    Gastric bypass for obesity complicating pregnancy, birth, puerperium    Hyperlipidemia    Hypertension    Hypertension associated with diabetes (HCC)    OSA (obstructive sleep apnea)    Other fatigue    Pain in both feet 11/15/2020   Pre-diabetes    Rheumatoid arthritis (HCC)    Right-sided face pain 08/27/2018   Sleep apnea    SOB (shortness of breath) on exertion    Swelling of both lower extremities    Vitamin D deficiency      Family History  Problem Relation Age of Onset   Depression Mother    Diabetes Mother    Hypertension Mother    Miscarriages / [place] Mother    Cancer Father        [email or web address]   Diabetes Father    Heart disease Father    Hyperlipidemia Father    Hypertension Father    Kidney disease Father    Stroke Father    Depression Sister    Alcohol abuse Brother    Cancer Brother        lung    Drug abuse Brother    Arthritis Maternal Grandmother    Diabetes Maternal Grandmother    Hypertension Maternal Grandmother    Stroke Maternal Grandmother    Alcohol abuse Maternal Grandfather    Stroke Paternal Grandmother    Cancer Paternal Grandfather        ? type   Alcohol abuse Brother    Early death Brother     Past Surgical History:  Procedure Laterality Date   ABDOMINAL HYSTERECTOMY     Dr. Lang Snow 2012    COLONOSCOPY WITH PROPOFOL N/A 01/01/2019   Procedure: COLONOSCOPY WITH PROPOFOL;  Surgeon: Pasty Spillers, MD;  Location: [facility];  Service: Endoscopy;  Laterality: N/A;   ESOPHAGOGASTRODUODENOSCOPY (EGD) WITH PROPOFOL N/A 01/01/2019   Procedure: ESOPHAGOGASTRODUODENOSCOPY (EGD) WITH PROPOFOL;  Surgeon: Pasty Spillers, MD;  Location: [facility] ENDOSCOPY;  Service: Endoscopy;  Laterality: N/A;   OOPHORECTOMY     43 y.o 1 ovary out per pt, later in life 2nd ovary out    ROUX-EN-Y GASTRIC BYPASS     2010 losst down to 180s gained back    TONSILLECTOMY     1987   Social History   Occupational History   Occupation: Teacher, adult education  Tobacco Use   Smoking status: Never   Smokeless tobacco: Never  Vaping Use   Vaping status: Never Used  Substance and Sexual Activity   Alcohol use: Yes    Alcohol/week: 2.0 - 3.0 standard drinks of alcohol    Types: 2 - 3 Glasses of wine per week   Drug use: No   Sexual activity: Yes    Comment: men

## 2023-02-21 ENCOUNTER — Ambulatory Visit: Payer: 59 | Admitting: Physician Assistant

## 2023-02-27 ENCOUNTER — Encounter: Payer: Self-pay | Admitting: Nurse Practitioner

## 2023-02-27 ENCOUNTER — Telehealth (INDEPENDENT_AMBULATORY_CARE_PROVIDER_SITE_OTHER): Payer: 59 | Admitting: Nurse Practitioner

## 2023-02-27 ENCOUNTER — Ambulatory Visit (INDEPENDENT_AMBULATORY_CARE_PROVIDER_SITE_OTHER): Payer: 59 | Admitting: Physician Assistant

## 2023-02-27 DIAGNOSIS — J0100 Acute maxillary sinusitis, unspecified: Secondary | ICD-10-CM

## 2023-02-27 DIAGNOSIS — M170 Bilateral primary osteoarthritis of knee: Secondary | ICD-10-CM

## 2023-02-27 MED ORDER — DOXYCYCLINE HYCLATE 100 MG PO TABS: 100.0000 mg | ORAL_TABLET | Freq: Two times a day (BID) | ORAL | 0 refills | Status: DC

## 2023-02-27 NOTE — Progress Notes (Signed)
Office Visit Note   Patient: Maria Quinn           Date of Birth: [birth year]           MRN: [id number] Visit Date: 02/27/2023              Requested by: Anne Ng, NP [address] [place],  [place] [address] PCP: Anne Ng, NP  Chief Complaint  Patient presents with   Right Knee - Pain   Left Knee - Pain      HPI: Patient presents in follow-up today for her bilateral knees.  She has a history of osteoarthritis in both of her knees.  She is very involved in dance and has had pain in the last week or 2 enough that she has had difficulty walking.  She has tried home exercise plan as well as topical medication such as CBD oil and meloxicam.  She would like to sit discuss various injectables.  Assessment & Plan: Visit Diagnoses:  1. Primary osteoarthritis of both knees     Plan: We discussed both steroids and viscosupplementation.  At her young age she is not a good candidate for joint replacement as well as her BMI.  She did say that her mother's had knee replacements and prior to that she did well for quite a while with gel.  We will go forward with authorization This patient is diagnosed with osteoarthritis of the knee(s).    Radiographs show evidence of joint space narrowing, osteophytes, subchondral sclerosis and/or subchondral cysts.  This patient has knee pain which interferes with functional and activities of daily living.    This patient has experienced inadequate response, adverse effects and/or intolerance with conservative treatments such as acetaminophen, NSAIDS, topical creams, physical therapy or regular exercise, knee bracing and/or weight loss.   This patient has experienced inadequate response or has a contraindication to intra articular steroid injections for at least 3 months.   This patient is not scheduled to have a total knee replacement within 6 months of starting treatment with viscosupplementation.   Follow-Up Instructions:  Return in about 2 weeks (around 03/13/2023).   Ortho Exam  Patient is alert, oriented, no adenopathy, well-dressed, normal affect, normal respiratory effort.  Bilateral knee exam is stable no effusions no erythema Imaging: No results found. No images are attached to the encounter.  Labs: Lab Results  Component Value Date   HGBA1C 6.2 02/07/2023   HGBA1C 6.2 10/26/2022   HGBA1C 5.9 04/26/2022   ESRSEDRATE 55 (H) 08/27/2018   REPTSTATUS 11/02/2010 FINAL 10/29/2010   REPTSTATUS 11/02/2010 FINAL 10/29/2010   REPTSTATUS 11/01/2010 FINAL 10/29/2010   REPTSTATUS 11/01/2010 FINAL 10/29/2010   GRAMSTAIN  10/29/2010    WBC PRESENT,BOTH PMN AND MONONUCLEAR NO ORGANISMS SEEN   GRAMSTAIN  10/29/2010    WBC PRESENT,BOTH PMN AND MONONUCLEAR NO ORGANISMS SEEN   GRAMSTAIN  10/29/2010    WBC PRESENT,BOTH PMN AND MONONUCLEAR NO ORGANISMS SEEN   GRAMSTAIN  10/29/2010    WBC PRESENT,BOTH PMN AND MONONUCLEAR NO ORGANISMS SEEN   CULT NO ANAEROBES ISOLATED 10/29/2010   CULT NO ANAEROBES ISOLATED 10/29/2010   CULT NO GROWTH 3 DAYS 10/29/2010   CULT NO GROWTH 3 DAYS 10/29/2010     Lab Results  Component Value Date   ALBUMIN 4.3 02/07/2023   ALBUMIN 4.0 11/17/2022   ALBUMIN 4.4 10/26/2022   PREALBUMIN 27 04/13/2020    Lab Results  Component Value Date   MG 1.7 11/06/2010   MG  1.9 10/25/2010   MG 1.8 10/24/2010   Lab Results  Component Value Date   VD25OH 30.4 03/09/2021   VD25OH 41.9 09/14/2020   VD25OH 25.7 (L) 04/13/2020    Lab Results  Component Value Date   PREALBUMIN 27 04/13/2020      Latest Ref Rng & Units 02/07/2023   11:50 AM 01/17/2022   11:34 AM 04/13/2020    9:01 AM  CBC EXTENDED  WBC 4.0 - 10.5 K/uL 6.8  4.8  6.2   RBC 3.87 - 5.11 Mil/uL 5.31  5.35  5.41   Hemoglobin 12.0 - 15.0 g/dL 95.6  21.3  08.6   HCT 36.0 - 46.0 % 38.0  39.6  41.3   Platelets 150.0 - 400.0 K/uL 263.0  210.0  240   NEUT# 1.4 - 7.7 K/uL 4.1  2.3  3.8   Lymph# 0.7 - 4.0 K/uL 1.7  1.8  1.5       There is no height or weight on file to calculate BMI.  Orders:  No orders of the defined types were placed in this encounter.  No orders of the defined types were placed in this encounter.    Procedures: No procedures performed  Clinical Data: No additional findings.  ROS:  All other systems negative, except as noted in the HPI. Review of Systems  Objective: Vital Signs: LMP 10/19/2010 Comment: No periods   Specialty Comments:  No specialty comments available.  PMFS History: Patient Active Problem List   Diagnosis Date Noted   Spontaneous bruising 02/07/2023   Anterior shoulder pain 08/04/2022   S/P total gastrectomy and Roux-en-Y esophagojejunal anastomosis 01/17/2022   LVH (left ventricular hypertrophy) 04/30/2020   Abnormal EKG 04/30/2020   Cervicalgia 01/17/2019   Chronic low back pain 01/17/2019   Depression 08/13/2017   Anxiety 05/01/2017   OSA (obstructive sleep apnea) 04/23/2017   Fatty liver 04/23/2017   Anemia 04/23/2017   Essential hypertension 07/28/2015   DM (diabetes mellitus) (HCC) 07/28/2015   Primary osteoarthritis of both knees 07/28/2015   Past Medical History:  Diagnosis Date   Anemia    Anxiety    Arthritis    Back pain    Chicken pox    Depression    Exocrine pancreatic insufficiency    Gastric bypass for obesity complicating pregnancy, birth, puerperium    Hyperlipidemia    Hypertension    Hypertension associated with diabetes (HCC)    OSA (obstructive sleep apnea)    Other fatigue    Pain in both feet 11/15/2020   Pre-diabetes    Rheumatoid arthritis (HCC)    Right-sided face pain 08/27/2018   Sleep apnea    SOB (shortness of breath) on exertion    Swelling of both lower extremities    Vitamin D deficiency     Family History  Problem Relation Age of Onset   Depression Mother    Diabetes Mother    Hypertension Mother    Miscarriages / [place] Mother    Cancer Father        [email or web address]   Diabetes Father     Heart disease Father    Hyperlipidemia Father    Hypertension Father    Kidney disease Father    Stroke Father    Depression Sister    Alcohol abuse Brother    Cancer Brother        lung    Drug abuse Brother    Arthritis Maternal Grandmother    Diabetes Maternal Grandmother    Hypertension  Maternal Grandmother    Stroke Maternal Grandmother    Alcohol abuse Maternal Grandfather    Stroke Paternal Grandmother    Cancer Paternal Grandfather        ? type   Alcohol abuse Brother    Early death Brother     Past Surgical History:  Procedure Laterality Date   ABDOMINAL HYSTERECTOMY     Dr. Lang Snow 2012    COLONOSCOPY WITH PROPOFOL N/A 01/01/2019   Procedure: COLONOSCOPY WITH PROPOFOL;  Surgeon: Pasty Spillers, MD;  Location: [facility];  Service: Endoscopy;  Laterality: N/A;   ESOPHAGOGASTRODUODENOSCOPY (EGD) WITH PROPOFOL N/A 01/01/2019   Procedure: ESOPHAGOGASTRODUODENOSCOPY (EGD) WITH PROPOFOL;  Surgeon: Pasty Spillers, MD;  Location: [facility];  Service: Endoscopy;  Laterality: N/A;   OOPHORECTOMY     43 y.o 1 ovary out per pt, later in life 2nd ovary out    ROUX-EN-Y GASTRIC BYPASS     2010 losst down to 180s gained back    TONSILLECTOMY     1987   Social History   Occupational History   Occupation: Teacher, adult education  Tobacco Use   Smoking status: Never   Smokeless tobacco: Never  Vaping Use   Vaping status: Never Used  Substance and Sexual Activity   Alcohol use: Yes    Alcohol/week: 2.0 - 3.0 standard drinks of alcohol    Types: 2 - 3 Glasses of wine per week   Drug use: No   Sexual activity: Yes    Comment: men

## 2023-02-27 NOTE — Progress Notes (Signed)
[facility] [address] [place] [address] Dept: [phone] Dept Fax: [phone]  Virtual Video Visit  I connected with Christel T Macchia on 02/27/23 at 11:00 AM EST by a video enabled telemedicine application and verified that I am speaking with the correct person using two identifiers.  Location patient: Home Location provider: Clinic Persons participating in the virtual visit: Patient; Rodman Pickle, NP; Jodelle Green, CMA  I discussed the limitations of evaluation and management by telemedicine and the availability of in person appointments. The patient expressed understanding and agreed to proceed.  Chief Complaint  Patient presents with   Sinusitis    Sinus pain and pressure after cold last week    SUBJECTIVE:  HPI: Maria Quinn is a 43 y.o. female who presents with sinus pain and pressure for the last 3-4 days. She had a cold last week and states that her symptoms got better, however a few days later they returned with sinus pain and congestion.   UPPER RESPIRATORY TRACT INFECTION  Fever: no Cough: no Shortness of breath: no Wheezing: no Chest pain: no Chest tightness: no Chest congestion: no Nasal congestion: yes Runny nose: no Post nasal drip: yes Sneezing: no Sore throat: no Swollen glands: no Sinus pressure: yes Headache: yes Face pain: yes Toothache: no Ear pain: no bilateral Ear pressure: yes bilateral Eyes red/itching:no Eye drainage/crusting: no  Vomiting: no Rash: no Fatigue: yes Sick contacts: yes Strep contacts: no  Context: worse Recurrent sinusitis: no Relief with OTC cold/cough medications: no  Treatments attempted: theraflu severe cold    Patient Active Problem List   Diagnosis Date Noted   Spontaneous bruising 02/07/2023   Anterior shoulder pain 08/04/2022   S/P total gastrectomy and Roux-en-Y esophagojejunal anastomosis 01/17/2022   LVH (left ventricular hypertrophy)  04/30/2020   Abnormal EKG 04/30/2020   Cervicalgia 01/17/2019   Chronic low back pain 01/17/2019   Depression 08/13/2017   Anxiety 05/01/2017   OSA (obstructive sleep apnea) 04/23/2017   Fatty liver 04/23/2017   Anemia 04/23/2017   Essential hypertension 07/28/2015   DM (diabetes mellitus) (HCC) 07/28/2015   Primary osteoarthritis of both knees 07/28/2015    Past Surgical History:  Procedure Laterality Date   ABDOMINAL HYSTERECTOMY     Dr. Lang Snow 2012    COLONOSCOPY WITH PROPOFOL N/A 01/01/2019   Procedure: COLONOSCOPY WITH PROPOFOL;  Surgeon: Pasty Spillers, MD;  Location: [facility];  Service: Endoscopy;  Laterality: N/A;   ESOPHAGOGASTRODUODENOSCOPY (EGD) WITH PROPOFOL N/A 01/01/2019   Procedure: ESOPHAGOGASTRODUODENOSCOPY (EGD) WITH PROPOFOL;  Surgeon: Pasty Spillers, MD;  Location: [facility];  Service: Endoscopy;  Laterality: N/A;   OOPHORECTOMY     43 y.o 1 ovary out per pt, later in life 2nd ovary out    ROUX-EN-Y GASTRIC BYPASS     2010 losst down to 180s gained back    TONSILLECTOMY     1987    Family History  Problem Relation Age of Onset   Depression Mother    Diabetes Mother    Hypertension Mother    Miscarriages / [place] Mother    Cancer Father        [email or web address].[email or web address]   Diabetes Father    Heart disease Father    Hyperlipidemia Father    Hypertension Father    Kidney disease Father    Stroke Father    Depression Sister    Alcohol abuse Brother    Cancer Brother        lung  Drug abuse Brother    Arthritis Maternal Grandmother    Diabetes Maternal Grandmother    Hypertension Maternal Grandmother    Stroke Maternal Grandmother    Alcohol abuse Maternal Grandfather    Stroke Paternal Grandmother    Cancer Paternal Grandfather        ? type   Alcohol abuse Brother    Early death Brother     Social History   Tobacco Use   Smoking status: Never   Smokeless tobacco: Never  Vaping Use   Vaping status: Never Used   Substance Use Topics   Alcohol use: Yes    Alcohol/week: 2.0 - 3.0 standard drinks of alcohol    Types: 2 - 3 Glasses of wine per week   Drug use: No     Current Outpatient Medications:    doxycycline (VIBRA-TABS) 100 MG tablet, Take 1 tablet (100 mg total) by mouth 2 (two) times daily., Disp: 20 tablet, Rfl: 0   amLODipine (NORVASC) 10 MG tablet, Take 1 tablet (10 mg total) by mouth every evening., Disp: 90 tablet, Rfl: 1   blood glucose meter kit and supplies, Dispense based on patient and insurance preference. Use up to two times daily as directed. (FOR ICD-10 E10.9, E11.9). 1 year supplies, Disp: 1 each, Rfl: 0   Cholecalciferol (VITAMIN D) 50 MCG (2000 UT) CAPS, Take by mouth daily., Disp: , Rfl:    cyclobenzaprine (FLEXERIL) 5 MG tablet, Take 1 tablet (5 mg total) by mouth 3 (three) times daily as needed for muscle spasms., Disp: 30 tablet, Rfl: 0   Dulaglutide (TRULICITY) 1.5 MG/0.5ML SOPN, Inject 1.5 mg into the skin once a week., Disp: 6 mL, Rfl: 1   metFORMIN (GLUCOPHAGE) 500 MG tablet, Take 1 tablet (500 mg total) by mouth daily with breakfast., Disp: 90 tablet, Rfl: 1   valsartan (DIOVAN) 80 MG tablet, Take 1 tablet (80 mg total) by mouth in the morning., Disp: 30 tablet, Rfl: 5  Allergies  Allergen Reactions   Penicillins Rash    ROS: See pertinent positives and negatives per HPI.  OBSERVATIONS/OBJECTIVE:  VITALS per patient if applicable: Today's Vitals   There is no height or weight on file to calculate BMI.    GENERAL: Alert and oriented. Appears well and in no acute distress.  HEENT: Atraumatic. Conjunctiva clear. No obvious abnormalities on inspection of external nose and ears. Maxillary sinus pain with self palpation  NECK: Normal movements of the head and neck.  LUNGS: On inspection, no signs of respiratory distress. Breathing rate appears normal. No obvious gross SOB, gasping or wheezing, and no conversational dyspnea.  CV: No obvious cyanosis.  MS:  Moves all visible extremities without noticeable abnormality.  PSYCH/NEURO: Pleasant and cooperative. No obvious depression or anxiety. Speech and thought processing grossly intact.  ASSESSMENT AND PLAN:  Problem List Items Addressed This Visit   None Visit Diagnoses       Acute non-recurrent maxillary sinusitis    -  Primary   Start doxycycline 100mg BID x10 days. Encourage fluids, rest. Can use netti pot or nasal saline spray to help with sinus pain. F/U if not improving.   Relevant Medications   doxycycline (VIBRA-TABS) 100 MG tablet        I discussed the assessment and treatment plan with the patient. The patient was provided an opportunity to ask questions and all were answered. The patient agreed with the plan and demonstrated an understanding of the instructions.   The patient was advised to call back  or seek an in-person evaluation if the symptoms worsen or if the condition fails to improve as anticipated.   Gerre Scull, NP

## 2023-02-27 NOTE — Patient Instructions (Signed)
It was great to see you!  Start doxycycline twice a day for 10 days with food  Drink plenty of fluids and get rest  You can use a netti pot or nasal saline spray as needed. If using netti pot, make sure you use distilled water.   Let's follow-up if your symptoms worsen or don't improve.   Take care,  Rodman Pickle, NP

## 2023-03-07 ENCOUNTER — Ambulatory Visit: Payer: 59 | Admitting: Nurse Practitioner

## 2023-03-13 ENCOUNTER — Telehealth: Payer: Self-pay

## 2023-03-13 NOTE — Telephone Encounter (Signed)
 VOB submitted for Monovisc, bilateral knee

## 2023-03-13 NOTE — Telephone Encounter (Signed)
-----   Message from [place] W sent at 02/27/2023  3:52 PM EST ----- Regarding: Gel injections bilateral knees Hey Kindall Swaby,  Could we please get prior authorization for gel injections for this patient? Bilateral knees.  Thanks! Isabelle Course

## 2023-03-16 ENCOUNTER — Telehealth: Payer: Self-pay | Admitting: Nurse Practitioner

## 2023-03-16 ENCOUNTER — Encounter: Payer: Self-pay | Admitting: Nurse Practitioner

## 2023-03-16 ENCOUNTER — Ambulatory Visit: Payer: 59 | Admitting: Nurse Practitioner

## 2023-03-16 VITALS — BP 150/100 | HR 68 | Temp 98.5°F | Wt 301.4 lb

## 2023-03-16 DIAGNOSIS — Z7984 Long term (current) use of oral hypoglycemic drugs: Secondary | ICD-10-CM | POA: Diagnosis not present

## 2023-03-16 DIAGNOSIS — Z980 Intestinal bypass and anastomosis status: Secondary | ICD-10-CM

## 2023-03-16 DIAGNOSIS — Z7985 Long-term (current) use of injectable non-insulin antidiabetic drugs: Secondary | ICD-10-CM

## 2023-03-16 DIAGNOSIS — Z903 Acquired absence of stomach [part of]: Secondary | ICD-10-CM

## 2023-03-16 DIAGNOSIS — I10 Essential (primary) hypertension: Secondary | ICD-10-CM | POA: Diagnosis not present

## 2023-03-16 DIAGNOSIS — E1165 Type 2 diabetes mellitus with hyperglycemia: Secondary | ICD-10-CM

## 2023-03-16 DIAGNOSIS — M170 Bilateral primary osteoarthritis of knee: Secondary | ICD-10-CM

## 2023-03-16 DIAGNOSIS — E66813 Obesity, class 3: Secondary | ICD-10-CM

## 2023-03-16 DIAGNOSIS — Z6841 Body Mass Index (BMI) 40.0 and over, adult: Secondary | ICD-10-CM

## 2023-03-16 MED ORDER — OZEMPIC (1 MG/DOSE) 4 MG/3ML ~~LOC~~ SOPN: 1.0000 mg | PEN_INJECTOR | SUBCUTANEOUS | 5 refills | Status: DC

## 2023-03-16 MED ORDER — VALSARTAN 160 MG PO TABS: 160.0000 mg | ORAL_TABLET | Freq: Every day | ORAL | 3 refills | Status: DC

## 2023-03-16 MED ORDER — METFORMIN HCL 500 MG PO TABS: 500.0000 mg | ORAL_TABLET | Freq: Every day | ORAL | 1 refills | Status: DC

## 2023-03-16 NOTE — Telephone Encounter (Signed)
 10/25/2022 late arrival 03/07/2023 no show  Pt was seen today 03/16/2023. Final warning sent via mail and mychart.

## 2023-03-16 NOTE — Progress Notes (Signed)
 Established Patient Visit  Patient: Maria Quinn   DOB: 1980-03-11   43 y.o. Female  MRN: [id number] Visit Date: 03/16/2023  Subjective:    Chief Complaint  Patient presents with   Medical Management of Chronic Issues    Follow up on HTN and knee pain. Knee pain is better. Discuss Trulicity dosage. DM eye exam scheduled 04/2023.    HPI DM (diabetes mellitus) (HCC) Reports nausea with trulicity. Worse with high fat meals or red meat. Reports symptoms were not present when on ozempic. Last hgbA1c at 6.2%  Switched trulicity 1.5mg  to ozempic 1mg  Maintain metformin dose F/up in 34months Check CMP and lipase  Primary osteoarthritis of both knees Reports improved pain with mobic, but can not continue NSAID due to s/p gastric surgery. Advised to maintain voltaren gel and tylenol Shs is waiting for appointment with ortho to receive gel injections.  S/P total gastrectomy and Roux-en-Y esophagojejunal anastomosis Wt Readings from Last 3 Encounters:  03/16/23 (!) 301 lb 6.4 oz (136.7 kg)  02/07/23 (!) 304 lb 12.8 oz (138.3 kg)  01/05/23 (!) 310 lb 6.4 oz (140.8 kg)    Difficulty with weight loss despite exercise, diet modification and current use of GLP-1 RA. She reports hx of exocrine pancreatic insufficiency post gastric surgery Entered referral to bariatric clinic  Essential hypertension BP not at goal with amlodipine 10mg  and valsartan 80mg  Reports compliance with meds and DASH diet No CPAP use BP Readings from Last 3 Encounters:  03/16/23 (!) 150/100  02/07/23 (!) 150/100  01/05/23 (!) 156/110    Increase valsartan to 160mg , maintain amlodipine dose F/up in 34month  Reviewed medical, surgical, and social history today  Medications: Outpatient Medications Prior to Visit  Medication Sig   amLODipine (NORVASC) 10 MG tablet Take 1 tablet (10 mg total) by mouth every evening.   blood glucose meter kit and supplies Dispense based on patient and insurance  preference. Use up to two times daily as directed. (FOR ICD-10 E10.9, E11.9). 1 year supplies   Cholecalciferol (VITAMIN D) 50 MCG (2000 UT) CAPS Take by mouth daily.   cyclobenzaprine (FLEXERIL) 5 MG tablet Take 1 tablet (5 mg total) by mouth 3 (three) times daily as needed for muscle spasms.   [DISCONTINUED] doxycycline (VIBRA-TABS) 100 MG tablet Take 1 tablet (100 mg total) by mouth 2 (two) times daily.   [DISCONTINUED] Dulaglutide (TRULICITY) 1.5 MG/0.5ML SOPN Inject 1.5 mg into the skin once a week.   [DISCONTINUED] metFORMIN (GLUCOPHAGE) 500 MG tablet Take 1 tablet (500 mg total) by mouth daily with breakfast.   [DISCONTINUED] valsartan (DIOVAN) 80 MG tablet Take 1 tablet (80 mg total) by mouth in the morning.   No facility-administered medications prior to visit.   Reviewed past medical and social history.   ROS per HPI above      Objective:  BP (!) 150/100 (BP Location: Left Arm, Patient Position: Sitting, Cuff Size: Normal)   Pulse 68   Temp 98.5 F (36.9 C) (Temporal)   Wt (!) 301 lb 6.4 oz (136.7 kg)   LMP 10/19/2010 Comment: No periods   BMI 50.16 kg/m      Physical Exam Vitals and nursing note reviewed.  Cardiovascular:     Rate and Rhythm: Normal rate and regular rhythm.     Pulses: Normal pulses.     Heart sounds: Normal heart sounds.  Pulmonary:     Effort: Pulmonary effort is normal.  Breath sounds: Normal breath sounds.  Musculoskeletal:     Right lower leg: No edema.     Left lower leg: No edema.  Neurological:     Mental Status: She is alert and oriented to person, place, and time.     No results found for any visits on 03/16/23.    Assessment & Plan:    Problem List Items Addressed This Visit     DM (diabetes mellitus) (HCC)   Reports nausea with trulicity. Worse with high fat meals or red meat. Reports symptoms were not present when on ozempic. Last hgbA1c at 6.2%  Switched trulicity 1.5mg  to ozempic 1mg  Maintain metformin dose F/up in  4months Check CMP and lipase      Relevant Medications   Semaglutide, 1 MG/DOSE, (OZEMPIC, 1 MG/DOSE,) 4 MG/3ML SOPN   metFORMIN (GLUCOPHAGE) 500 MG tablet   valsartan (DIOVAN) 160 MG tablet   Essential hypertension - Primary   BP not at goal with amlodipine 10mg  and valsartan 80mg  Reports compliance with meds and DASH diet No CPAP use BP Readings from Last 3 Encounters:  03/16/23 (!) 150/100  02/07/23 (!) 150/100  01/05/23 (!) 156/110    Increase valsartan to 160mg , maintain amlodipine dose F/up in 4month      Relevant Medications   valsartan (DIOVAN) 160 MG tablet   Primary osteoarthritis of both knees   Reports improved pain with mobic, but can not continue NSAID due to s/p gastric surgery. Advised to maintain voltaren gel and tylenol Shs is waiting for appointment with ortho to receive gel injections.      S/P total gastrectomy and Roux-en-Y esophagojejunal anastomosis   Wt Readings from Last 3 Encounters:  03/16/23 (!) 301 lb 6.4 oz (136.7 kg)  02/07/23 (!) 304 lb 12.8 oz (138.3 kg)  01/05/23 (!) 310 lb 6.4 oz (140.8 kg)    Difficulty with weight loss despite exercise, diet modification and current use of GLP-1 RA. She reports hx of exocrine pancreatic insufficiency post gastric surgery Entered referral to bariatric clinic      Relevant Orders   Amb Referral to Bariatric Surgery   Other Visit Diagnoses       Class 3 severe obesity due to excess calories with serious comorbidity and body mass index (BMI) of 50.0 to 59.9 in adult [facility])       Relevant Medications   Semaglutide, 1 MG/DOSE, (OZEMPIC, 1 MG/DOSE,) 4 MG/3ML SOPN   metFORMIN (GLUCOPHAGE) 500 MG tablet   Other Relevant Orders   Amb Referral to Bariatric Surgery      Return in about 4 weeks (around 04/13/2023) for HTN (repeat CMP, and lipase).     Alysia Penna, NP

## 2023-03-16 NOTE — Assessment & Plan Note (Signed)
 Reports improved pain with mobic, but can not continue NSAID due to s/p gastric surgery. Advised to maintain voltaren gel and tylenol Shs is waiting for appointment with ortho to receive gel injections.

## 2023-03-16 NOTE — Patient Instructions (Signed)
 Increase valsartan to 160mg  daily Maintain amlodipine dose. Maintain DASH diet Avoid NSAIDs due to gastric surgery. Only use tylenol 650mg  every 8hrs for pain.

## 2023-03-16 NOTE — Assessment & Plan Note (Signed)
 Reports nausea with trulicity. Worse with high fat meals or red meat. Reports symptoms were not present when on ozempic. Last hgbA1c at 6.2%  Switched trulicity 1.5mg  to ozempic 1mg  Maintain metformin dose F/up in 1months Check CMP and lipase

## 2023-03-16 NOTE — Assessment & Plan Note (Signed)
 Wt Readings from Last 3 Encounters:  03/16/23 (!) 301 lb 6.4 oz (136.7 kg)  02/07/23 (!) 304 lb 12.8 oz (138.3 kg)  01/05/23 (!) 310 lb 6.4 oz (140.8 kg)    Difficulty with weight loss despite exercise, diet modification and current use of GLP-1 RA. She reports hx of exocrine pancreatic insufficiency post gastric surgery Entered referral to bariatric clinic

## 2023-03-16 NOTE — Assessment & Plan Note (Signed)
 BP not at goal with amlodipine 10mg  and valsartan 80mg  Reports compliance with meds and DASH diet No CPAP use BP Readings from Last 3 Encounters:  03/16/23 (!) 150/100  02/07/23 (!) 150/100  01/05/23 (!) 156/110    Increase valsartan to 160mg , maintain amlodipine dose F/up in 48month

## 2023-03-19 ENCOUNTER — Other Ambulatory Visit (HOSPITAL_COMMUNITY): Payer: Self-pay

## 2023-03-19 ENCOUNTER — Telehealth: Payer: Self-pay

## 2023-03-19 NOTE — Telephone Encounter (Signed)
 Pharmacy Patient Advocate Encounter   Received notification from CoverMyMeds that prior authorization for Ozempic (1 MG/DOSE) 4MG /3ML pen-injectors is required/requested.   Insurance verification completed.   The patient is insured through [facility] .   Per test claim: PA required; PA submitted to above mentioned insurance via CoverMyMeds Key/confirmation #/EOC (Key: [id number])  Status is pending

## 2023-03-22 ENCOUNTER — Other Ambulatory Visit (HOSPITAL_COMMUNITY): Payer: Self-pay

## 2023-03-22 NOTE — Telephone Encounter (Signed)
 Pharmacy Patient Advocate Encounter  Received notification from [facility] that Prior Authorization for Ozempic (1 MG/DOSE) 4MG /3ML pen-injectors has been APPROVED from 3.13.25 to 3.13.26. Ran test claim, Copay is $926.11. This test claim was processed through [facility]- copay amounts may vary at other pharmacies due to pharmacy/plan contracts, or as the patient moves through the different stages of their insurance plan.    100% PATIENT COINSURANCE- PATIENT SHOULD CONTACT HEALTH INSURANCE PLAN WITH QUESTIONS   PA #/Case ID/Reference #: (Key: [id number])

## 2023-04-05 ENCOUNTER — Other Ambulatory Visit: Payer: Self-pay | Admitting: Nurse Practitioner

## 2023-04-05 DIAGNOSIS — E1165 Type 2 diabetes mellitus with hyperglycemia: Secondary | ICD-10-CM

## 2023-04-11 ENCOUNTER — Telehealth: Payer: Self-pay

## 2023-04-11 DIAGNOSIS — Z903 Acquired absence of stomach [part of]: Secondary | ICD-10-CM

## 2023-04-11 DIAGNOSIS — E66813 Obesity, class 3: Secondary | ICD-10-CM

## 2023-04-11 NOTE — Telephone Encounter (Signed)
 Copied from CRM [id number]. Topic: General - Other >> Apr 11, 2023  1:25 PM Eunice Blase wrote: Reason for CRM: Pt called to provide bariatric Dr. Weyman Pedro ph: [phone], [place] [address] fax: [phone].

## 2023-04-11 NOTE — Telephone Encounter (Addendum)
 Called patient to confirm that I received the the needed information for the referral.  I placed new referral to [facility].

## 2023-04-11 NOTE — Telephone Encounter (Signed)
 Forwarding message below

## 2023-04-12 ENCOUNTER — Encounter: Payer: Self-pay | Admitting: Nurse Practitioner

## 2023-04-12 ENCOUNTER — Ambulatory Visit: Admitting: Nurse Practitioner

## 2023-04-12 VITALS — BP 150/110 | HR 72 | Temp 98.2°F | Ht 64.0 in | Wt 299.4 lb

## 2023-04-12 DIAGNOSIS — G4733 Obstructive sleep apnea (adult) (pediatric): Secondary | ICD-10-CM

## 2023-04-12 DIAGNOSIS — I517 Cardiomegaly: Secondary | ICD-10-CM | POA: Diagnosis not present

## 2023-04-12 DIAGNOSIS — I10 Essential (primary) hypertension: Secondary | ICD-10-CM | POA: Diagnosis not present

## 2023-04-12 MED ORDER — HYDRALAZINE HCL 25 MG PO TABS: 25.0000 mg | ORAL_TABLET | Freq: Two times a day (BID) | ORAL | 5 refills | Status: DC

## 2023-04-12 NOTE — Progress Notes (Signed)
 Established Patient Visit  Patient: Maria Quinn   DOB: [birth year]   43 y.o. Female  MRN: [id number] Visit Date: 04/12/2023  Subjective:    Chief Complaint  Patient presents with   Follow-up    4 week f/u for HTN    HPI Essential hypertension BP not at goal Reports compliance with losartan and amlodipine BP Readings from Last 3 Encounters:  04/12/23 (!) 150/110  03/16/23 (!) 150/100  02/07/23 (!) 150/100    Unable to use beta blocker due to baseline HR: 60s-70s She declined to use any diuretic due to concern about possible renal insufficiency. Add hydralazine 25mg  BID Entered referral to sleep specialist. F/up in 70month  Wt Readings from Last 3 Encounters:  04/12/23 299 lb 6.4 oz (135.8 kg)  03/16/23 (!) 301 lb 6.4 oz (136.7 kg)  02/07/23 (!) 304 lb 12.8 oz (138.3 kg)    Reviewed medical, surgical, and social history today  Medications: Outpatient Medications Prior to Visit  Medication Sig   amLODipine (NORVASC) 10 MG tablet Take 1 tablet (10 mg total) by mouth every evening.   blood glucose meter kit and supplies Dispense based on patient and insurance preference. Use up to two times daily as directed. (FOR ICD-10 E10.9, E11.9). 1 year supplies   Cholecalciferol (VITAMIN D) 50 MCG (2000 UT) CAPS Take by mouth daily.   cyclobenzaprine (FLEXERIL) 5 MG tablet Take 1 tablet (5 mg total) by mouth 3 (three) times daily as needed for muscle spasms.   meloxicam (MOBIC) 7.5 MG tablet Take 7.5 mg by mouth daily.   metFORMIN (GLUCOPHAGE) 500 MG tablet Take 1 tablet (500 mg total) by mouth daily with breakfast.   Semaglutide, 1 MG/DOSE, (OZEMPIC, 1 MG/DOSE,) 4 MG/3ML SOPN Inject 1 mg into the skin once a week.   valsartan (DIOVAN) 160 MG tablet Take 1 tablet (160 mg total) by mouth daily.   No facility-administered medications prior to visit.   Reviewed past medical and social history.   ROS per HPI above      Objective:  BP (!) 150/110 (BP Location: Left  Arm, Patient Position: Supine, Cuff Size: Normal)   Pulse 72   Temp 98.2 F (36.8 C) (Temporal)   Ht 5\' 4"  (1.626 m)   Wt 299 lb 6.4 oz (135.8 kg)   LMP 10/19/2010 Comment: No periods   BMI 51.39 kg/m      Physical Exam Vitals and nursing note reviewed.  Constitutional:      Appearance: She is obese.  Cardiovascular:     Rate and Rhythm: Normal rate and regular rhythm.     Pulses: Normal pulses.     Heart sounds: Normal heart sounds.  Pulmonary:     Effort: Pulmonary effort is normal.     Breath sounds: Normal breath sounds.  Musculoskeletal:     Right lower leg: Edema present.     Left lower leg: Edema present.  Neurological:     Mental Status: She is alert and oriented to person, place, and time.     No results found for any visits on 04/12/23.    Assessment & Plan:    Problem List Items Addressed This Visit     Essential hypertension - Primary   BP not at goal Reports compliance with losartan and amlodipine BP Readings from Last 3 Encounters:  04/12/23 (!) 150/110  03/16/23 (!) 150/100  02/07/23 (!) 150/100    Unable to  use beta blocker due to baseline HR: 60s-70s She declined to use any diuretic due to concern about possible renal insufficiency. Add hydralazine 25mg  BID Entered referral to sleep specialist. F/up in 29month      Relevant Medications   hydrALAZINE (APRESOLINE) 25 MG tablet   Other Relevant Orders   Ambulatory referral to Sleep Studies   LVH (left ventricular hypertrophy)   Relevant Medications   hydrALAZINE (APRESOLINE) 25 MG tablet   Other Relevant Orders   Ambulatory referral to Sleep Studies   OSA (obstructive sleep apnea)   Relevant Orders   Ambulatory referral to Sleep Studies   Return in about 4 weeks (around 05/10/2023) for DM, hyperlipidemia (fasting).     Alysia Penna, NP

## 2023-04-12 NOTE — Assessment & Plan Note (Signed)
 BP not at goal Reports compliance with losartan and amlodipine BP Readings from Last 3 Encounters:  04/12/23 (!) 150/110  03/16/23 (!) 150/100  02/07/23 (!) 150/100    Unable to use beta blocker due to baseline HR: 60s-70s She declined to use any diuretic due to concern about possible renal insufficiency. Add hydralazine 25mg  BID Entered referral to sleep specialist. F/up in 32month

## 2023-04-12 NOTE — Patient Instructions (Addendum)
 Maintain current med doses Add hydralazine 25mg  in Am and PM Monitor BP at home daily in AM. Bring readings to next appointment. Schedule appointment with sleep specialist  DASH Eating Plan DASH stands for Dietary Approaches to Stop Hypertension. The DASH eating plan is a healthy eating plan that has been shown to: Lower high blood pressure (hypertension). Reduce your risk for type 2 diabetes, heart disease, and stroke. Help with weight loss. What are tips for following this plan? Reading food labels Check food labels for the amount of salt (sodium) per serving. Choose foods with less than 5 percent of the Daily Value (DV) of sodium. In general, foods with less than 300 milligrams (mg) of sodium per serving fit into this eating plan. To find whole grains, look for the word "whole" as the first word in the ingredient list. Shopping Buy products labeled as "low-sodium" or "no salt added." Buy fresh foods. Avoid canned foods and pre-made or frozen meals. Cooking Try not to add salt when you cook. Use salt-free seasonings or herbs instead of table salt or sea salt. Check with your health care provider or pharmacist before using salt substitutes. Do not fry foods. Cook foods in healthy ways, such as baking, boiling, grilling, roasting, or broiling. Cook using oils that are good for your heart. These include olive, canola, avocado, soybean, and sunflower oil. Meal planning  Eat a balanced diet. This should include: 4 or more servings of fruits and 4 or more servings of vegetables each day. Try to fill half of your plate with fruits and vegetables. 6-8 servings of whole grains each day. 6 or less servings of lean meat, poultry, or fish each day. 1 oz is 1 serving. A 3 oz (85 g) serving of meat is about the same size as the palm of your hand. One egg is 1 oz (28 g). 2-3 servings of low-fat dairy each day. One serving is 1 cup (237 mL). 1 serving of nuts, seeds, or beans 5 times each week. 2-3  servings of heart-healthy fats. Healthy fats called omega-3 fatty acids are found in foods such as walnuts, flaxseeds, fortified milks, and eggs. These fats are also found in cold-water fish, such as sardines, salmon, and mackerel. Limit how much you eat of: Canned or prepackaged foods. Food that is high in trans fat, such as fried foods. Food that is high in saturated fat, such as fatty meat. Desserts and other sweets, sugary drinks, and other foods with added sugar. Full-fat dairy products. Do not salt foods before eating. Do not eat more than 4 egg yolks a week. Try to eat at least 2 vegetarian meals a week. Eat more home-cooked food and less restaurant, buffet, and fast food. Lifestyle When eating at a restaurant, ask if your food can be made with less salt or no salt. If you drink alcohol: Limit how much you have to: 0-1 drink a day if you are female. 0-2 drinks a day if you are female. Know how much alcohol is in your drink. In the [place] one drink is one 12 oz bottle of beer (355 mL), one 5 oz glass of wine (148 mL), or one 1 oz glass of hard liquor (44 mL). General information Avoid eating more than 2,300 mg of salt a day. If you have hypertension, you may need to reduce your sodium intake to 1,500 mg a day. Work with your provider to stay at a healthy body weight or lose weight. Ask what the best weight range  is for you. On most days of the week, get at least 30 minutes of exercise that causes your heart to beat faster. This may include walking, swimming, or biking. Work with your provider or dietitian to adjust your eating plan to meet your specific calorie needs. What foods should I eat? Fruits All fresh, dried, or frozen fruit. Canned fruits that are in their natural juice and do not have sugar added to them. Vegetables Fresh or frozen vegetables that are raw, steamed, roasted, or grilled. Low-sodium or reduced-sodium tomato and vegetable juice. Low-sodium or reduced-sodium  tomato sauce and tomato paste. Low-sodium or reduced-sodium canned vegetables. Grains Whole-grain or whole-wheat bread. Whole-grain or whole-wheat pasta. Brown rice. Orpah Cobb. Bulgur. Whole-grain and low-sodium cereals. Pita bread. Low-fat, low-sodium crackers. Whole-wheat flour tortillas. Meats and other proteins Skinless chicken or [place]. Ground chicken or [place]. Pork with fat trimmed off. Fish and seafood. Egg whites. Dried beans, peas, or lentils. Unsalted nuts, nut butters, and seeds. Unsalted canned beans. Lean cuts of beef with fat trimmed off. Low-sodium, lean precooked or cured meat, such as sausages or meat loaves. Dairy Low-fat (1%) or fat-free (skim) milk. Reduced-fat, low-fat, or fat-free cheeses. Nonfat, low-sodium ricotta or cottage cheese. Low-fat or nonfat yogurt. Low-fat, low-sodium cheese. Fats and oils Soft margarine without trans fats. Vegetable oil. Reduced-fat, low-fat, or light mayonnaise and salad dressings (reduced-sodium). Canola, safflower, olive, avocado, soybean, and sunflower oils. Avocado. Seasonings and condiments Herbs. Spices. Seasoning mixes without salt. Other foods Unsalted popcorn and pretzels. Fat-free sweets. The items listed above may not be all the foods and drinks you can have. Talk to a dietitian to learn more. What foods should I avoid? Fruits Canned fruit in a light or heavy syrup. Fried fruit. Fruit in cream or butter sauce. Vegetables Creamed or fried vegetables. Vegetables in a cheese sauce. Regular canned vegetables that are not marked as low-sodium or reduced-sodium. Regular canned tomato sauce and paste that are not marked as low-sodium or reduced-sodium. Regular tomato and vegetable juices that are not marked as low-sodium or reduced-sodium. Rosita Fire. Olives. Grains Baked goods made with fat, such as croissants, muffins, or some breads. Dry pasta or rice meal packs. Meats and other proteins Fatty cuts of meat. Ribs. Fried meat.  Tomasa Blase. Bologna, salami, and other precooked or cured meats, such as sausages or meat loaves, that are not lean and low in sodium. Fat from the back of a pig (fatback). Bratwurst. Salted nuts and seeds. Canned beans with added salt. Canned or smoked fish. Whole eggs or egg yolks. Chicken or [place] with skin. Dairy Whole or 2% milk, cream, and half-and-half. Whole or full-fat cream cheese. Whole-fat or sweetened yogurt. Full-fat cheese. Nondairy creamers. Whipped toppings. Processed cheese and cheese spreads. Fats and oils Butter. Stick margarine. Lard. Shortening. Ghee. Bacon fat. Tropical oils, such as coconut, palm kernel, or palm oil. Seasonings and condiments Onion salt, garlic salt, seasoned salt, table salt, and sea salt. Worcestershire sauce. Tartar sauce. Barbecue sauce. Teriyaki sauce. Soy sauce, including reduced-sodium soy sauce. Steak sauce. Canned and packaged gravies. Fish sauce. Oyster sauce. Cocktail sauce. Store-bought horseradish. Ketchup. Mustard. Meat flavorings and tenderizers. Bouillon cubes. Hot sauces. Pre-made or packaged marinades. Pre-made or packaged taco seasonings. Relishes. Regular salad dressings. Other foods Salted popcorn and pretzels. The items listed above may not be all the foods and drinks you should avoid. Talk to a dietitian to learn more. Where to find more information National Heart, Lung, and Blood Institute (NHLBI): [place]DryCleaner.gl American Heart Association (AHA): heart.org  Academy of Nutrition and Dietetics: eatright.org National Kidney Foundation (NKF): kidney.org This information is not intended to replace advice given to you by your health care provider. Make sure you discuss any questions you have with your health care provider. Document Revised: 01/12/2022 Document Reviewed: 01/12/2022 Elsevier Patient Education  2024 [employer].

## 2023-04-26 ENCOUNTER — Ambulatory Visit: Payer: 59 | Admitting: Nurse Practitioner

## 2023-05-09 ENCOUNTER — Institutional Professional Consult (permissible substitution): Admitting: Neurology

## 2023-05-16 ENCOUNTER — Encounter: Payer: Self-pay | Admitting: Nurse Practitioner

## 2023-05-16 ENCOUNTER — Ambulatory Visit: Admitting: Nurse Practitioner

## 2023-05-16 VITALS — BP 132/90 | Temp 98.0°F | Ht 65.0 in | Wt 304.2 lb

## 2023-05-16 DIAGNOSIS — E1169 Type 2 diabetes mellitus with other specified complication: Secondary | ICD-10-CM | POA: Diagnosis not present

## 2023-05-16 DIAGNOSIS — E1165 Type 2 diabetes mellitus with hyperglycemia: Secondary | ICD-10-CM | POA: Diagnosis not present

## 2023-05-16 DIAGNOSIS — I10 Essential (primary) hypertension: Secondary | ICD-10-CM

## 2023-05-16 DIAGNOSIS — D500 Iron deficiency anemia secondary to blood loss (chronic): Secondary | ICD-10-CM

## 2023-05-16 DIAGNOSIS — E785 Hyperlipidemia, unspecified: Secondary | ICD-10-CM | POA: Diagnosis not present

## 2023-05-16 DIAGNOSIS — Z7984 Long term (current) use of oral hypoglycemic drugs: Secondary | ICD-10-CM

## 2023-05-16 LAB — RENAL FUNCTION PANEL
Albumin: 4 g/dL (ref 3.5–5.2)
BUN: 11 mg/dL (ref 6–23)
CO2: 31 meq/L (ref 19–32)
Calcium: 8.4 mg/dL (ref 8.4–10.5)
Chloride: 103 meq/L (ref 96–112)
Creatinine, Ser: 0.69 mg/dL (ref 0.40–1.20)
GFR: 105.62 mL/min (ref >60.00)
Glucose, Bld: 84 mg/dL (ref 70–99)
Phosphorus: 4.6 mg/dL (ref 2.3–4.6)
Potassium: 3.6 meq/L (ref 3.5–5.1)
Sodium: 141 meq/L (ref 135–145)

## 2023-05-16 LAB — IBC + FERRITIN
Ferritin: 21 ng/mL (ref 10.0–291.0)
Iron: 44 ug/dL (ref 42–145)
Saturation Ratios: 11.8 % — ABNORMAL LOW (ref 20.0–50.0)
TIBC: 372.4 ug/dL (ref 250.0–450.0)
Transferrin: 266 mg/dL (ref 212.0–360.0)

## 2023-05-16 LAB — LDL CHOLESTEROL, DIRECT: Direct LDL: 79 mg/dL

## 2023-05-16 LAB — HEMOGLOBIN A1C: Hgb A1c MFr Bld: 5.8 % (ref 4.6–6.5)

## 2023-05-16 MED ORDER — AMLODIPINE BESYLATE 10 MG PO TABS: 10.0000 mg | ORAL_TABLET | Freq: Every evening | ORAL | 3 refills | Status: AC

## 2023-05-16 MED ORDER — VALSARTAN 320 MG PO TABS
320.0000 mg | ORAL_TABLET | Freq: Every day | ORAL | 1 refills | Status: AC
Start: 2023-05-16 — End: ?

## 2023-05-16 MED ORDER — OZEMPIC (1 MG/DOSE) 4 MG/3ML ~~LOC~~ SOPN: 1.0000 mg | PEN_INJECTOR | SUBCUTANEOUS | 1 refills | Status: DC

## 2023-05-16 NOTE — Assessment & Plan Note (Signed)
 Check direct ldl

## 2023-05-16 NOTE — Patient Instructions (Addendum)
 Hold hydralazine  Increase valsartan  to 320mg  daily Maintain other med doses Maintain DASH diet Return iFOB sample to lab as soon as possible. Go to lab  DASH Eating Plan DASH stands for Dietary Approaches to Stop Hypertension. The DASH eating plan is a healthy eating plan that has been shown to: Lower high blood pressure (hypertension). Reduce your risk for type 2 diabetes, heart disease, and stroke. Help with weight loss. What are tips for following this plan? Reading food labels Check food labels for the amount of salt (sodium) per serving. Choose foods with less than 5 percent of the Daily Value (DV) of sodium. In general, foods with less than 300 milligrams (mg) of sodium per serving fit into this eating plan. To find whole grains, look for the word "whole" as the first word in the ingredient list. Shopping Buy products labeled as "low-sodium" or "no salt added." Buy fresh foods. Avoid canned foods and pre-made or frozen meals. Cooking Try not to add salt when you cook. Use salt-free seasonings or herbs instead of table salt or sea salt. Check with your health care provider or pharmacist before using salt substitutes. Do not fry foods. Cook foods in healthy ways, such as baking, boiling, grilling, roasting, or broiling. Cook using oils that are good for your heart. These include olive, canola, avocado, soybean, and sunflower oil. Meal planning  Eat a balanced diet. This should include: 4 or more servings of fruits and 4 or more servings of vegetables each day. Try to fill half of your plate with fruits and vegetables. 6-8 servings of whole grains each day. 6 or less servings of lean meat, poultry, or fish each day. 1 oz is 1 serving. A 3 oz (85 g) serving of meat is about the same size as the palm of your hand. One egg is 1 oz (28 g). 2-3 servings of low-fat dairy each day. One serving is 1 cup (237 mL). 1 serving of nuts, seeds, or beans 5 times each week. 2-3 servings of  heart-healthy fats. Healthy fats called omega-3 fatty acids are found in foods such as walnuts, flaxseeds, fortified milks, and eggs. These fats are also found in cold-water fish, such as sardines, salmon, and mackerel. Limit how much you eat of: Canned or prepackaged foods. Food that is high in trans fat, such as fried foods. Food that is high in saturated fat, such as fatty meat. Desserts and other sweets, sugary drinks, and other foods with added sugar. Full-fat dairy products. Do not salt foods before eating. Do not eat more than 4 egg yolks a week. Try to eat at least 2 vegetarian meals a week. Eat more home-cooked food and less restaurant, buffet, and fast food. Lifestyle When eating at a restaurant, ask if your food can be made with less salt or no salt. If you drink alcohol: Limit how much you have to: 0-1 drink a day if you are female. 0-2 drinks a day if you are female. Know how much alcohol is in your drink. In the [place] one drink is one 12 oz bottle of beer (355 mL), one 5 oz glass of wine (148 mL), or one 1 oz glass of hard liquor (44 mL). General information Avoid eating more than 2,300 mg of salt a day. If you have hypertension, you may need to reduce your sodium intake to 1,500 mg a day. Work with your provider to stay at a healthy body weight or lose weight. Ask what the best weight range is for  you. On most days of the week, get at least 30 minutes of exercise that causes your heart to beat faster. This may include walking, swimming, or biking. Work with your provider or dietitian to adjust your eating plan to meet your specific calorie needs. What foods should I eat? Fruits All fresh, dried, or frozen fruit. Canned fruits that are in their natural juice and do not have sugar added to them. Vegetables Fresh or frozen vegetables that are raw, steamed, roasted, or grilled. Low-sodium or reduced-sodium tomato and vegetable juice. Low-sodium or reduced-sodium tomato sauce and  tomato paste. Low-sodium or reduced-sodium canned vegetables. Grains Whole-grain or whole-wheat bread. Whole-grain or whole-wheat pasta. Brown rice. Dwyane Glad. Bulgur. Whole-grain and low-sodium cereals. Pita bread. Low-fat, low-sodium crackers. Whole-wheat flour tortillas. Meats and other proteins Skinless chicken or [place]. Ground chicken or [place]. Pork with fat trimmed off. Fish and seafood. Egg whites. Dried beans, peas, or lentils. Unsalted nuts, nut butters, and seeds. Unsalted canned beans. Lean cuts of beef with fat trimmed off. Low-sodium, lean precooked or cured meat, such as sausages or meat loaves. Dairy Low-fat (1%) or fat-free (skim) milk. Reduced-fat, low-fat, or fat-free cheeses. Nonfat, low-sodium ricotta or cottage cheese. Low-fat or nonfat yogurt. Low-fat, low-sodium cheese. Fats and oils Soft margarine without trans fats. Vegetable oil. Reduced-fat, low-fat, or light mayonnaise and salad dressings (reduced-sodium). Canola, safflower, olive, avocado, soybean, and sunflower oils. Avocado. Seasonings and condiments Herbs. Spices. Seasoning mixes without salt. Other foods Unsalted popcorn and pretzels. Fat-free sweets. The items listed above may not be all the foods and drinks you can have. Talk to a dietitian to learn more. What foods should I avoid? Fruits Canned fruit in a light or heavy syrup. Fried fruit. Fruit in cream or butter sauce. Vegetables Creamed or fried vegetables. Vegetables in a cheese sauce. Regular canned vegetables that are not marked as low-sodium or reduced-sodium. Regular canned tomato sauce and paste that are not marked as low-sodium or reduced-sodium. Regular tomato and vegetable juices that are not marked as low-sodium or reduced-sodium. Vanessa General. Olives. Grains Baked goods made with fat, such as croissants, muffins, or some breads. Dry pasta or rice meal packs. Meats and other proteins Fatty cuts of meat. Ribs. Fried meat. Helene Loader. Bologna,  salami, and other precooked or cured meats, such as sausages or meat loaves, that are not lean and low in sodium. Fat from the back of a pig (fatback). Bratwurst. Salted nuts and seeds. Canned beans with added salt. Canned or smoked fish. Whole eggs or egg yolks. Chicken or [place] with skin. Dairy Whole or 2% milk, cream, and half-and-half. Whole or full-fat cream cheese. Whole-fat or sweetened yogurt. Full-fat cheese. Nondairy creamers. Whipped toppings. Processed cheese and cheese spreads. Fats and oils Butter. Stick margarine. Lard. Shortening. Ghee. Bacon fat. Tropical oils, such as coconut, palm kernel, or palm oil. Seasonings and condiments Onion salt, garlic salt, seasoned salt, table salt, and sea salt. Worcestershire sauce. Tartar sauce. Barbecue sauce. Teriyaki sauce. Soy sauce, including reduced-sodium soy sauce. Steak sauce. Canned and packaged gravies. Fish sauce. Oyster sauce. Cocktail sauce. Store-bought horseradish. Ketchup. Mustard. Meat flavorings and tenderizers. Bouillon cubes. Hot sauces. Pre-made or packaged marinades. Pre-made or packaged taco seasonings. Relishes. Regular salad dressings. Other foods Salted popcorn and pretzels. The items listed above may not be all the foods and drinks you should avoid. Talk to a dietitian to learn more. Where to find more information National Heart, Lung, and Blood Institute (NHLBI): [place]DryCleaner.gl American Heart Association (AHA): heart.org Academy of  Nutrition and Dietetics: eatright.org National Kidney Foundation (NKF): kidney.org This information is not intended to replace advice given to you by your health care provider. Make sure you discuss any questions you have with your health care provider. Document Revised: 01/12/2022 Document Reviewed: 01/12/2022 Elsevier Patient Education  2024 [employer].

## 2023-05-16 NOTE — Assessment & Plan Note (Addendum)
 S/p hysterectomy Denies any blood in stool Check iron panel and iFOB.

## 2023-05-16 NOTE — Progress Notes (Signed)
 Established Patient Visit  Patient: Maria Quinn   DOB: Aug 26, 1980   43 y.o. Female  MRN: [id number] Visit Date: 05/16/2023  Subjective:    Chief Complaint  Patient presents with   Follow-up    4 week f/u    HPI Essential hypertension Improved BP but not at goal Compliant with amlodipine  and valsartan  dose. Only takes hydralazine  25mg  once daily Denies any adverse effects She has upcoming appointment for sleep study BP Readings from Last 3 Encounters:  05/16/23 (!) 132/90  04/12/23 (!) 150/110  03/16/23 (!) 150/100    Hold hydralazine  dose Increase valsartan  to 320mg  daily Maintain amlodipine  dose F/up in 1month  DM (diabetes mellitus) (HCC) Denies any adverse effects with ozempic  1mg  and metformin  Normal UACr LDL at goal Normal foot exam today  Repeat BMP and hgbA1c today Maintain med doses F/up in 11month  Anemia S/p hysterectomy Denies any blood in stool Check iron panel and iFOB.  Hyperlipidemia associated with type 2 diabetes mellitus (HCC) Check direct ldl  Reviewed medical, surgical, and social history today  Medications: Outpatient Medications Prior to Visit  Medication Sig   blood glucose meter kit and supplies Dispense based on patient and insurance preference. Use up to two times daily as directed. (FOR ICD-10 E10.9, E11.9). 1 year supplies   Cholecalciferol (VITAMIN D ) 50 MCG (2000 UT) CAPS Take by mouth daily.   cyclobenzaprine  (FLEXERIL ) 5 MG tablet Take 1 tablet (5 mg total) by mouth 3 (three) times daily as needed for muscle spasms.   meloxicam  (MOBIC ) 7.5 MG tablet Take 7.5 mg by mouth daily.   metFORMIN  (GLUCOPHAGE ) 500 MG tablet Take 1 tablet (500 mg total) by mouth daily with breakfast.   [DISCONTINUED] amLODipine  (NORVASC ) 10 MG tablet Take 1 tablet (10 mg total) by mouth every evening.   [DISCONTINUED] hydrALAZINE  (APRESOLINE ) 25 MG tablet Take 1 tablet (25 mg total) by mouth 2 (two) times daily.   [DISCONTINUED]  Semaglutide , 1 MG/DOSE, (OZEMPIC , 1 MG/DOSE,) 4 MG/3ML SOPN Inject 1 mg into the skin once a week.   [DISCONTINUED] valsartan  (DIOVAN ) 160 MG tablet Take 1 tablet (160 mg total) by mouth daily.   No facility-administered medications prior to visit.   Reviewed past medical and social history.   ROS per HPI above      Objective:  BP (!) 132/90 (BP Location: Left Arm, Patient Position: Sitting, Cuff Size: Normal)   Temp 98 F (36.7 C) (Temporal)   Ht 5\' 5"  (1.651 m)   Wt (!) 304 lb 3.2 oz (138 kg)   LMP 10/19/2010 Comment: No periods   BMI 50.62 kg/m      Physical Exam Vitals and nursing note reviewed.  Cardiovascular:     Rate and Rhythm: Normal rate and regular rhythm.     Pulses: Normal pulses.     Heart sounds: Normal heart sounds.  Pulmonary:     Effort: Pulmonary effort is normal.     Breath sounds: Normal breath sounds.  Musculoskeletal:     Right lower leg: Edema present.     Left lower leg: Edema present.  Neurological:     Mental Status: She is alert and oriented to person, place, and time.     No results found for any visits on 05/16/23.    Assessment & Plan:    Problem List Items Addressed This Visit     Anemia   S/p hysterectomy Denies any blood  in stool Check iron panel and iFOB.      Relevant Orders   Fecal occult blood, imunochemical   IBC + Ferritin   DM (diabetes mellitus) (HCC)   Denies any adverse effects with ozempic  1mg  and metformin  Normal UACr LDL at goal Normal foot exam today  Repeat BMP and hgbA1c today Maintain med doses F/up in 572month      Relevant Medications   valsartan  (DIOVAN ) 320 MG tablet   Semaglutide , 1 MG/DOSE, (OZEMPIC , 1 MG/DOSE,) 4 MG/3ML SOPN   Other Relevant Orders   Renal Function Panel   Hemoglobin A1c   Essential hypertension - Primary   Improved BP but not at goal Compliant with amlodipine  and valsartan  dose. Only takes hydralazine  25mg  once daily Denies any adverse effects She has upcoming  appointment for sleep study BP Readings from Last 3 Encounters:  05/16/23 (!) 132/90  04/12/23 (!) 150/110  03/16/23 (!) 150/100    Hold hydralazine  dose Increase valsartan  to 320mg  daily Maintain amlodipine  dose F/up in 72month      Relevant Medications   valsartan  (DIOVAN ) 320 MG tablet   amLODipine  (NORVASC ) 10 MG tablet   Other Relevant Orders   Renal Function Panel   Hyperlipidemia associated with type 2 diabetes mellitus (HCC)   Check direct ldl      Relevant Medications   valsartan  (DIOVAN ) 320 MG tablet   Semaglutide , 1 MG/DOSE, (OZEMPIC , 1 MG/DOSE,) 4 MG/3ML SOPN   amLODipine  (NORVASC ) 10 MG tablet   Other Relevant Orders   Direct LDL   Return in about 4 weeks (around 06/13/2023) for HTN.     Kathrene Parents, NP

## 2023-05-16 NOTE — Assessment & Plan Note (Signed)
 Improved BP but not at goal Compliant with amlodipine  and valsartan  dose. Only takes hydralazine  25mg  once daily Denies any adverse effects She has upcoming appointment for sleep study BP Readings from Last 3 Encounters:  05/16/23 (!) 132/90  04/12/23 (!) 150/110  03/16/23 (!) 150/100    Hold hydralazine  dose Increase valsartan  to 320mg  daily Maintain amlodipine  dose F/up in 53month

## 2023-05-16 NOTE — Assessment & Plan Note (Signed)
 Denies any adverse effects with ozempic  1mg  and metformin  Normal UACr LDL at goal Normal foot exam today  Repeat BMP and hgbA1c today Maintain med doses F/up in 18month

## 2023-05-18 LAB — FECAL OCCULT BLOOD, IMMUNOCHEMICAL: Fecal Occult Bld: NEGATIVE

## 2023-05-22 ENCOUNTER — Ambulatory Visit: Payer: Self-pay | Admitting: Nurse Practitioner

## 2023-05-22 DIAGNOSIS — D509 Iron deficiency anemia, unspecified: Secondary | ICD-10-CM

## 2023-05-23 ENCOUNTER — Telehealth: Payer: Self-pay | Admitting: Physician Assistant

## 2023-05-23 NOTE — Telephone Encounter (Signed)
 Pt called about update for gel injection. Please call this pt about this matter at [phone].

## 2023-05-24 ENCOUNTER — Telehealth: Payer: Self-pay

## 2023-05-24 NOTE — Telephone Encounter (Signed)
 Talked with [facility] and PA is pending. Faxed office notes to [phone]. Pending auth.# [id number]

## 2023-05-24 NOTE — Telephone Encounter (Signed)
Talked with patient and appt.has been scheduled for gel injection.  

## 2023-05-28 ENCOUNTER — Encounter: Payer: Self-pay | Admitting: Physician Assistant

## 2023-05-28 ENCOUNTER — Ambulatory Visit: Admitting: Physician Assistant

## 2023-05-28 DIAGNOSIS — M170 Bilateral primary osteoarthritis of knee: Secondary | ICD-10-CM | POA: Diagnosis not present

## 2023-05-28 MED ORDER — HYALURONAN 88 MG/4ML IX SOSY
22.0000 mg | PREFILLED_SYRINGE | Freq: Once | INTRA_ARTICULAR | Status: DC
Start: 2023-05-28 — End: 2023-05-28

## 2023-05-28 MED ORDER — HYALURONAN 88 MG/4ML IX SOSY
88.0000 mg | PREFILLED_SYRINGE | INTRA_ARTICULAR | Status: AC | PRN
  Administered 2023-05-28: 88 mg via INTRA_ARTICULAR

## 2023-05-28 MED ORDER — HYALURONAN 88 MG/4ML IX SOSY: 22.0000 mg | PREFILLED_SYRINGE | Freq: Once | INTRA_ARTICULAR | Status: DC

## 2023-05-28 MED ORDER — HYALURONAN 88 MG/4ML IX SOSY
88.0000 mg | PREFILLED_SYRINGE | INTRA_ARTICULAR | Status: AC | PRN
Start: 2023-05-28 — End: 2023-05-28
  Administered 2023-05-28: 88 mg via INTRA_ARTICULAR

## 2023-05-28 NOTE — Addendum Note (Signed)
 Addended by: Georgann Kim on: 05/28/2023 01:35 PM   Modules accepted: Orders

## 2023-05-28 NOTE — Progress Notes (Signed)
 Office Visit Note   Patient: Maria Quinn           Date of Birth: [birth year]           MRN: [id number] Visit Date: 05/28/2023              Requested by: Kandace Organ, NP [id number] [place],  [place] [address] PCP: Kandace Organ, NP  Osteoarthritis bilateral knees    HPI: Patient is a pleasant 43 year old woman with a history of tricompartmental arthritis in her knees here for Monovisc injection  Assessment & Plan: Visit Diagnoses:  1. Primary osteoarthritis of both knees     Plan: The risk side effects of the medication were explained to her as well as effectiveness.  Tolerated the injections well may follow-up as needed  Follow-Up Instructions: Return if symptoms worsen or fail to improve.   Ortho Exam  Patient is alert, oriented, no adenopathy, well-dressed, normal affect, normal respiratory effort. Bilateral knees no effusion no erythema compartments are soft and compressible she is neurovascular intact.  Imaging: No results found. No images are attached to the encounter.  Labs: Lab Results  Component Value Date   HGBA1C 5.8 05/16/2023   HGBA1C 6.2 02/07/2023   HGBA1C 6.2 10/26/2022   ESRSEDRATE 55 (H) 08/27/2018   REPTSTATUS 11/02/2010 FINAL 10/29/2010   REPTSTATUS 11/02/2010 FINAL 10/29/2010   REPTSTATUS 11/01/2010 FINAL 10/29/2010   REPTSTATUS 11/01/2010 FINAL 10/29/2010   GRAMSTAIN  10/29/2010    WBC PRESENT,BOTH PMN AND MONONUCLEAR NO ORGANISMS SEEN   GRAMSTAIN  10/29/2010    WBC PRESENT,BOTH PMN AND MONONUCLEAR NO ORGANISMS SEEN   GRAMSTAIN  10/29/2010    WBC PRESENT,BOTH PMN AND MONONUCLEAR NO ORGANISMS SEEN   GRAMSTAIN  10/29/2010    WBC PRESENT,BOTH PMN AND MONONUCLEAR NO ORGANISMS SEEN   CULT NO ANAEROBES ISOLATED 10/29/2010   CULT NO ANAEROBES ISOLATED 10/29/2010   CULT NO GROWTH 3 DAYS 10/29/2010   CULT NO GROWTH 3 DAYS 10/29/2010     Lab Results  Component Value Date   ALBUMIN 4.0 05/16/2023   ALBUMIN 4.3  02/07/2023   ALBUMIN 4.0 11/17/2022   PREALBUMIN 27 04/13/2020    Lab Results  Component Value Date   MG 1.7 11/06/2010   MG 1.9 10/25/2010   MG 1.8 10/24/2010   Lab Results  Component Value Date   VD25OH 30.4 03/09/2021   VD25OH 41.9 09/14/2020   VD25OH 25.7 (L) 04/13/2020    Lab Results  Component Value Date   PREALBUMIN 27 04/13/2020      Latest Ref Rng & Units 02/07/2023   11:50 AM 01/17/2022   11:34 AM 04/13/2020    9:01 AM  CBC EXTENDED  WBC 4.0 - 10.5 K/uL 6.8  4.8  6.2   RBC 3.87 - 5.11 Mil/uL 5.31  5.35  5.41   Hemoglobin 12.0 - 15.0 g/dL 52.8  41.3  24.4   HCT 36.0 - 46.0 % 38.0  39.6  41.3   Platelets 150.0 - 400.0 K/uL 263.0  210.0  240   NEUT# 1.4 - 7.7 K/uL 4.1  2.3  3.8   Lymph# 0.7 - 4.0 K/uL 1.7  1.8  1.5      There is no height or weight on file to calculate BMI.  Orders:  No orders of the defined types were placed in this encounter.  Meds ordered this encounter  Medications  . Hyaluronan (MONOVISC) intra-articular injection 22 mg  . Hyaluronan (MONOVISC) intra-articular injection 22 mg  Procedures: Large Joint Inj: bilateral knee on 05/28/2023 1:26 PM Indications: pain and diagnostic evaluation Details: 22 G 1.5 in needle, anteromedial approach  Arthrogram: No  Medications (Right): 88 mg Hyaluronan 88 MG/4ML Medications (Left): 88 mg Hyaluronan 88 MG/4ML Outcome: tolerated well, no immediate complications Procedure, treatment alternatives, risks and benefits explained, specific risks discussed. Consent was given by the patient.    Clinical Data: No additional findings.  ROS:  All other systems negative, except as noted in the HPI. Review of Systems  Objective: Vital Signs: LMP 10/19/2010 Comment: No periods   Specialty Comments:  No specialty comments available.  PMFS History: Patient Active Problem List   Diagnosis Date Noted  . Hyperlipidemia associated with type 2 diabetes mellitus (HCC) 05/16/2023  . Spontaneous  bruising 02/07/2023  . Anterior shoulder pain 08/04/2022  . S/P total gastrectomy and Roux-en-Y esophagojejunal anastomosis 01/17/2022  . LVH (left ventricular hypertrophy) 04/30/2020  . Abnormal EKG 04/30/2020  . Cervicalgia 01/17/2019  . Chronic low back pain 01/17/2019  . Depression 08/13/2017  . Anxiety 05/01/2017  . OSA (obstructive sleep apnea) 04/23/2017  . Fatty liver 04/23/2017  . Anemia 04/23/2017  . Essential hypertension 07/28/2015  . DM (diabetes mellitus) (HCC) 07/28/2015  . Primary osteoarthritis of both knees 07/28/2015   Past Medical History:  Diagnosis Date  . Anemia   . Anxiety   . Arthritis   . Back pain   . Chicken pox   . Depression   . Exocrine pancreatic insufficiency   . Gastric bypass for obesity complicating pregnancy, birth, puerperium   . Hyperlipidemia   . Hypertension   . Hypertension associated with diabetes (HCC)   . OSA (obstructive sleep apnea)   . Other fatigue   . Pain in both feet 11/15/2020  . Pre-diabetes   . Rheumatoid arthritis (HCC)   . Right-sided face pain 08/27/2018  . Sleep apnea   . SOB (shortness of breath) on exertion   . Swelling of both lower extremities   . Vitamin D  deficiency     Family History  Problem Relation Age of Onset  . Depression Mother   . Diabetes Mother   . Hypertension Mother   . Miscarriages / [place] Mother   . Cancer Father        prostate.[email or web address]  . Diabetes Father   . Heart disease Father   . Hyperlipidemia Father   . Hypertension Father   . Kidney disease Father   . Stroke Father   . Depression Sister   . Alcohol abuse Brother   . Cancer Brother        lung   . Drug abuse Brother   . Arthritis Maternal Grandmother   . Diabetes Maternal Grandmother   . Hypertension Maternal Grandmother   . Stroke Maternal Grandmother   . Alcohol abuse Maternal Grandfather   . Stroke Paternal Grandmother   . Cancer Paternal Grandfather        ? type  . Alcohol abuse Brother   . Early death  Brother     Past Surgical History:  Procedure Laterality Date  . ABDOMINAL HYSTERECTOMY     Dr. Merline Starr 2012   . COLONOSCOPY WITH PROPOFOL  N/A 01/01/2019   Procedure: COLONOSCOPY WITH PROPOFOL ;  Surgeon: Irby Mannan, MD;  Location: [facility] END[facility];  Service: Endoscopy;  Laterality: N/A;  . ESOPHAGOGASTRODUODENOSCOPY (EGD) WITH PROPOFOL  N/A 01/01/2019   Procedure: ESOPHAGOGASTRODUODENOSCOPY (EGD) WITH PROPOFOL ;  Surgeon: Irby Mannan, MD;  Location: [facility] ENDOSCOPY;  Service: Endoscopy;  Laterality: N/A;  .  OOPHORECTOMY     43 y.o 1 ovary out per pt, later in life 2nd ovary out   . ROUX-EN-Y GASTRIC BYPASS     2010 losst down to 180s gained back   . TONSILLECTOMY     1987   Social History   Occupational History  . Occupation: Teacher, adult education  Tobacco Use  . Smoking status: Never  . Smokeless tobacco: Never  Vaping Use  . Vaping status: Never Used  Substance and Sexual Activity  . Alcohol use: Yes    Alcohol/week: 2.0 - 3.0 standard drinks of alcohol    Types: 2 - 3 Glasses of wine per week  . Drug use: No  . Sexual activity: Yes    Comment: men

## 2023-05-29 NOTE — Telephone Encounter (Signed)
 Called patient and informed her that the test she had done previously and the MRI are different test. I informed her that I will ask Soyla Duverney if she still need to have the MRI done and give her a call back. She stated that she is still having some pain.

## 2023-05-29 NOTE — Telephone Encounter (Signed)
 Called patient with the comment(s) from [place]. She stated that she will wait for a call from neurosurgery. I thanked her for taking my call and stated to give our office a call and/or send a MyChart message if she has not heard from their office or if she has any questions.

## 2023-06-05 NOTE — Progress Notes (Unsigned)
 [facility]  [phone]   NEW PATIENT HEMATOLOGY CONSULTATION   Maria Quinn  DOB: 1980-09-30  MR#: [id number]  CSN#: [id number]    Requesting Physician: [facility]  Patient Care Team: Kandace Organ, NP as PCP - General (Internal Medicine)  Reason for consult: microcytic anemia   History of present illness:     MEDICAL HISTORY:  Past Medical History:  Diagnosis Date   Anemia    Anxiety    Arthritis    Back pain    Chicken pox    Depression    Exocrine pancreatic insufficiency    Gastric bypass for obesity complicating pregnancy, birth, puerperium    Hyperlipidemia    Hypertension    Hypertension associated with diabetes (HCC)    OSA (obstructive sleep apnea)    Other fatigue    Pain in both feet 11/15/2020   Pre-diabetes    Rheumatoid arthritis (HCC)    Right-sided face pain 08/27/2018   Sleep apnea    SOB (shortness of breath) on exertion    Swelling of both lower extremities    Vitamin D  deficiency     SURGICAL HISTORY: Past Surgical History:  Procedure Laterality Date   ABDOMINAL HYSTERECTOMY     Dr. Merline Starr 2012    COLONOSCOPY WITH PROPOFOL  N/A 01/01/2019   Procedure: COLONOSCOPY WITH PROPOFOL ;  Surgeon: Irby Mannan, MD;  Location: [facility];  Service: Endoscopy;  Laterality: N/A;   ESOPHAGOGASTRODUODENOSCOPY (EGD) WITH PROPOFOL  N/A 01/01/2019   Procedure: ESOPHAGOGASTRODUODENOSCOPY (EGD) WITH PROPOFOL ;  Surgeon: Irby Mannan, MD;  Location: [facility];  Service: Endoscopy;  Laterality: N/A;   OOPHORECTOMY     43 y.o 1 ovary out per pt, later in life 2nd ovary out    ROUX-EN-Y GASTRIC BYPASS     2010 losst down to 180s gained back    TONSILLECTOMY     1987    SOCIAL HISTORY: Social History   Socioeconomic History   Marital status: Single    Spouse name: Not on file   Number of children: Not on file   Years of education: Not on file   Highest education level: 12th grade   Occupational History   Occupation: Teacher, adult education  Tobacco Use   Smoking status: Never   Smokeless tobacco: Never  Vaping Use   Vaping status: Never Used  Substance and Sexual Activity   Alcohol use: Yes    Alcohol/week: 2.0 - 3.0 standard drinks of alcohol    Types: 2 - 3 Glasses of wine per week   Drug use: No   Sexual activity: Yes    Comment: men  Other Topics Concern   Not on file  Social History Narrative   Some college   Entertainer    Single    No kids    Wears seat belt, safe in relationship    Will start Job 09/16/2018 [employer] in [place] 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    Social Drivers of Corporate investment banker Strain: Low Risk  (04/11/2023)   Overall Financial Resource Strain (CARDIA)    Difficulty of Paying Living Expenses: Not hard at all  Food Insecurity: No Food Insecurity (04/11/2023)   Hunger Vital Sign    Worried About Running Out of Food in the Last Year: Never true    Ran Out of Food in the Last Year: Never true  Transportation Needs: No Transportation Needs (04/11/2023)   PRAPARE -  Administrator, Civil Service (Medical): No    Lack of Transportation (Non-Medical): No  Physical Activity: Insufficiently Active (04/11/2023)   Exercise Vital Sign    Days of Exercise per Week: 3 days    Minutes of Exercise per Session: 30 min  Stress: Stress Concern Present (04/11/2023)   [employer] of Occupational Health - Occupational Stress Questionnaire    Feeling of Stress : To some extent  Social Connections: Moderately Integrated (04/11/2023)   Social Connection and Isolation Panel [NHANES]    Frequency of Communication with Friends and Family: Twice a week    Frequency of Social Gatherings with Friends and Family: More than three times a week    Attends Religious Services: More than 4 times per year    Active Member of [employer] or Organizations: Yes    Attends Engineer, structural: More than 4 times  per year    Marital Status: Never married  Catering manager Violence: Not on file    FAMILY HISTORY: Family History  Problem Relation Age of Onset   Depression Mother    Diabetes Mother    Hypertension Mother    Miscarriages / [place] Mother    Cancer Father        prostate.[email or web address]   Diabetes Father    Heart disease Father    Hyperlipidemia Father    Hypertension Father    Kidney disease Father    Stroke Father    Depression Sister    Alcohol abuse Brother    Cancer Brother        lung    Drug abuse Brother    Arthritis Maternal Grandmother    Diabetes Maternal Grandmother    Hypertension Maternal Grandmother    Stroke Maternal Grandmother    Alcohol abuse Maternal Grandfather    Stroke Paternal Grandmother    Cancer Paternal Grandfather        ? type   Alcohol abuse Brother    Early death Brother     ALLERGIES:  is allergic to penicillins.  MEDICATIONS:  Current Outpatient Medications  Medication Sig Dispense Refill   amLODipine  (NORVASC ) 10 MG tablet Take 1 tablet (10 mg total) by mouth every evening. 90 tablet 3   blood glucose meter kit and supplies Dispense based on patient and insurance preference. Use up to two times daily as directed. (FOR ICD-10 E10.9, E11.9). 1 year supplies 1 each 0   Cholecalciferol (VITAMIN D ) 50 MCG (2000 UT) CAPS Take by mouth daily.     cyclobenzaprine  (FLEXERIL ) 5 MG tablet Take 1 tablet (5 mg total) by mouth 3 (three) times daily as needed for muscle spasms. 30 tablet 0   meloxicam  (MOBIC ) 7.5 MG tablet Take 7.5 mg by mouth daily.     metFORMIN  (GLUCOPHAGE ) 500 MG tablet Take 1 tablet (500 mg total) by mouth daily with breakfast. 90 tablet 1   Semaglutide , 1 MG/DOSE, (OZEMPIC , 1 MG/DOSE,) 4 MG/3ML SOPN Inject 1 mg into the skin once a week. 9 mL 1   valsartan  (DIOVAN ) 320 MG tablet Take 1 tablet (320 mg total) by mouth daily. 90 tablet 1   No current facility-administered medications for this visit.    REVIEW OF SYSTEMS:    Constitutional: Denies fevers, chills or abnormal night sweats Eyes: Denies blurriness of vision, double vision or watery eyes Ears, nose, mouth, throat, and face: Denies mucositis or sore throat Respiratory: Denies cough, dyspnea or wheezes Cardiovascular: Denies palpitation, chest discomfort or lower extremity swelling Gastrointestinal:  Denies nausea, heartburn or change in bowel habits Skin: Denies abnormal skin rashes Lymphatics: Denies new lymphadenopathy or easy bruising Neurological:Denies numbness, tingling or new weaknesses Behavioral/Psych: Mood is stable, no new changes  All other systems were reviewed with the patient and are negative.  PHYSICAL EXAMINATION: ECOG PERFORMANCE STATUS: {CHL ONC ECOG [phone]}  There were no vitals filed for this visit. There were no vitals filed for this visit.  GENERAL:alert, no distress and comfortable SKIN: skin color, texture, turgor are normal, no rashes or significant lesions EYES: normal, conjunctiva are pink and non-injected, sclera clear OROPHARYNX:no exudate, no erythema and lips, buccal mucosa, and tongue normal  NECK: supple, thyroid normal size, non-tender, without nodularity LYMPH:  no palpable lymphadenopathy in the cervical, axillary or inguinal LUNGS: clear to auscultation and percussion with normal breathing effort HEART: regular rate & rhythm and no murmurs and no lower extremity edema ABDOMEN:abdomen soft, non-tender and normal bowel sounds Musculoskeletal:no cyanosis of digits and no clubbing  PSYCH: alert & oriented x 3 with fluent speech NEURO: no focal motor/sensory deficits  LABORATORY DATA:  I have reviewed the data as listed Lab Results  Component Value Date   WBC 6.8 02/07/2023   HGB 11.9 (L) 02/07/2023   HCT 38.0 02/07/2023   MCV 71.6 (L) 02/07/2023   PLT 263.0 02/07/2023   Recent Labs    10/26/22 0838 11/17/22 1110 02/07/23 1150 05/16/23 0836  NA 138 142 141 141  K 4.4 4.0 3.8 3.6  CL 102  106 102 103  CO2 30 29 31 31   GLUCOSE 97 89 81 84  BUN 18 16 16 11   CREATININE 1.33* 0.78 0.77 0.69  CALCIUM 9.4 8.8 9.2 8.4  PROT 7.2  --  7.6  --   ALBUMIN 4.4 4.0 4.3 4.0  AST 25  --  12  --   ALT 22  --  11  --   ALKPHOS 93  --  63  --   BILITOT 1.2  --  0.5  --     RADIOGRAPHIC STUDIES: I have personally reviewed the radiological images as listed and agreed with the findings in the report. No results found.  ASSESSMENT & PLAN:  ***  Recommendations:   All questions were answered. The patient knows to call the clinic with any problems, questions or concerns.      Sharyon Deis, NP 06/05/2023 5:33 PM

## 2023-06-06 ENCOUNTER — Telehealth: Payer: Self-pay

## 2023-06-06 ENCOUNTER — Inpatient Hospital Stay: Attending: Nurse Practitioner

## 2023-06-06 ENCOUNTER — Inpatient Hospital Stay: Admitting: Nurse Practitioner

## 2023-06-06 VITALS — BP 200/140 | HR 64 | Temp 97.5°F | Resp 17 | Wt 305.5 lb

## 2023-06-06 DIAGNOSIS — D649 Anemia, unspecified: Secondary | ICD-10-CM | POA: Diagnosis present

## 2023-06-06 DIAGNOSIS — Z9884 Bariatric surgery status: Secondary | ICD-10-CM | POA: Insufficient documentation

## 2023-06-06 DIAGNOSIS — Z90710 Acquired absence of both cervix and uterus: Secondary | ICD-10-CM | POA: Insufficient documentation

## 2023-06-06 LAB — CBC WITH DIFFERENTIAL (CANCER CENTER ONLY)
Abs Immature Granulocytes: 0.01 10*3/uL (ref 0.00–0.07)
Basophils Absolute: 0 10*3/uL (ref 0.0–0.1)
Basophils Relative: 0 %
Eosinophils Absolute: 0 10*3/uL (ref 0.0–0.5)
Eosinophils Relative: 1 %
HCT: 37.8 % (ref 36.0–46.0)
Hemoglobin: 11.6 g/dL — ABNORMAL LOW (ref 12.0–15.0)
Immature Granulocytes: 0 %
Lymphocytes Relative: 35 %
Lymphs Abs: 1.6 10*3/uL (ref 0.7–4.0)
MCH: 22.6 pg — ABNORMAL LOW (ref 26.0–34.0)
MCHC: 30.7 g/dL (ref 30.0–36.0)
MCV: 73.7 fL — ABNORMAL LOW (ref 80.0–100.0)
Monocytes Absolute: 0.6 10*3/uL (ref 0.1–1.0)
Monocytes Relative: 13 %
Neutro Abs: 2.3 10*3/uL (ref 1.7–7.7)
Neutrophils Relative %: 51 %
Platelet Count: 232 10*3/uL (ref 150–400)
RBC: 5.13 MIL/uL — ABNORMAL HIGH (ref 3.87–5.11)
RDW: 17.3 % — ABNORMAL HIGH (ref 11.5–15.5)
WBC Count: 4.5 10*3/uL (ref 4.0–10.5)
nRBC: 0 % (ref 0.0–0.2)

## 2023-06-06 LAB — IRON AND IRON BINDING CAPACITY (CC-WL,HP ONLY)
Iron: 52 ug/dL (ref 28–170)
Saturation Ratios: 13 % (ref 10.4–31.8)
TIBC: 391 ug/dL (ref 250–450)
UIBC: 339 ug/dL (ref 148–442)

## 2023-06-06 LAB — VITAMIN B12: Vitamin B-12: 197 pg/mL (ref 180–914)

## 2023-06-06 LAB — FOLATE: Folate: 24.1 ng/mL (ref >5.9)

## 2023-06-06 LAB — RETICULOCYTES
Immature Retic Fract: 14.6 % (ref 2.3–15.9)
RBC.: 5.18 MIL/uL — ABNORMAL HIGH (ref 3.87–5.11)
Retic Count, Absolute: 69.4 10*3/uL (ref 19.0–186.0)
Retic Ct Pct: 1.3 % (ref 0.4–3.1)

## 2023-06-06 LAB — FERRITIN: Ferritin: 31 ng/mL (ref 11–307)

## 2023-06-06 NOTE — Assessment & Plan Note (Addendum)
 Patient referred from primary care. CBC from 02/07/2023 sowed Hgb 11.9 with normal HCT 38.0. MCV low at 71.6. subsequent iron panel performed 05/16/2023. Iron normal at 44, transferrin 268.0, sat ratio low at 11.8, and ferritin low at 21.0. FOBT testing was negative. Was not recommended that she take oral iron supplement. She has had hysterectomy in the past. She had colonoscopy in 2020. She did have diverticulosis in the sigmoid colon. Remainder of the exam was normal. She does have history of gastric bypass surgery in 2010. She did have normal Hgb 12.2 and Hct 39.6 in 1/2076m however, she was mildly microcytic with MCV 73.9. Reviewing labs from 4, 5, and 6 years ago, she routinely had microcytic anemia which was very mild.  Discussed potential causes of her mild anemia. With history of gastric bypass (roux-en-y), she may have malabsorption. Will check CBC, iron panel, ferritin, B12, and ferritin. She may also have thalassemia, or be a carrier for this. Will check hemoglobin fractionation cascade to evaluate for beta thalassemia. If negative, will then evaluate for alpha thalassemia. She is also at mild risk for anemia of chronic disease. She does have reported history of blood pressure medications effecting kidney functions. Most recent blood work done at primary care in 04/2023, showed normal kidney functions.  Plan to contact patient with results of blood work and plan for further testing and/or treatment in the future. She voiced understanding and agreement with the plan.

## 2023-06-06 NOTE — Telephone Encounter (Signed)
 Per [facility], patient was denied for Monovisc, bilateral knee due to no trying steroid injections.

## 2023-06-10 LAB — HGB FRACTIONATION CASCADE
Hgb A2: 2.4 % (ref 1.8–3.2)
Hgb A: 97.6 % (ref 96.4–98.8)
Hgb F: 0 % (ref 0.0–2.0)
Hgb S: 0 %

## 2023-06-14 ENCOUNTER — Telehealth: Payer: Self-pay | Admitting: Nurse Practitioner

## 2023-06-14 ENCOUNTER — Encounter: Payer: Self-pay | Admitting: Nurse Practitioner

## 2023-06-14 ENCOUNTER — Ambulatory Visit: Admitting: Nurse Practitioner

## 2023-06-14 NOTE — Telephone Encounter (Signed)
 Same day cancellation for an OV with [facility]. 06/14/23

## 2023-06-14 NOTE — Telephone Encounter (Signed)
 Copied from CRM [id number]. Topic: Appointments - Appointment Cancel/Reschedule >> Jun 14, 2023 10:34 AM Maria Quinn wrote: Patient called to cancel appt today for 10:40am, says she won't make it until 11am, rescheduled to tomorrow 06/15/23

## 2023-06-15 ENCOUNTER — Ambulatory Visit (INDEPENDENT_AMBULATORY_CARE_PROVIDER_SITE_OTHER): Admitting: Nurse Practitioner

## 2023-06-15 ENCOUNTER — Encounter: Payer: Self-pay | Admitting: Nurse Practitioner

## 2023-06-15 ENCOUNTER — Other Ambulatory Visit: Payer: Self-pay

## 2023-06-15 VITALS — BP 160/100 | HR 66 | Temp 98.4°F | Ht 65.0 in | Wt 306.4 lb

## 2023-06-15 DIAGNOSIS — I10 Essential (primary) hypertension: Secondary | ICD-10-CM

## 2023-06-15 DIAGNOSIS — L239 Allergic contact dermatitis, unspecified cause: Secondary | ICD-10-CM | POA: Diagnosis not present

## 2023-06-15 MED ORDER — SPIRONOLACTONE 25 MG PO TABS
25.0000 mg | ORAL_TABLET | Freq: Every day | ORAL | 5 refills | Status: DC
Start: 2023-06-15 — End: 2023-07-26

## 2023-06-15 MED ORDER — TRIAMCINOLONE ACETONIDE 0.1 % EX CREA: 1.0000 | TOPICAL_CREAM | Freq: Two times a day (BID) | CUTANEOUS | 2 refills | Status: AC

## 2023-06-15 NOTE — Progress Notes (Signed)
 Established Patient Visit  Patient: Maria Quinn   DOB: Sep 14, 1980   43 y.o. Female  MRN: [id number] Visit Date: 06/15/2023  Subjective:     Chief Complaint  Patient presents with   Follow-up    4 week follow up for HTN    Rash    Bilateral side of neck with small bumps for 2-3 weeks    Rash This is a new problem. The current episode started in the past 7 days. The problem is unchanged. The affected locations include the neck. The rash is characterized by itchiness and redness. Associated with: new jewelry. Pertinent negatives include no eye pain, joint pain, rhinorrhea, shortness of breath or sore throat. Past treatments include moisturizer. The treatment provided mild relief. There is no history of allergies, asthma, eczema or varicella.   Essential hypertension BP not at goal despite med compliance-amlodiping 10mg  and valsartan  320mg  Denies any adverse effects She has upcoming appointment for sleep study BP Readings from Last 3 Encounters:  06/15/23 (!) 160/100  06/06/23 (!) 200/140  05/16/23 (!) 132/90    She agreed to start spironolactone  25mg  daily Maintain amlodipine  and valsartan  dose Repeat BMP in 2weeks F/up in 44month  Wt Readings from Last 3 Encounters:  06/15/23 (!) 306 lb 6.4 oz (139 kg)  06/06/23 (!) 305 lb 8 oz (138.6 kg)  05/16/23 (!) 304 lb 3.2 oz (138 kg)    Reviewed medical, surgical, and social history today  Medications: Outpatient Medications Prior to Visit  Medication Sig   amLODipine  (NORVASC ) 10 MG tablet Take 1 tablet (10 mg total) by mouth every evening.   blood glucose meter kit and supplies Dispense based on patient and insurance preference. Use up to two times daily as directed. (FOR ICD-10 E10.9, E11.9). 1 year supplies   Cholecalciferol (VITAMIN D ) 50 MCG (2000 UT) CAPS Take by mouth daily.   cyclobenzaprine  (FLEXERIL ) 5 MG tablet Take 1 tablet (5 mg total) by mouth 3 (three) times daily as needed for muscle spasms.    meloxicam  (MOBIC ) 7.5 MG tablet Take 7.5 mg by mouth daily.   metFORMIN  (GLUCOPHAGE ) 500 MG tablet Take 1 tablet (500 mg total) by mouth daily with breakfast.   Semaglutide , 1 MG/DOSE, (OZEMPIC , 1 MG/DOSE,) 4 MG/3ML SOPN Inject 1 mg into the skin once a week.   valsartan  (DIOVAN ) 320 MG tablet Take 1 tablet (320 mg total) by mouth daily.   No facility-administered medications prior to visit.   Reviewed past medical and social history.   ROS per HPI above      Objective:  BP (!) 160/100 (BP Location: Left Arm, Patient Position: Sitting, Cuff Size: Large)   Pulse 66   Temp 98.4 F (36.9 C) (Temporal)   Ht 5\' 5"  (1.651 m)   Wt (!) 306 lb 6.4 oz (139 kg)   LMP 10/19/2010 Comment: No periods   BMI 50.99 kg/m      Physical Exam Vitals and nursing note reviewed.  Cardiovascular:     Rate and Rhythm: Normal rate and regular rhythm.     Pulses: Normal pulses.     Heart sounds: Normal heart sounds.  Pulmonary:     Effort: Pulmonary effort is normal.     Breath sounds: Normal breath sounds.  Musculoskeletal:     Right lower leg: No edema.     Left lower leg: No edema.  Skin:    Findings: Rash present.  Neurological:  Mental Status: She is alert and oriented to person, place, and time.     No results found for any visits on 06/15/23.    Assessment & Plan:    Problem List Items Addressed This Visit     Essential hypertension - Primary   BP not at goal despite med compliance-amlodiping 10mg  and valsartan  320mg  Denies any adverse effects She has upcoming appointment for sleep study BP Readings from Last 3 Encounters:  06/15/23 (!) 160/100  06/06/23 (!) 200/140  05/16/23 (!) 132/90    She agreed to start spironolactone  25mg  daily Maintain amlodipine  and valsartan  dose Repeat BMP in 2weeks F/up in 44month      Relevant Medications   spironolactone  (ALDACTONE ) 25 MG tablet   Other Relevant Orders   Renal Function Panel   Other Visit Diagnoses       Allergic  dermatitis       Relevant Medications   triamcinolone  cream (KENALOG) 0.1 %      Return in about 4 weeks (around 07/13/2023) for HTN (complete ECG).     Kathrene Parents, NP

## 2023-06-15 NOTE — Patient Instructions (Signed)
 Schedule lab appointment to repeat renal function in 2weeks. Ok to drink water and take BP meds. Start spironolactone  25mg  daily Maintain Heart healthy diet and daily exercise. Maintain other med doses

## 2023-06-15 NOTE — Assessment & Plan Note (Signed)
 BP not at goal despite med compliance-amlodiping 10mg  and valsartan  320mg  Denies any adverse effects She has upcoming appointment for sleep study BP Readings from Last 3 Encounters:  06/15/23 (!) 160/100  06/06/23 (!) 200/140  05/16/23 (!) 132/90    She agreed to start spironolactone  25mg  daily Maintain amlodipine  and valsartan  dose Repeat BMP in 2weeks F/up in 74month

## 2023-06-25 ENCOUNTER — Other Ambulatory Visit: Payer: Self-pay

## 2023-06-25 DIAGNOSIS — M170 Bilateral primary osteoarthritis of knee: Secondary | ICD-10-CM

## 2023-06-29 ENCOUNTER — Ambulatory Visit: Payer: Self-pay | Admitting: Nurse Practitioner

## 2023-06-29 ENCOUNTER — Other Ambulatory Visit

## 2023-06-29 DIAGNOSIS — I10 Essential (primary) hypertension: Secondary | ICD-10-CM | POA: Diagnosis not present

## 2023-06-29 LAB — RENAL FUNCTION PANEL
Albumin: 4.4 g/dL (ref 3.5–5.2)
BUN: 19 mg/dL (ref 6–23)
CO2: 29 meq/L (ref 19–32)
Calcium: 9.2 mg/dL (ref 8.4–10.5)
Chloride: 103 meq/L (ref 96–112)
Creatinine, Ser: 0.74 mg/dL (ref 0.40–1.20)
GFR: 98.38 mL/min (ref >60.00)
Glucose, Bld: 76 mg/dL (ref 70–99)
Phosphorus: 4.1 mg/dL (ref 2.3–4.6)
Potassium: 3.6 meq/L (ref 3.5–5.1)
Sodium: 140 meq/L (ref 135–145)

## 2023-07-11 ENCOUNTER — Encounter: Payer: Self-pay | Admitting: Neurology

## 2023-07-11 ENCOUNTER — Ambulatory Visit: Admitting: Neurology

## 2023-07-11 VITALS — BP 135/85 | HR 77 | Ht 64.0 in | Wt 305.0 lb

## 2023-07-11 DIAGNOSIS — Z8669 Personal history of other diseases of the nervous system and sense organs: Secondary | ICD-10-CM

## 2023-07-11 DIAGNOSIS — R351 Nocturia: Secondary | ICD-10-CM | POA: Diagnosis not present

## 2023-07-11 DIAGNOSIS — R0683 Snoring: Secondary | ICD-10-CM | POA: Diagnosis not present

## 2023-07-11 DIAGNOSIS — Z6841 Body Mass Index (BMI) 40.0 and over, adult: Secondary | ICD-10-CM

## 2023-07-11 DIAGNOSIS — R519 Headache, unspecified: Secondary | ICD-10-CM

## 2023-07-11 NOTE — Progress Notes (Addendum)
 Subjective:    Patient ID: Maria Quinn is a 43 y.o. female.  HPI    True Mar, MD, PhD [facility] [address], [address] [address] [place], [place] [address]  Dear Roselie,  I saw your patient, Maria Quinn, upon your kind request in my sleep clinic today for initial consultation of her sleep disorder, in particular, evaluation of her prior diagnosis of obstructive sleep apnea.  The patient is unaccompanied today.  As you know, [name] is a 43 year old female with an underlying medical history of hypertension, left ventricular hypertrophy, anemia, arthritis, back pain, anxiety, depression, pancreatic insufficiency, history of gastric bypass surgery, hyperlipidemia, prediabetes, rheumatoid arthritis, vitamin D  deficiency, lower extremity swelling and morbid obesity with a BMI of over 50, who was previously diagnosed with obstructive sleep apnea and placed on PAP therapy.  Her Epworth sleepiness score is 3 out of 24, fatigue severity score is 56 out of 63.  She does not wake up rested.  Original testing was in 2010 before her gastric bypass surgery in December 2010.  She is considering a revision of her bariatric surgery with [facility].  She has snoring and witnessed apneas.  She lives with her mom.  She has no children, no pets at the house.  She has a variable sleep schedule because of her work schedule.  She works as a Radio producer.  She is a non-smoker and drinks alcohol in the form of wine on the weekend, caffeine in the form of coffee, usually 1 cup/day.  She has significant nocturia about 4 times per average night and used to have more recurrent morning headaches, now rarely.   She is not aware of any family history of sleep apnea.   I reviewed your office note from 04/12/2023.  Prior sleep study results are not available for my review today. She no longer has the machine.  She does report having some difficulty tolerating treatment at the time  but would be willing to get reevaluated and consider PAP therapy again if the need arises.  Her Past Medical History Is Significant For: Past Medical History:  Diagnosis Date   Anemia    Anxiety    Arthritis    Back pain    Chicken pox    Depression    Exocrine pancreatic insufficiency    Gastric bypass for obesity complicating pregnancy, birth, puerperium    Hyperlipidemia    Hypertension    Hypertension associated with diabetes (HCC)    OSA (obstructive sleep apnea)    Other fatigue    Pain in both feet 11/15/2020   Pre-diabetes    Rheumatoid arthritis (HCC)    Right-sided face pain 08/27/2018   Sleep apnea    SOB (shortness of breath) on exertion    Swelling of both lower extremities    Vitamin D  deficiency     Her Past Surgical History Is Significant For: Past Surgical History:  Procedure Laterality Date   ABDOMINAL HYSTERECTOMY     Dr. Buckner 2012    COLONOSCOPY WITH PROPOFOL  N/A 01/01/2019   Procedure: COLONOSCOPY WITH PROPOFOL ;  Surgeon: Janalyn Keene NOVAK, MD;  Location: [facility] ENDOSCOPY;  Service: Endoscopy;  Laterality: N/A;   ESOPHAGOGASTRODUODENOSCOPY (EGD) WITH PROPOFOL  N/A 01/01/2019   Procedure: ESOPHAGOGASTRODUODENOSCOPY (EGD) WITH PROPOFOL ;  Surgeon: Janalyn Keene NOVAK, MD;  Location: [facility] ENDOSCOPY;  Service: Endoscopy;  Laterality: N/A;   OOPHORECTOMY     43 y.o 1 ovary out per pt, later in life 2nd ovary out  ROUX-EN-Y GASTRIC BYPASS     2010 losst down to 180s gained back    TONSILLECTOMY     1987    Her Family History Is Significant For: Family History  Problem Relation Age of Onset   Depression Mother    Diabetes Mother    Hypertension Mother    Miscarriages / [place] Mother    Cancer Father        [email or web address]   Diabetes Father    Heart disease Father    Hyperlipidemia Father    Hypertension Father    Kidney disease Father    Stroke Father    Depression Sister    Alcohol abuse Brother    Cancer Brother        lung     Drug abuse Brother    Alcohol abuse Brother    Early death Brother    Arthritis Maternal Grandmother    Diabetes Maternal Grandmother    Hypertension Maternal Grandmother    Stroke Maternal Grandmother    Alcohol abuse Maternal Grandfather    Stroke Paternal Grandmother    Cancer Paternal Grandfather        ? type   Sleep apnea Neg Hx     Her Social History Is Significant For: Social History   Socioeconomic History   Marital status: Single    Spouse name: Not on file   Number of children: Not on file   Years of education: Not on file   Highest education level: 12th grade  Occupational History   Occupation: Teacher, adult education  Tobacco Use   Smoking status: Never   Smokeless tobacco: Never  Vaping Use   Vaping status: Never Used  Substance and Sexual Activity   Alcohol use: Yes    Alcohol/week: 4.0 standard drinks of alcohol    Types: 4 Glasses of wine per week   Drug use: No   Sexual activity: Yes    Comment: men  Other Topics Concern   Not on file  Social History Narrative   Some college   Entertainer    Single    No kids    Wears seat belt, safe in relationship    Will start Job 09/16/2018 [employer] in [place] 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    Social Drivers of Corporate investment banker Strain: Low Risk  (04/11/2023)   Overall Financial Resource Strain (CARDIA)    Difficulty of Paying Living Expenses: Not hard at all  Food Insecurity: No Food Insecurity (06/06/2023)   Hunger Vital Sign    Worried About Running Out of Food in the Last Year: Never true    Ran Out of Food in the Last Year: Never true  Transportation Needs: No Transportation Needs (06/06/2023)   PRAPARE - Administrator, Civil Service (Medical): No    Lack of Transportation (Non-Medical): No  Physical Activity: Insufficiently Active (04/11/2023)   Exercise Vital Sign    Days of Exercise per Week: 3 days    Minutes of Exercise per Session: 30 min   Stress: Stress Concern Present (04/11/2023)   [employer] of Occupational Health - Occupational Stress Questionnaire    Feeling of Stress : To some extent  Social Connections: Moderately Integrated (04/11/2023)   Social Connection and Isolation Panel    Frequency of Communication with Friends and Family: Twice a week    Frequency of Social Gatherings with Friends and Family: More than three times a week  Attends Religious Services: More than 4 times per year    Active Member of Clubs or Organizations: Yes    Attends Banker Meetings: More than 4 times per year    Marital Status: Never married    Her Allergies Are:  Allergies  Allergen Reactions   Penicillins Rash  :   Her Current Medications Are:  Outpatient Encounter Medications as of 07/11/2023  Medication Sig   amLODipine  (NORVASC ) 10 MG tablet Take 1 tablet (10 mg total) by mouth every evening.   blood glucose meter kit and supplies Dispense based on patient and insurance preference. Use up to two times daily as directed. (FOR ICD-10 E10.9, E11.9). 1 year supplies   Cholecalciferol (VITAMIN D ) 50 MCG (2000 UT) CAPS Take by mouth daily.   cyclobenzaprine  (FLEXERIL ) 5 MG tablet Take 1 tablet (5 mg total) by mouth 3 (three) times daily as needed for muscle spasms.   meloxicam  (MOBIC ) 7.5 MG tablet Take 7.5 mg by mouth daily.   metFORMIN  (GLUCOPHAGE ) 500 MG tablet Take 1 tablet (500 mg total) by mouth daily with breakfast.   Semaglutide , 1 MG/DOSE, (OZEMPIC , 1 MG/DOSE,) 4 MG/3ML SOPN Inject 1 mg into the skin once a week.   spironolactone  (ALDACTONE ) 25 MG tablet Take 1 tablet (25 mg total) by mouth daily.   triamcinolone  cream (KENALOG ) 0.1 % Apply 1 Application topically 2 (two) times daily.   valsartan  (DIOVAN ) 320 MG tablet Take 1 tablet (320 mg total) by mouth daily.   No facility-administered encounter medications on file as of 07/11/2023.  :   Review of Systems:  Out of a complete 14 point review of  systems, all are reviewed and negative with the exception of these symptoms as listed below:  Review of Systems  Neurological:        Pt here for sleep consult  Pt snores,fatigue,hypertension Pt denies headaches Pt states sleep study 2010 did have cpapa machine doesn't know where pap machine is located     ESS:3     Objective:  Neurological Exam  Physical Exam Physical Examination:   Vitals:   07/11/23 0947  BP: 135/85  Pulse: 77    General Examination: The patient is a very pleasant 43 y.o. female in no acute distress. She appears well-developed and well-nourished and well groomed.   HEENT: Normocephalic, atraumatic, pupils are equal, round and reactive to light, extraocular tracking is good without limitation to gaze excursion or nystagmus noted. Hearing is grossly intact. Face is symmetric with normal facial animation. Speech is clear with no dysarthria noted. There is no hypophonia. There is no lip, neck/head, jaw or voice tremor. Neck is supple with full range of passive and active motion. There are no carotid bruits on auscultation. Oropharynx exam reveals: mild mouth dryness, good dental hygiene and moderate airway crowding, due to small airway entry, slightly wider tongue, tonsils absent, Mallampati class II.  Neck circumference 15-1/4 inches, minimal overbite noted.  Tongue protrudes centrally and palate elevates symmetrically.  Chest: Clear to auscultation without wheezing, rhonchi or crackles noted.  Heart: S1+S2+0, regular and normal without murmurs, rubs or gallops noted.   Abdomen: Soft, non-tender and non-distended.  Extremities: There is no pitting edema in the distal lower extremities bilaterally.   Skin: Warm and dry without trophic changes noted.   Musculoskeletal: exam reveals no obvious joint deformities.   Neurologically:  Mental status: The patient is awake, alert and oriented in all 4 spheres. Her immediate and remote memory, attention, language skills  and fund of knowledge are appropriate. There is no evidence of aphasia, agnosia, apraxia or anomia. Speech is clear with normal prosody and enunciation. Thought process is linear. Mood is normal and affect is normal.  Cranial nerves II - XII are as described above under HEENT exam.  Motor exam: Normal bulk, strength and tone is noted. There is no obvious action or resting tremor.  Fine motor skills and coordination: grossly intact.  Cerebellar testing: No dysmetria or intention tremor. There is no truncal or gait ataxia.  Sensory exam: intact to light touch in the upper and lower extremities.  Gait, station and balance: She stands easily. No veering to one side is noted. No leaning to one side is noted. Posture is age-appropriate and stance is narrow based. Gait shows normal stride length and normal pace. No problems turning are noted.   Assessment and Plan:   In summary, Keisha T Croghan is a very pleasant 43 y.o.-year old female with an underlying medical history of hypertension, left ventricular hypertrophy, anemia, arthritis, back pain, anxiety, depression, pancreatic insufficiency, history of gastric bypass surgery, hyperlipidemia, prediabetes, rheumatoid arthritis, vitamin D  deficiency, lower extremity swelling and morbid obesity with a BMI of over 50, whose history and physical exam are concerning for sleep disordered breathing, particularly obstructive sleep apnea (OSA). A laboratory attended sleep study is typically considered gold standard for evaluation of sleep disordered breathing.   I had a long chat with the patient about my findings and the diagnosis of sleep apnea, particularly OSA, its prognosis and treatment options. We talked about medical/conservative treatments, surgical interventions and non-pharmacological approaches for symptom control. I explained, in particular, the risks and ramifications of untreated moderate to severe OSA, especially with respect to developing cardiovascular  disease down the road, including congestive heart failure (CHF), difficult to treat hypertension, cardiac arrhythmias (particularly A-fib), neurovascular complications including TIA, stroke and dementia. Even type 2 diabetes has, in part, been linked to untreated OSA. Symptoms of untreated OSA may include (but may not be limited to) daytime sleepiness, nocturia (i.e. frequent nighttime urination), memory problems, mood irritability and suboptimally controlled or worsening mood disorder such as depression and/or anxiety, lack of energy, lack of motivation, physical discomfort, as well as recurrent headaches, especially morning or nocturnal headaches. We talked about the importance of maintaining a healthy lifestyle and striving for healthy weight. In addition, we talked about the importance of striving for and maintaining good sleep hygiene.  Of note, she prefers sleeping in a quiet and dark as well as cool sleep environment.  She does not have a TV in her bedroom. I recommended a sleep study at this time. I outlined the differences between a laboratory attended sleep study which is considered more comprehensive and accurate over the option of a home sleep test (HST); the latter may lead to underestimation of sleep disordered breathing in some instances and does not help with diagnosing upper airway resistance syndrome and is not accurate enough to diagnose primary central sleep apnea typically. I outlined possible surgical and non-surgical treatment options of OSA, including the use of a positive airway pressure (PAP) device (i.e. CPAP, AutoPAP/APAP or BiPAP in certain circumstances), a custom-made dental device (aka oral appliance, which would require a referral to a specialist dentist or orthodontist typically, and is generally speaking not considered for patients with full dentures or edentulous state), upper airway surgical options, such as traditional UPPP (which is not considered a first-line treatment) or  the Inspire device (hypoglossal nerve stimulator, which would  involve a referral for consultation with an ENT surgeon, after careful selection, following inclusion criteria - also not first-line treatment). I explained the PAP treatment option to the patient in detail, as this is generally considered first-line treatment.  The patient indicated that she would be willing to try PAP therapy, if the need arises. I explained the importance of being compliant with PAP treatment, not only for insurance purposes but primarily to improve patient's symptoms symptoms, and for the patient's long term health benefit, including to reduce Her cardiovascular risks longer-term.    We will pick up our discussion about the next steps and treatment options after testing.  We will keep her posted as to the test results by phone call and/or MyChart messaging where possible.  We will plan to follow-up in sleep clinic accordingly as well.  I answered all her questions today and the patient was in agreement.   I encouraged her to call with any interim questions, concerns, problems or updates or email us  through MyChart.  Generally speaking, sleep test authorizations may take up to 2 weeks, sometimes less, sometimes longer, the patient is encouraged to get in touch with us  if they do not hear back from the sleep lab staff directly within the next 2 weeks.  Thank you very much for allowing me to participate in the care of this nice patient. If I can be of any further assistance to you please do not hesitate to call me at [phone].  Sincerely,   True Mar, MD, PhD   Addendum, 07/26/2023: I have changed the sleep study order to a split-night sleep study as her insurance plan does not cover a baseline sleep study but covers a split-night study.

## 2023-07-11 NOTE — Patient Instructions (Signed)

## 2023-07-12 ENCOUNTER — Telehealth: Payer: Self-pay | Admitting: Neurology

## 2023-07-12 ENCOUNTER — Ambulatory Visit: Admitting: Nurse Practitioner

## 2023-07-12 NOTE — Telephone Encounter (Signed)
 NPSG [place] pending

## 2023-07-23 ENCOUNTER — Ambulatory Visit: Admitting: Nurse Practitioner

## 2023-07-24 NOTE — Telephone Encounter (Signed)
 [facility] denied the NPSG. See below for the denial it looks like they are requesting for a split study to be ordered ?

## 2023-07-26 ENCOUNTER — Encounter: Payer: Self-pay | Admitting: Nurse Practitioner

## 2023-07-26 ENCOUNTER — Ambulatory Visit: Admitting: Nurse Practitioner

## 2023-07-26 VITALS — BP 120/80 | HR 74 | Ht 64.0 in | Wt 304.4 lb

## 2023-07-26 DIAGNOSIS — E1169 Type 2 diabetes mellitus with other specified complication: Secondary | ICD-10-CM | POA: Diagnosis not present

## 2023-07-26 DIAGNOSIS — I10 Essential (primary) hypertension: Secondary | ICD-10-CM

## 2023-07-26 DIAGNOSIS — Z7985 Long-term (current) use of injectable non-insulin antidiabetic drugs: Secondary | ICD-10-CM

## 2023-07-26 LAB — MICROALBUMIN / CREATININE URINE RATIO
Creatinine,U: 99.9 mg/dL
Microalb Creat Ratio: 14 mg/g (ref 0.0–30.0)
Microalb, Ur: 1.4 mg/dL (ref 0.0–1.9)

## 2023-07-26 MED ORDER — SPIRONOLACTONE 25 MG PO TABS: 25.0000 mg | ORAL_TABLET | Freq: Every day | ORAL | 1 refills | Status: AC

## 2023-07-26 NOTE — Assessment & Plan Note (Signed)
 Improved BP and at goal with valsartan , amlodipine  and spironolactone . BMP completed 06/29/2023: normal BP Readings from Last 3 Encounters:  07/26/23 120/80  07/11/23 135/85  06/15/23 (!) 160/100    Maintain med doses Refill sent F/up in 3-95months

## 2023-07-26 NOTE — Telephone Encounter (Signed)
 Spoke to patient made them aware that insurance denied NPSG and Dr Buck has placed order for split night study Pt expressed understanding and thanked me for calling

## 2023-07-26 NOTE — Addendum Note (Signed)
 Addended by: Esli Clements on: 07/26/2023 12:40 PM   Modules accepted: Orders

## 2023-07-26 NOTE — Telephone Encounter (Signed)
Split night study ordered 

## 2023-07-26 NOTE — Assessment & Plan Note (Signed)
 Ozempic  switched to mounjaro by bariatric clinic provider Metformin  dose maintained Last hgbA1c at 5.8%  Repeat UACr today

## 2023-07-26 NOTE — Progress Notes (Signed)
 Established Patient Visit  Patient: Maria Quinn   DOB: [birth year]   43 y.o. Female  MRN: [id number] Visit Date: 07/26/2023  Subjective:    Chief Complaint  Patient presents with   Follow-up    4 week follow up for HTN    HPI Essential hypertension Improved BP and at goal with valsartan , amlodipine  and spironolactone . BMP completed 06/29/2023: normal BP Readings from Last 3 Encounters:  07/26/23 120/80  07/11/23 135/85  06/15/23 (!) 160/100    Maintain med doses Refill sent F/up in 3-61months  DM (diabetes mellitus) (HCC) Ozempic  switched to mounjaro by bariatric clinic provider Metformin  dose maintained Last hgbA1c at 5.8%  Repeat UACr today   Wt Readings from Last 3 Encounters:  07/26/23 (!) 304 lb 6.4 oz (138.1 kg)  07/11/23 (!) 305 lb (138.3 kg)  06/15/23 (!) 306 lb 6.4 oz (139 kg)    Reviewed medical, surgical, and social history today  Medications: Outpatient Medications Prior to Visit  Medication Sig   amLODipine  (NORVASC ) 10 MG tablet Take 1 tablet (10 mg total) by mouth every evening.   blood glucose meter kit and supplies Dispense based on patient and insurance preference. Use up to two times daily as directed. (FOR ICD-10 E10.9, E11.9). 1 year supplies   Cholecalciferol (VITAMIN D ) 50 MCG (2000 UT) CAPS Take by mouth daily.   meloxicam  (MOBIC ) 7.5 MG tablet Take 7.5 mg by mouth daily.   metFORMIN  (GLUCOPHAGE ) 500 MG tablet Take 1 tablet (500 mg total) by mouth daily with breakfast.   MOUNJARO 5 MG/0.5ML Pen Inject 2.5 mg into the skin once a week.   triamcinolone  cream (KENALOG ) 0.1 % Apply 1 Application topically 2 (two) times daily.   valsartan  (DIOVAN ) 320 MG tablet Take 1 tablet (320 mg total) by mouth daily.   Vitamin D , Ergocalciferol , (DRISDOL ) 1.25 MG (50000 UNIT) CAPS capsule Take 50,000 Units by mouth once a week.   [DISCONTINUED] spironolactone  (ALDACTONE ) 25 MG tablet Take 1 tablet (25 mg total) by mouth daily.   [DISCONTINUED]  cyclobenzaprine  (FLEXERIL ) 5 MG tablet Take 1 tablet (5 mg total) by mouth 3 (three) times daily as needed for muscle spasms.   [DISCONTINUED] Semaglutide , 1 MG/DOSE, (OZEMPIC , 1 MG/DOSE,) 4 MG/3ML SOPN Inject 1 mg into the skin once a week.   No facility-administered medications prior to visit.   Reviewed past medical and social history.   ROS per HPI above      Objective:  BP 120/80 (BP Location: Left Arm, Patient Position: Sitting, Cuff Size: Normal)   Pulse 74   Ht 5' 4 (1.626 m)   Wt (!) 304 lb 6.4 oz (138.1 kg)   LMP 10/19/2010 Comment: No periods   BMI 52.25 kg/m      Physical Exam  No results found for any visits on 07/26/23.    Assessment & Plan:    Problem List Items Addressed This Visit     DM (diabetes mellitus) (HCC) - Primary   Ozempic  switched to mounjaro by bariatric clinic provider Metformin  dose maintained Last hgbA1c at 5.8%  Repeat UACr today      Relevant Medications   MOUNJARO 5 MG/0.5ML Pen   Other Relevant Orders   Urine microalbumin-creatinine with uACR   Essential hypertension   Improved BP and at goal with valsartan , amlodipine  and spironolactone . BMP completed 06/29/2023: normal BP Readings from Last 3 Encounters:  07/26/23 120/80  07/11/23 135/85  06/15/23 ROLLEN)  160/100    Maintain med doses Refill sent F/up in 3-73months      Relevant Medications   spironolactone  (ALDACTONE ) 25 MG tablet   Return in about 6 months (around 01/26/2024) for HTN, DM, hyperlipidemia (fasting).     Roselie Mood, NP

## 2023-07-26 NOTE — Patient Instructions (Signed)
Go to lab Maintain current med dose

## 2023-07-27 ENCOUNTER — Ambulatory Visit: Payer: Self-pay | Admitting: Nurse Practitioner

## 2023-07-31 NOTE — Telephone Encounter (Signed)
 Noted, thank you.  Split [place]: [id number] (exp. 07/31/23 to 10/29/23)

## 2023-07-31 NOTE — Telephone Encounter (Signed)
 Spoke with the patient.  Split [place] health auth: [id number] (exp. 07/31/23 to 10/29/23) *insurance only approved the split denied the NPSG   Patient is scheduled at [facility] for 08/26/2023 at 9 pm.  Mailed packet and sent mychart.

## 2023-08-26 ENCOUNTER — Encounter

## 2023-09-25 ENCOUNTER — Other Ambulatory Visit: Payer: Self-pay | Admitting: Nurse Practitioner

## 2023-09-25 ENCOUNTER — Ambulatory Visit (INDEPENDENT_AMBULATORY_CARE_PROVIDER_SITE_OTHER): Admitting: Neurology

## 2023-09-25 DIAGNOSIS — R351 Nocturia: Secondary | ICD-10-CM

## 2023-09-25 DIAGNOSIS — R0683 Snoring: Secondary | ICD-10-CM

## 2023-09-25 DIAGNOSIS — E1165 Type 2 diabetes mellitus with hyperglycemia: Secondary | ICD-10-CM

## 2023-09-25 DIAGNOSIS — G4733 Obstructive sleep apnea (adult) (pediatric): Secondary | ICD-10-CM

## 2023-09-25 DIAGNOSIS — R519 Headache, unspecified: Secondary | ICD-10-CM

## 2023-09-25 DIAGNOSIS — G4720 Circadian rhythm sleep disorder, unspecified type: Secondary | ICD-10-CM

## 2023-09-25 DIAGNOSIS — Z8669 Personal history of other diseases of the nervous system and sense organs: Secondary | ICD-10-CM

## 2023-10-04 NOTE — Procedures (Unsigned)
 Physician Interpretation: Please see link under Procedure Tab or under Encounters tab for physician report, technical report, as well as O2 titration and/or PAP titration tables (if applicable).   Referred by: Dr. Modena Callander   History and Indication for Testing (obtained from visit note dated 07/12/2023): 43 year old female with an underlying complex medical history of stroke in May 2025, hypertension, hyperlipidemia, coronary artery disease, prior smoking, COPD, history of kidney stone, carotid artery disease with status post bilateral carotid endarterectomies, hypothyroidism, and overweight state, who reports snoring and nocturia. His Epworth sleepiness score is 3 out of 24, fatigue severity score is 15 out of 63.     Review of the EEG showed no abnormal electrical discharges and symmetrical bihemispheric findings.     EKG: The EKG revealed normal sinus rhythm (NSR). ***   AUDIO/VIDEO REVIEW: The audio and video review did not show any abnormal or unusual behaviors, movements, phonations or vocalizations. The patient took *** restroom breaks. Snoring was noted, ***   POST-STUDY QUESTIONNAIRE: Post study, the patient indicated, that sleep was *** the same as usual.    IMPRESSION:    Obstructive Sleep Apnea (OSA), *** ***Central Sleep Apnea (CSA) ***Primary Snoring ***Primary Central Sleep Apnea ***Complex Sleep Apnea ***PLMD (periodic limb movement disorder [of sleep]) ***Dysfunctions associated with sleep stages or arousal from sleep ***Non-specific abnormal electrocardiogram (EKG) ***Poor sleep pattern ***Inconclusive Test   RECOMMENDATIONS:         I certify that I have reviewed the entire raw data recording prior to the issuance of this report in accordance with the Standards of Accreditation of the American Academy of Sleep Medicine (AASM).   True Mar, MD, PhD Medical Director, [facility]) Diplomat, ABPN (Neurology and  Sleep)

## 2023-10-05 ENCOUNTER — Ambulatory Visit: Payer: Self-pay | Admitting: Neurology

## 2023-10-05 DIAGNOSIS — G4733 Obstructive sleep apnea (adult) (pediatric): Secondary | ICD-10-CM

## 2023-10-08 NOTE — Telephone Encounter (Signed)
-----   Message from True Mar sent at 10/05/2023 11:55 AM EDT ----- Patient referred by PCP, seen by me on 07/11/23, patient had a split night sleep study on 09/25/23. Please call and notify patient that the recent sleep study showed moderate obstructive sleep apnea (OSA). She did well with CPAP during the study with significant improvement of the respiratory  events. I would like start the patient on a CPAP machine for home use. I placed the order in the chart.  Please advise patient that we will need a follow up appointment with either myself or one of our nurse practitioners in about 2-3 months post set-up to check for how they are doing on treatment and  how well it's going with the machine in general. Most insurance company require a certain compliance percentage to continue to cover/pay for the machine. Please ask patient to schedule this FU  appointment, according to the set-up date, which is the day they receive the machine. Please make sure, the patient understands the importance of keeping this window for the FU appointment, as it is  often an Barista and not our rule. Failing to adhere to this may result in losing coverage for sleep apnea treatment, at which point some insurances require repeating the whole process.  Plus, monitoring compliance data is usually good feedback for the patient as far as how they are doing, how many hours they are on it, how well the mask fits, etc.  Also remind patient, that any PAP machine or mask issues should be first addressed with the DME company, who provided the machine/supplies.  Please ask if patient has a preference regarding DME company, may depend on the insurance too.  True Mar, MD, PhD [facility])   ----- Message ----- From: Rebecka Fleeta Higashi In One Three One Sent: 10/05/2023  11:53 AM EDT To: True Mar, MD

## 2023-10-08 NOTE — Telephone Encounter (Signed)
 Rep from [facility] called returning call about Pt CPAP machine  Callback number  IS  [phone]

## 2023-10-08 NOTE — Telephone Encounter (Signed)
 I called pt and relayed results of sleep study. Moderated sleep apnea .  She did well with CPAP during the study.  Recommendation is start cpap for home use. Use nightly 4 hrs minimum but medically use all night.  DME to call her after speaking to insurance.  They will call her to pick up machine, supplies, instruction on usage, fit with mask. We will see her back in 2-3 months once started using machine.  Pt verbalized understanding.  [facility] and [facility] do not take [employer].  Sent message to pt for her to see what DME is is network with her insurance.

## 2023-10-10 NOTE — Telephone Encounter (Signed)
 RE: new cpap user Received: Today Rumalda Maria Quinn Neysa [name] GORMAN, RN; [place], [place]; Nickola Stabs; [facility], Jada Pulled order for cpap. We will process auth request to patient's insurance and contact the patient.  thank you!  Maria Quinn     Previous Messages    ----- Message ----- From: Neysa [name] GORMAN, RN Sent: 10/10/2023  12:57 PM EDT To: Maria Quinn Rumalda; [facility]; Stabs Bunting* Subject: new cpap user                                  Good afternoon, new order in epic.   This was from pts insurance:   :Hi Lujean, thank you for calling your [facility] today!   Here is the contact information for our in-network DME providers for CPAP devices. Before they will be able to provide confirmation of coverage there will need to be an approved prior authorization on file for the device. This can be submitted by your Provider for medical necessity review and determination.   [facility] [phone] Kimber [phone]     Bettyjean, Stefanski Preferred Name: Ketura Female, 43 y.o., 1980/07/09 Last BMI: 52.3 MRN: [id number]  [facility]

## 2023-10-23 ENCOUNTER — Encounter: Payer: Self-pay | Admitting: Nurse Practitioner

## 2023-10-23 DIAGNOSIS — L659 Nonscarring hair loss, unspecified: Secondary | ICD-10-CM

## 2023-11-07 ENCOUNTER — Ambulatory Visit: Payer: Self-pay

## 2023-11-07 NOTE — Telephone Encounter (Signed)
 Patient called and informed that the referral has been switched to [facility] or [facility]. She thanked me for calling and all questions (if any) were answered.

## 2023-11-07 NOTE — Addendum Note (Signed)
 Addended by: KATHEEN GARDEN L on: 11/07/2023 03:15 PM   Modules accepted: Orders

## 2023-11-07 NOTE — Telephone Encounter (Signed)
 FYI Only or Action Required?: FYI only for provider: appointment scheduled on 11/08/2023 at 9am.  Patient was last seen in primary care on 07/26/2023 by Nche, Roselie Rockford, NP.  Called Nurse Triage reporting Cough.  Symptoms began 3 days ago.  Interventions attempted: OTC medications: Theraflu Cold Relief Daytime and Nighttime and Rest, hydration, or home remedies.  Symptoms are: gradually worsening.  Triage Disposition: See Physician Within 24 Hours  Patient/caregiver understands and will follow disposition?: Yes              Copied from CRM [id number]. Topic: Clinical - Red Word Triage >> Nov 07, 2023 10:04 AM Vena HERO wrote: Red Word that prompted transfer to Nurse Triage: cough, congestion and phlegm, no fever Reason for Disposition  [1] Continuous (nonstop) coughing interferes with work or school AND [2] no improvement using cough treatment per Care Advice  Answer Assessment - Initial Assessment Questions Just started using a CPAP on 10/23/2023 Denies fever  Patient is advised to call us  back if anything changes or with any further questions/concerns. Patient is advised that if anything worsens to go to the Emergency Room. Patient verbalized understanding.   1. ONSET: When did the cough begin?      3 days ago 2. SEVERITY: How bad is the cough today?      ---- 3. SPUTUM: Describe the color of your sputum (e.g., none, dry cough; clear, white, yellow, green)     Dark green 4. HEMOPTYSIS: Are you coughing up any blood? If Yes, ask: How much? (e.g., flecks, streaks, tablespoons, etc.)     Denies 5. DIFFICULTY BREATHING: Are you having difficulty breathing? If Yes, ask: How bad is it? (e.g., mild, moderate, severe)      Denies 6. FEVER: Do you have a fever? If Yes, ask: What is your temperature, how was it measured, and when did it start?     Denies 7. CARDIAC HISTORY: Do you have any history of heart disease? (e.g., heart attack, congestive  heart failure)      Denies 8. LUNG HISTORY: Do you have any history of lung disease?  (e.g., pulmonary embolus, asthma, emphysema)     Denies 9. PE RISK FACTORS: Do you have a history of blood clots? (or: recent major surgery, recent prolonged travel, bedridden)     Denies 10. OTHER SYMPTOMS: Do you have any other symptoms? (e.g., runny nose, wheezing, chest pain)       Ears itching, congestion 11. PREGNANCY: Is there any chance you are pregnant? When was your last menstrual period?       No  Protocols used: Cough - Acute Productive-A-AH

## 2023-11-07 NOTE — Telephone Encounter (Signed)
 Copied from CRM [id number]. Topic: Referral - Question >> Nov 07, 2023  9:59 AM Vena HERO wrote: Reason for CRM: Dermatologist called to make an appt but they don't have anything until June 2026 and pts condition is becoming detrimental. She would like to request you to call on her behalf to implore them to make an accomodation for a sooner appt.

## 2023-11-07 NOTE — Telephone Encounter (Signed)
 Patient is scheduled to see Nche tomorrow.

## 2023-11-08 ENCOUNTER — Ambulatory Visit: Admitting: Nurse Practitioner

## 2023-11-08 ENCOUNTER — Encounter: Payer: Self-pay | Admitting: Nurse Practitioner

## 2023-11-08 VITALS — BP 128/82 | HR 68 | Temp 97.8°F | Ht 64.0 in | Wt 289.0 lb

## 2023-11-08 DIAGNOSIS — J069 Acute upper respiratory infection, unspecified: Secondary | ICD-10-CM | POA: Diagnosis not present

## 2023-11-08 LAB — POCT INFLUENZA A/B
Influenza A, POC: NEGATIVE
Influenza B, POC: NEGATIVE

## 2023-11-08 LAB — POC COVID19 BINAXNOW: SARS Coronavirus 2 Ag: NEGATIVE

## 2023-11-08 NOTE — Patient Instructions (Addendum)
 Sign medical release to obtain last eye exam. URI Instructions: Encourage adequate oral hydration. Avoid decongestatnts Ok to use Coricidin HBP for chest and sinus congestion Use mucinex DM or Robitussin  or delsym for cough without sinus congestion  You can use plain Tylenol or Advil for fever, chills and achyness. Use cool mist humidifier at bedtime to help with nasal congestion and cough.  Cold/cough medications may have tylenol or ibuprofen or guaifenesin or dextromethophan in them, so be careful not to take beyond the recommended dose for each of these medications.   Common cold symptoms are usually triggered by a virus.  The antibiotics are usually not necessary. On average, a viral cold illness may take 7-10 days to resolve. Please, make an appointment if you are not better or if you're worse.

## 2023-11-08 NOTE — Progress Notes (Signed)
 Acute Office Visit  Subjective:    Patient ID: Maria Quinn, female    DOB: 1980-11-28, 43 y.o.   MRN: [id number]  Chief Complaint  Patient presents with   Cough    Started 3 days ago productive cough light green to clear mucus, bilateral itchy ears     URI  This is a new problem. The current episode started in the past 7 days. The problem has been gradually improving. There has been no fever. Associated symptoms include congestion, coughing, joint pain and a plugged ear sensation. Pertinent negatives include no abdominal pain, chest pain, diarrhea, dysuria, ear pain, headaches, joint swelling, nausea, neck pain, rash, rhinorrhea, sinus pain, sneezing, sore throat, swollen glands, vomiting or wheezing. She has tried acetaminophen, decongestant, increased fluids and antihistamine for the symptoms. The treatment provided moderate relief.    Outpatient Medications Prior to Visit  Medication Sig   amLODipine  (NORVASC ) 10 MG tablet Take 1 tablet (10 mg total) by mouth every evening.   blood glucose meter kit and supplies Dispense based on patient and insurance preference. Use up to two times daily as directed. (FOR ICD-10 E10.9, E11.9). 1 year supplies   Cholecalciferol (VITAMIN D ) 50 MCG (2000 UT) CAPS Take by mouth daily.   meloxicam  (MOBIC ) 7.5 MG tablet Take 7.5 mg by mouth daily.   metFORMIN  (GLUCOPHAGE ) 500 MG tablet Take 1 tablet by mouth once daily with breakfast   MOUNJARO 10 MG/0.5ML Pen Inject 10 mg into the skin once a week.   spironolactone  (ALDACTONE ) 25 MG tablet Take 1 tablet (25 mg total) by mouth daily.   triamcinolone  cream (KENALOG ) 0.1 % Apply 1 Application topically 2 (two) times daily.   valsartan  (DIOVAN ) 320 MG tablet Take 1 tablet (320 mg total) by mouth daily.   Vitamin D , Ergocalciferol , (DRISDOL ) 1.25 MG (50000 UNIT) CAPS capsule Take 50,000 Units by mouth once a week.   [DISCONTINUED] MOUNJARO 5 MG/0.5ML Pen Inject 2.5 mg into the skin once a week.   No  facility-administered medications prior to visit.    Reviewed past medical and social history.  Review of Systems  HENT:  Positive for congestion. Negative for ear pain, rhinorrhea, sinus pain, sneezing and sore throat.   Respiratory:  Positive for cough. Negative for wheezing.   Cardiovascular:  Negative for chest pain.  Gastrointestinal:  Negative for abdominal pain, diarrhea, nausea and vomiting.  Genitourinary:  Negative for dysuria.  Musculoskeletal:  Positive for joint pain. Negative for neck pain.  Skin:  Negative for rash.  Neurological:  Negative for headaches.   Per HPI     Objective:    Physical Exam Constitutional:      General: She is not in acute distress. HENT:     Right Ear: Tympanic membrane, ear canal and external ear normal.     Left Ear: Tympanic membrane, ear canal and external ear normal.     Nose: No nasal tenderness, mucosal edema or congestion.     Right Nostril: No occlusion.     Left Nostril: No occlusion.     Right Turbinates: Not enlarged, swollen or pale.     Left Turbinates: Not enlarged, swollen or pale.     Right Sinus: No maxillary sinus tenderness or frontal sinus tenderness.     Left Sinus: No maxillary sinus tenderness or frontal sinus tenderness.     Mouth/Throat:     Pharynx: Uvula midline.     Tonsils: No tonsillar exudate or tonsillar abscesses.  Cardiovascular:  Rate and Rhythm: Normal rate and regular rhythm.     Pulses: Normal pulses.     Heart sounds: Normal heart sounds.  Pulmonary:     Effort: Pulmonary effort is normal.     Breath sounds: Normal breath sounds.  Musculoskeletal:     Cervical back: Normal range of motion and neck supple.  Lymphadenopathy:     Cervical: No cervical adenopathy.  Neurological:     Mental Status: She is alert and oriented to person, place, and time.    BP 128/82 (BP Location: Left Arm, Patient Position: Sitting, Cuff Size: Large)   Pulse 68   Temp 97.8 F (36.6 C) (Temporal)   Ht 5' 4  (1.626 m)   Wt 289 lb (131.1 kg)   LMP 10/19/2010 Comment: No periods   BMI 49.61 kg/m    Results for orders placed or performed in visit on 11/08/23  POCT Influenza A/B  Result Value Ref Range   Influenza A, POC Negative Negative   Influenza B, POC Negative Negative  POC COVID-19  Result Value Ref Range   SARS Coronavirus 2 Ag Negative Negative       Assessment & Plan:   Problem List Items Addressed This Visit   None Visit Diagnoses       Viral upper respiratory tract infection    -  Primary   Relevant Orders   POCT Influenza A/B (Completed)   POC COVID-19 (Completed)      Encourage adequate oral hydration. Avoid decongestatnts Ok to use Coricidin HBP for chest and sinus congestion Use mucinex DM or Robitussin  or delsym for cough without sinus congestion  You can use plain Tylenol or Advil for fever, chills and achyness. Use cool mist humidifier at bedtime to help with nasal congestion and cough.  No orders of the defined types were placed in this encounter.  Return in about 3 months (around 01/25/2024) for CPE (fasting) with PAP.    Roselie Mood, NP

## 2023-11-12 ENCOUNTER — Encounter: Payer: Self-pay | Admitting: Radiology

## 2023-11-14 ENCOUNTER — Encounter: Payer: Self-pay | Admitting: Nurse Practitioner

## 2023-11-14 ENCOUNTER — Other Ambulatory Visit: Payer: Self-pay | Admitting: Nurse Practitioner

## 2023-11-14 DIAGNOSIS — R051 Acute cough: Secondary | ICD-10-CM

## 2023-11-16 MED ORDER — PREDNISONE 20 MG PO TABS: 30.0000 mg | ORAL_TABLET | Freq: Every day | ORAL | 0 refills | Status: AC

## 2023-11-16 MED ORDER — BENZONATATE 200 MG PO CAPS: 200.0000 mg | ORAL_CAPSULE | Freq: Three times a day (TID) | ORAL | 0 refills | Status: AC | PRN

## 2023-11-26 ENCOUNTER — Telehealth: Payer: Self-pay | Admitting: Neurology

## 2023-11-26 DIAGNOSIS — G4733 Obstructive sleep apnea (adult) (pediatric): Secondary | ICD-10-CM

## 2023-11-26 DIAGNOSIS — Z789 Other specified health status: Secondary | ICD-10-CM

## 2023-11-26 NOTE — Telephone Encounter (Signed)
 Pt asked it be noted that she is returning the CPAP because she does not like it.

## 2023-11-26 NOTE — Telephone Encounter (Signed)
 Called pt at [phone]. 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 CPAP is drying her out and unable to sing. Used for 21 days.  She is going to return as her income depends on her singing. Has bad debt and unable to make appt to discuss other options per pt.   Aware I will send to Dr. Buck to see if she would qualify for dental device instead (relayed dentist would make this and may or may not be covered by insurance). Relayed Dr. Buck out of office but once back and can review, we will call her.

## 2023-11-27 ENCOUNTER — Telehealth: Payer: Self-pay | Admitting: Neurology

## 2023-11-27 NOTE — Telephone Encounter (Signed)
 For moderate obstructive sleep apnea, CPAP or AutoPap therapy is typically recommended.  She may be able to work with her DME company regarding mask fit and humidity level to improve mouth dryness and throat dryness.  I recommend she make an appointment with her DME provider.  She may be a candidate for dental device and while not first-line, it may help.  I have placed a referral to dentistry, please send referral to Dr. Oneil Forget.

## 2023-11-27 NOTE — Telephone Encounter (Signed)
 Pt called  returning call   , Informed  Pt Nurse will call back

## 2023-11-27 NOTE — Addendum Note (Signed)
 Addended by: Caya Soberanis on: 11/27/2023 03:05 PM   Modules accepted: Orders

## 2023-11-27 NOTE — Telephone Encounter (Signed)
 I called pt Maria Quinn for her to return call.

## 2023-11-27 NOTE — Telephone Encounter (Signed)
 Referral for dentistry fax to [facility]. Phone: [phone], Fax: [phone]

## 2023-11-28 NOTE — Telephone Encounter (Signed)
 I called pt back and relayed to her Dr. Obie message. Pt was in car going to DME to return.  She said that she cannot use it, she had been in touch with DME and tried there suggestions.  I relayed that dental device is not first line for moderate OSA.  She is aware of risks of her health due to OSA.  She does want to proceed with dental device. I gave her Dr. Oneil Forget name but will mychart email to her.  She appreciated the call.

## 2024-01-08 ENCOUNTER — Ambulatory Visit: Payer: Self-pay | Admitting: Nurse Practitioner

## 2024-01-08 NOTE — Telephone Encounter (Signed)
 FYI Only or Action Required?: Action required by provider: Requesting medication.  Patient was last seen in primary care on 11/08/2023 by Nche, Roselie Rockford, NP.  Called Nurse Triage reporting Facial Pain.  Symptoms began a week ago.  Interventions attempted: Nothing.  Symptoms are: gradually improving.  Triage Disposition: Home Care  Patient/caregiver understands and will follow disposition?: Yes Reason for Disposition  [1] Sinus congestion as part of a cold AND [2] present < 10 days  Answer Assessment - Initial Assessment Questions Patient reports sinus pressure, when asked where it is states just the ears. Patient is calling in to request the same medication she had in February for similar symptoms to [facility] on file. Please advise. Patient requesting call back  [phone]  1. LOCATION: Where does it hurt?      Ear pressure   2. ONSET: When did the sinus pain start?  (e.g., hours, days)      A week ago  3. SEVERITY: How bad is the pain?   (Scale 0-10; or none, mild, moderate or severe)     Feels like head is stuffed with cotton  4. RECURRENT SYMPTOM: Have you ever had sinus problems before? If Yes, ask: When was the last time? and What happened that time?      Yes, Feb 2025 and was given medication  5. NASAL CONGESTION: Is the nose blocked? If Yes, ask: Can you open it or must you breathe through your mouth?     Mild, mostly clear  6. NASAL DISCHARGE: Do you have discharge from your nose? If so ask, What color?     Denies  7. FEVER: Do you have a fever? If Yes, ask: What is it, how was it measured, and when did it start?      Denies  8. OTHER SYMPTOMS: Do you have any other symptoms? (e.g., sore throat, cough, earache, difficulty breathing)     Cough last week, subsided  Protocols used: Sinus Pain or Congestion-A-AH  Message from Brittany M sent at 01/08/2024 11:00 AM EST  Reason for Triage: Patient is having cold symptoms- possible sinus  infection-started a couple days ago.

## 2024-01-09 NOTE — Progress Notes (Signed)
 Maria Quinn

## 2024-01-11 NOTE — Telephone Encounter (Signed)
 Called and left a detailed voice message per DPR on file informing her that she can scheduled a e-visit with a provider through MyChart.

## 2024-01-14 ENCOUNTER — Telehealth: Admitting: Adult Health

## 2024-01-14 DIAGNOSIS — G4733 Obstructive sleep apnea (adult) (pediatric): Secondary | ICD-10-CM

## 2024-01-14 NOTE — Progress Notes (Signed)
 "    PATIENT: Maria Quinn DOB: 12/29/1980  REASON FOR VISIT: follow up HISTORY FROM: patient PRIMARY NEUROLOGIST: Dr. Buck   Virtual Visit via Video Note  I connected with Maria Quinn on 01/14/2024 at  8:15 AM EST by a video enabled telemedicine application located remotely at [facility] and verified that I am speaking with the correct person using two identifiers who was located at their own home in [place].    I discussed the limitations of evaluation and management by telemedicine and the availability of in person appointments. The patient expressed understanding and agreed to proceed.   PATIENT: Maria Quinn DOB: [birth year]  REASON FOR VISIT: follow up HISTORY FROM: patient  HISTORY OF PRESENT ILLNESS: Today 01/14/2024  Maria Quinn is a 44 y.o. female with a history of obstructive sleep apnea.  Returns today for follow-up.  Patient states that she cannot tolerate the CPAP.  She states it dried her throat out.  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  She did try adjusting the humidification but it did not help.  She states that she is not willing to go back on the CPAP machine.  She did get fitted for a dental device however the out-of-pocket cost was expensive.  She does have new insurance so she plans to have them rerun it to see if it is more affordable now.       REVIEW OF SYSTEMS: Out of a complete 14 system review of symptoms, the patient complains only of the following symptoms, and all other reviewed systems are negative.  ALLERGIES: Allergies[1]  HOME MEDICATIONS: Outpatient Medications Prior to Visit  Medication Sig Dispense Refill   amLODipine  (NORVASC ) 10 MG tablet Take 1 tablet (10 mg total) by mouth every evening. 90 tablet 3   benzonatate  (TESSALON ) 200 MG capsule Take 1 capsule (200 mg total) by mouth 3 (three) times daily as needed. 21 capsule 0   blood glucose meter kit and supplies Dispense based on  patient and insurance preference. Use up to two times daily as directed. (FOR ICD-10 E10.9, E11.9). 1 year supplies 1 each 0   Cholecalciferol (VITAMIN D ) 50 MCG (2000 UT) CAPS Take by mouth daily.     meloxicam  (MOBIC ) 7.5 MG tablet Take 1 tablet by mouth once daily with food 30 tablet 2   metFORMIN  (GLUCOPHAGE ) 500 MG tablet Take 1 tablet by mouth once daily with breakfast 90 tablet 1   MOUNJARO 10 MG/0.5ML Pen Inject 10 mg into the skin once a week.     spironolactone  (ALDACTONE ) 25 MG tablet Take 1 tablet (25 mg total) by mouth daily. 90 tablet 1   triamcinolone  cream (KENALOG ) 0.1 % Apply 1 Application topically 2 (two) times daily. 30 g 2   valsartan  (DIOVAN ) 320 MG tablet Take 1 tablet (320 mg total) by mouth daily. 90 tablet 1   Vitamin D , Ergocalciferol , (DRISDOL ) 1.25 MG (50000 UNIT) CAPS capsule Take 50,000 Units by mouth once a week.     No facility-administered medications prior to visit.    PAST MEDICAL HISTORY: Past Medical History:  Diagnosis Date   Anemia    Anxiety    Arthritis    Back pain    Chicken pox    Depression    Exocrine pancreatic insufficiency    Gastric bypass for obesity complicating pregnancy, birth, puerperium    Hyperlipidemia    Hypertension    Hypertension associated with  diabetes (HCC)    OSA (obstructive sleep apnea)    Other fatigue    Pain in both feet 11/15/2020   Pre-diabetes    Rheumatoid arthritis (HCC)    Right-sided face pain 08/27/2018   Sleep apnea    SOB (shortness of breath) on exertion    Swelling of both lower extremities    Vitamin D  deficiency     PAST SURGICAL HISTORY: Past Surgical History:  Procedure Laterality Date   ABDOMINAL HYSTERECTOMY     Dr. Buckner 2012    COLONOSCOPY WITH PROPOFOL  N/A 01/01/2019   Procedure: COLONOSCOPY WITH PROPOFOL ;  Surgeon: Janalyn Keene NOVAK, MD;  Location: [facility] END[facility]COPY;  Service: Endoscopy;  Laterality: N/A;   ESOPHAGOGASTRODUODENOSCOPY (EGD) WITH PROPOFOL  N/A 01/01/2019    Procedure: ESOPHAGOGASTRODUODENOSCOPY (EGD) WITH PROPOFOL ;  Surgeon: Janalyn Keene NOVAK, MD;  Location: [facility] ENDOSCOPY;  Service: Endoscopy;  Laterality: N/A;   OOPHORECTOMY     44 y.o 1 ovary out per pt, later in life 2nd ovary out    ROUX-EN-Y GASTRIC BYPASS     2010 losst down to 180s gained back    TONSILLECTOMY     1987    FAMILY HISTORY: Family History  Problem Relation Age of Onset   Depression Mother    Diabetes Mother    Hypertension Mother    Miscarriages / Stillbirths Mother    Cancer Father        [email or web address]   Diabetes Father    Heart disease Father    Hyperlipidemia Father    Hypertension Father    Kidney disease Father    Stroke Father    Depression Sister    Alcohol abuse Brother    Cancer Brother        lung    Drug abuse Brother    Alcohol abuse Brother    Early death Brother    Arthritis Maternal Grandmother    Diabetes Maternal Grandmother    Hypertension Maternal Grandmother    Stroke Maternal Grandmother    Alcohol abuse Maternal Grandfather    Stroke Paternal Grandmother    Cancer Paternal Grandfather        ? type   Sleep apnea Neg Hx     SOCIAL HISTORY: Social History   Socioeconomic History   Marital status: Single    Spouse name: Not on file   Number of children: Not on file   Years of education: Not on file   Highest education level: 12th grade  Occupational History   Occupation: teacher, adult education  Tobacco Use   Smoking status: Never   Smokeless tobacco: Never  Vaping Use   Vaping status: Never Used  Substance and Sexual Activity   Alcohol use: Yes    Alcohol/week: 4.0 standard drinks of alcohol    Types: 4 Glasses of wine per week   Drug use: No   Sexual activity: Yes    Comment: men  Other Topics Concern   Not on file  Social History Narrative   Some college   Entertainer    Single    No kids    Wears seat belt, safe in relationship    Will start Job 09/16/2018 [employer] in [place] 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    Social Drivers of Health   Tobacco Use: Low Risk (12/03/2023)   Received from [facility]   Patient History    Smoking Tobacco Use: Never    Smokeless Tobacco Use: Never  Passive Exposure: Not on file  Financial Resource Strain: Low Risk (04/11/2023)   Overall Financial Resource Strain (CARDIA)    Difficulty of Paying Living Expenses: Not hard at all  Food Insecurity: No Food Insecurity (06/06/2023)   Hunger Vital Sign    Worried About Running Out of Food in the Last Year: Never true    Ran Out of Food in the Last Year: Never true  Transportation Needs: No Transportation Needs (06/06/2023)   PRAPARE - Administrator, Civil Service (Medical): No    Lack of Transportation (Non-Medical): No  Physical Activity: Insufficiently Active (04/11/2023)   Exercise Vital Sign    Days of Exercise per Week: 3 days    Minutes of Exercise per Session: 30 min  Stress: Stress Concern Present (04/11/2023)   [employer] of Occupational Health - Occupational Stress Questionnaire    Feeling of Stress : To some extent  Social Connections: Moderately Integrated (04/11/2023)   Social Connection and Isolation Panel    Frequency of Communication with Friends and Family: Twice a week    Frequency of Social Gatherings with Friends and Family: More than three times a week    Attends Religious Services: More than 4 times per year    Active Member of [employer] or Organizations: Yes    Attends Banker Meetings: More than 4 times per year    Marital Status: Never married  Intimate Partner Violence: Not At Risk (06/06/2023)   Humiliation, Afraid, Rape, and Kick questionnaire    Fear of Current or Ex-Partner: No    Emotionally Abused: No    Physically Abused: No    Sexually Abused: No  Depression (PHQ2-9): Low Risk (06/06/2023)   Depression (PHQ2-9)    PHQ-2 Score: 1  Alcohol Screen: Low Risk (04/11/2023)   Alcohol Screen    Last Alcohol  Screening Score (AUDIT): 2  Housing: Low Risk (06/06/2023)   Housing Stability Vital Sign    Unable to Pay for Housing in the Last Year: No    Number of Times Moved in the Last Year: 1    Homeless in the Last Year: No  Utilities: Not At Risk (06/06/2023)   AHC Utilities    Threatened with loss of utilities: No  Health Literacy: Not on file      PHYSICAL EXAM Generalized: Well developed, in no acute distress   Neurological examination  Mentation: Alert oriented to time, place, history taking. Follows all commands speech and language fluent Cranial nerve II-XII: Facial symmetry noted.   DIAGNOSTIC DATA (LABS, IMAGING, TESTING) - I reviewed patient records, labs, notes, testing and imaging myself where available.  Lab Results  Component Value Date   WBC 4.5 06/06/2023   HGB 11.6 (L) 06/06/2023   HCT 37.8 06/06/2023   MCV 73.7 (L) 06/06/2023   PLT 232 06/06/2023      Component Value Date/Time   NA 140 06/29/2023 0858   NA 146 (H) 03/09/2021 0802   K 3.6 06/29/2023 0858   CL 103 06/29/2023 0858   CO2 29 06/29/2023 0858   GLUCOSE 76 06/29/2023 0858   BUN 19 06/29/2023 0858   BUN 16 03/09/2021 0802   CREATININE 0.74 06/29/2023 0858   CALCIUM 9.2 06/29/2023 0858   PROT 7.6 02/07/2023 1150   PROT 6.5 03/09/2021 0802   ALBUMIN 4.4 06/29/2023 0858   ALBUMIN 4.1 03/09/2021 0802   AST 12 02/07/2023 1150   ALT 11 02/07/2023 1150   ALKPHOS 63 02/07/2023 1150   BILITOT  0.5 02/07/2023 1150   BILITOT 0.4 03/09/2021 0802   GFRNONAA >60 08/19/2017 2339   GFRAA >60 08/19/2017 2339   Lab Results  Component Value Date   CHOL 167 02/07/2023   HDL 67.80 02/07/2023   LDLCALC 85 02/07/2023   LDLDIRECT 79.0 05/16/2023   TRIG 68.0 02/07/2023   CHOLHDL 2 02/07/2023   Lab Results  Component Value Date   HGBA1C 5.8 05/16/2023   Lab Results  Component Value Date   VITAMINB12 197 06/06/2023   Lab Results  Component Value Date   TSH 0.85 02/07/2023      ASSESSMENT AND  PLAN 44 y.o. year old female  has a past medical history of Anemia, Anxiety, Arthritis, Back pain, Chicken pox, Depression, Exocrine pancreatic insufficiency, Gastric bypass for obesity complicating pregnancy, birth, puerperium, Hyperlipidemia, Hypertension, Hypertension associated with diabetes (HCC), OSA (obstructive sleep apnea), Other fatigue, Pain in both feet (11/15/2020), Pre-diabetes, Rheumatoid arthritis (HCC), Right-sided face pain (08/27/2018), Sleep apnea, SOB (shortness of breath) on exertion, Swelling of both lower extremities, and Vitamin D  deficiency. here with:  OSA   Reviewed the patient's sleep study with her. Unable to tolerate CPAP therapy. Advised that if she is unable to get a dental device she can call back and make an appointment with our office to discuss other treatment options.  Patient verbalized understanding. I did review risk associated with untreated sleep apnea.  She verbalized understanding. Follow-up as needed    Duwaine Russell, MSN, NP-C 01/14/2024, 8:25 AM [facility] [address] [place], [place] [address] [phone]     [1]  Allergies Allergen Reactions   Penicillins Rash   "

## 2024-01-14 NOTE — Patient Instructions (Signed)
 If you are unable to get a dental device please call our office to discuss other treatment options

## 2024-01-23 ENCOUNTER — Encounter: Payer: Self-pay | Admitting: Nurse Practitioner

## 2024-01-23 ENCOUNTER — Ambulatory Visit: Payer: Self-pay | Admitting: Nurse Practitioner

## 2024-01-23 ENCOUNTER — Ambulatory Visit: Admitting: Nurse Practitioner

## 2024-01-23 VITALS — BP 128/82 | HR 66 | Temp 97.8°F | Ht 64.0 in | Wt 287.0 lb

## 2024-01-23 DIAGNOSIS — E1169 Type 2 diabetes mellitus with other specified complication: Secondary | ICD-10-CM | POA: Diagnosis not present

## 2024-01-23 DIAGNOSIS — I10 Essential (primary) hypertension: Secondary | ICD-10-CM

## 2024-01-23 DIAGNOSIS — E785 Hyperlipidemia, unspecified: Secondary | ICD-10-CM

## 2024-01-23 DIAGNOSIS — Z7985 Long-term (current) use of injectable non-insulin antidiabetic drugs: Secondary | ICD-10-CM

## 2024-01-23 DIAGNOSIS — Z7984 Long term (current) use of oral hypoglycemic drugs: Secondary | ICD-10-CM

## 2024-01-23 DIAGNOSIS — E559 Vitamin D deficiency, unspecified: Secondary | ICD-10-CM | POA: Diagnosis not present

## 2024-01-23 DIAGNOSIS — L659 Nonscarring hair loss, unspecified: Secondary | ICD-10-CM | POA: Diagnosis not present

## 2024-01-23 DIAGNOSIS — E119 Type 2 diabetes mellitus without complications: Secondary | ICD-10-CM

## 2024-01-23 LAB — COMPREHENSIVE METABOLIC PANEL WITH GFR
ALT: 8 U/L (ref 3–35)
AST: 11 U/L (ref 5–37)
Albumin: 4.1 g/dL (ref 3.5–5.2)
Alkaline Phosphatase: 54 U/L (ref 39–117)
BUN: 12 mg/dL (ref 6–23)
CO2: 29 meq/L (ref 19–32)
Calcium: 9.1 mg/dL (ref 8.4–10.5)
Chloride: 103 meq/L (ref 96–112)
Creatinine, Ser: 0.77 mg/dL (ref 0.40–1.20)
GFR: 93.42 mL/min
Glucose, Bld: 82 mg/dL (ref 70–99)
Potassium: 3.9 meq/L (ref 3.5–5.1)
Sodium: 138 meq/L (ref 135–145)
Total Bilirubin: 0.5 mg/dL (ref 0.2–1.2)
Total Protein: 7.2 g/dL (ref 6.0–8.3)

## 2024-01-23 LAB — LIPID PANEL
Cholesterol: 151 mg/dL (ref 28–200)
HDL: 74.6 mg/dL
LDL Cholesterol: 63 mg/dL (ref 10–99)
NonHDL: 76.13
Total CHOL/HDL Ratio: 2
Triglycerides: 68 mg/dL (ref 10.0–149.0)
VLDL: 13.6 mg/dL (ref 0.0–40.0)

## 2024-01-23 LAB — VITAMIN D 25 HYDROXY (VIT D DEFICIENCY, FRACTURES): VITD: 35.22 ng/mL (ref 30.00–100.00)

## 2024-01-23 LAB — HEMOGLOBIN A1C: Hgb A1c MFr Bld: 5.3 % (ref 4.6–6.5)

## 2024-01-23 NOTE — Assessment & Plan Note (Signed)
 BP at goal with valsartan , amlodipine  and spironolactone . BP Readings from Last 3 Encounters:  01/23/24 128/82  11/08/23 128/82  07/26/23 120/80    Repeat CMP Maintain med doses F/up in 3months

## 2024-01-23 NOTE — Progress Notes (Signed)
 "  Acute Office Visit  Subjective:    Patient ID: Maria Quinn, female    DOB: 1980/02/24, 44 y.o.   MRN: [id number]  Chief Complaint  Patient presents with   Establish Care    Hair loss, med refill, DIABETES, HYPERTENSION, and hyperlipidemia f/up   HPI Loss of hair Chronic, reports gradual hair loss over several years, but worse in last 6months. Unable to get an appointment with [facility] dermatology till 06/2024 Previous labs checked within 6months: B12, iron, folate, vit. D, cbc, cmp, and Tsh. These are all normal.  Provided contact information for [facility] dermatology specialist and [facility] dermatology. Advised to call about a sooner appointment. Advised to maintain bariatric supplements as prescribed  Type 2 diabetes mellitus without complications (HCC) Current use of metformin  and mounjaro 10mg  prescried by bariatric clinic provider Metformin  dose maintained Last hgbA1c at 5.8% controlled  Normal UACr No statin at this time BP at goal  Repeat hgbA1c, CMP, lipid panel F/up in 3months  Hyperlipidemia associated with type 2 diabetes mellitus (HCC) Repeat lipid panel  Essential hypertension BP at goal with valsartan , amlodipine  and spironolactone . BP Readings from Last 3 Encounters:  01/23/24 128/82  11/08/23 128/82  07/26/23 120/80    Repeat CMP Maintain med doses F/up in 3months   Wt Readings from Last 3 Encounters:  01/23/24 287 lb (130.2 kg)  11/08/23 289 lb (131.1 kg)  07/26/23 (!) 304 lb 6.4 oz (138.1 kg)    Show/hide medication list[1]  Reviewed past medical and social history.  Review of Systems Per HPI     Objective:    Physical Exam Vitals and nursing note reviewed.  Cardiovascular:     Rate and Rhythm: Normal rate.     Pulses: Normal pulses.  Pulmonary:     Effort: Pulmonary effort is normal.  Musculoskeletal:     Right lower leg: No edema.     Left lower leg: No edema.  Neurological:     Mental Status: She is alert and oriented to  person, place, and time.     BP 128/82 (BP Location: Left Arm, Patient Position: Sitting)   Pulse 66   Temp 97.8 F (36.6 C) (Oral)   Ht 5' 4 (1.626 m)   Wt 287 lb (130.2 kg)   LMP 10/19/2010 Comment: No periods   SpO2 97%   BMI 49.26 kg/m    No results found for any visits on 01/23/24.      Assessment & Plan:   Problem List Items Addressed This Visit     Essential hypertension   BP at goal with valsartan , amlodipine  and spironolactone . BP Readings from Last 3 Encounters:  01/23/24 128/82  11/08/23 128/82  07/26/23 120/80    Repeat CMP Maintain med doses F/up in 3months      Relevant Orders   Comprehensive metabolic panel with GFR   Hyperlipidemia associated with type 2 diabetes mellitus (HCC)   Repeat lipid panel      Relevant Medications   MOUNJARO 12.5 MG/0.5ML Pen   Other Relevant Orders   Comprehensive metabolic panel with GFR   Lipid panel   Loss of hair - Primary   Chronic, reports gradual hair loss over several years, but worse in last 6months. Unable to get an appointment with [facility] dermatology till 06/2024 Previous labs checked within 6months: B12, iron, folate, vit. D, cbc, cmp, and Tsh. These are all normal.  Provided contact information for [facility] dermatology specialist and [facility] dermatology. Advised to call about a sooner appointment.  Advised to maintain bariatric supplements as prescribed      Type 2 diabetes mellitus without complications (HCC)   Current use of metformin  and mounjaro 10mg  prescried by bariatric clinic provider Metformin  dose maintained Last hgbA1c at 5.8% controlled  Normal UACr No statin at this time BP at goal  Repeat hgbA1c, CMP, lipid panel F/up in 3months      Relevant Medications   MOUNJARO 12.5 MG/0.5ML Pen   Other Relevant Orders   Hemoglobin A1c   Comprehensive metabolic panel with GFR   Lipid panel   Other Visit Diagnoses       Vitamin D  deficiency       Relevant Orders   VITAMIN D  25  Hydroxy (Vit-D Deficiency, Fractures)      No orders of the defined types were placed in this encounter.  Return in about 4 weeks (around 02/20/2024) for CPE (fasting).    Roselie Mood, NP     [1]  Outpatient Medications Prior to Visit  Medication Sig   amLODipine  (NORVASC ) 10 MG tablet Take 1 tablet (10 mg total) by mouth every evening.   benzonatate  (TESSALON ) 200 MG capsule Take 1 capsule (200 mg total) by mouth 3 (three) times daily as needed.   blood glucose meter kit and supplies Dispense based on patient and insurance preference. Use up to two times daily as directed. (FOR ICD-10 E10.9, E11.9). 1 year supplies   Cholecalciferol (VITAMIN D ) 50 MCG (2000 UT) CAPS Take by mouth daily.   meloxicam  (MOBIC ) 7.5 MG tablet Take 1 tablet by mouth once daily with food   metFORMIN  (GLUCOPHAGE ) 500 MG tablet Take 1 tablet by mouth once daily with breakfast   MOUNJARO 12.5 MG/0.5ML Pen Inject 12.5 mg into the skin once a week.   spironolactone  (ALDACTONE ) 25 MG tablet Take 1 tablet (25 mg total) by mouth daily.   triamcinolone  cream (KENALOG ) 0.1 % Apply 1 Application topically 2 (two) times daily.   valsartan  (DIOVAN ) 320 MG tablet Take 1 tablet (320 mg total) by mouth daily.   Vitamin D , Ergocalciferol , (DRISDOL ) 1.25 MG (50000 UNIT) CAPS capsule Take 50,000 Units by mouth once a week.   [DISCONTINUED] MOUNJARO 10 MG/0.5ML Pen Inject 10 mg into the skin once a week.   No facility-administered medications prior to visit.   "

## 2024-01-23 NOTE — Assessment & Plan Note (Signed)
 Current use of metformin  and mounjaro 10mg  prescried by bariatric clinic provider Metformin  dose maintained Last hgbA1c at 5.8% controlled  Normal UACr No statin at this time BP at goal  Repeat hgbA1c, CMP, lipid panel F/up in 3months

## 2024-01-23 NOTE — Assessment & Plan Note (Signed)
 Chronic, reports gradual hair loss over several years, but worse in last 6months. Unable to get an appointment with [facility] dermatology till 06/2024 Previous labs checked within 6months: B12, iron, folate, vit. D, cbc, cmp, and Tsh. These are all normal.  Provided contact information for [facility] dermatology specialist and [facility] dermatology. Advised to call about a sooner appointment. Advised to maintain bariatric supplements as prescribed

## 2024-01-23 NOTE — Assessment & Plan Note (Signed)
 Repeat lipid panel

## 2024-01-23 NOTE — Patient Instructions (Addendum)
 Go to lab Maintain Heart healthy diet and daily exercise. Maintain current medications.  Please contact [facility]: [address] [place], [phone] or  [facility] Specialist: [address], [phone].

## 2024-01-25 ENCOUNTER — Ambulatory Visit: Admitting: Nurse Practitioner

## 2024-02-11 ENCOUNTER — Encounter: Admitting: Nurse Practitioner

## 2024-02-13 ENCOUNTER — Telehealth: Payer: Self-pay | Admitting: Nurse Practitioner

## 2024-02-13 NOTE — Telephone Encounter (Signed)
 Copied from CRM [id number]. Topic: Clinical - Medication Refill >> Feb 13, 2024 11:44 AM China J wrote: Medication:  amLODipine  (NORVASC ) 10 MG tablet Vitamin D , Ergocalciferol , (DRISDOL ) 1.25 MG (50000 UNIT) CAPS capsule  Has the patient contacted their pharmacy? Yes (Agent: If no, request that the patient contact the pharmacy for the refill. If patient does not wish to contact the pharmacy document the reason why and proceed with request.) (Agent: If yes, when and what did the pharmacy advise?) Pharmacy instructed patient to call primary care.  This is the patient's preferred pharmacy:  [id number] [place], [place] - [address] [place] [address] Phone: [phone] Fax: [phone]  Is this the correct pharmacy for this prescription? Yes If no, delete pharmacy and type the correct one.   Has the prescription been filled recently? No  Is the patient out of the medication? Yes  Has the patient been seen for an appointment in the last year OR does the patient have an upcoming appointment? Yes  Can we respond through MyChart? Yes  Agent: Please be advised that Rx refills may take up to 3 business days. We ask that you follow-up with your pharmacy.

## 2024-02-14 NOTE — Telephone Encounter (Signed)
 Called patient to inform her that per Roselie she the higher does of the vitamin d  is no longer needed. She is to take 2000 international units daily. Patient stated that she has been taking the weekly and a daily dose of vitamin d . She asked that I ask Roselie because she has always been taking the weekly and daily does. I asked patient was she taking the daily and weekly dose when she had her labs drawn last month on the 14th. Patient stated that yes she was and that is why she wants me to ask Roselie about her only daily the daily vitamin d . I informed patient that I will send the Amlodip[facility]  [facility] on [address]. Patient stated that was correct.

## 2024-02-15 NOTE — Telephone Encounter (Signed)
 Patient called and informed

## 2024-03-14 ENCOUNTER — Encounter: Admitting: Nurse Practitioner

## 2024-04-02 ENCOUNTER — Encounter: Admitting: Nurse Practitioner
